# Patient Record
Sex: Female | Born: 1990
Health system: Southern US, Community
[De-identification: ages and names within clinical notes are randomized; demographics above are authoritative.]

## PROBLEM LIST (undated history)

## (undated) DIAGNOSIS — F411 Generalized anxiety disorder: Secondary | ICD-10-CM

## (undated) DIAGNOSIS — R350 Frequency of micturition: Secondary | ICD-10-CM

## (undated) DIAGNOSIS — F32A Depression, unspecified: Secondary | ICD-10-CM

## (undated) DIAGNOSIS — F329 Major depressive disorder, single episode, unspecified: Secondary | ICD-10-CM

## (undated) DIAGNOSIS — R351 Nocturia: Secondary | ICD-10-CM

## (undated) DIAGNOSIS — Z8619 Personal history of other infectious and parasitic diseases: Secondary | ICD-10-CM

## (undated) DIAGNOSIS — Z973 Presence of spectacles and contact lenses: Secondary | ICD-10-CM

## (undated) DIAGNOSIS — F419 Anxiety disorder, unspecified: Secondary | ICD-10-CM

## (undated) DIAGNOSIS — G35 Multiple sclerosis: Secondary | ICD-10-CM

## (undated) DIAGNOSIS — Z8742 Personal history of other diseases of the female genital tract: Secondary | ICD-10-CM

## (undated) DIAGNOSIS — N390 Urinary tract infection, site not specified: Secondary | ICD-10-CM

## (undated) DIAGNOSIS — D259 Leiomyoma of uterus, unspecified: Secondary | ICD-10-CM

## (undated) DIAGNOSIS — E282 Polycystic ovarian syndrome: Secondary | ICD-10-CM

## (undated) DIAGNOSIS — N941 Unspecified dyspareunia: Secondary | ICD-10-CM

## (undated) DIAGNOSIS — E559 Vitamin D deficiency, unspecified: Secondary | ICD-10-CM

## (undated) DIAGNOSIS — A749 Chlamydial infection, unspecified: Secondary | ICD-10-CM

## (undated) HISTORY — DX: Multiple sclerosis: G35

## (undated) HISTORY — DX: Major depressive disorder, single episode, unspecified: F32.9

## (undated) HISTORY — DX: Vitamin D deficiency, unspecified: E55.9

## (undated) HISTORY — DX: Anxiety disorder, unspecified: F41.9

## (undated) HISTORY — DX: Depression, unspecified: F32.A

## (undated) HISTORY — DX: Polycystic ovarian syndrome: E28.2

## (undated) HISTORY — DX: Chlamydial infection, unspecified: A74.9

## (undated) HISTORY — PX: NO PAST SURGERIES: SHX2092

## (undated) HISTORY — DX: Personal history of other diseases of the female genital tract: Z87.42

---
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
## 2014-01-23 ENCOUNTER — Ambulatory Visit: Payer: Self-pay | Admitting: Internal Medicine

## 2014-02-28 ENCOUNTER — Ambulatory Visit: Payer: Self-pay | Admitting: Emergency Medicine

## 2014-02-28 LAB — RAPID STREP-A WITH REFLX: Micro Text Report: POSITIVE

## 2015-03-04 DIAGNOSIS — G35 Multiple sclerosis: Secondary | ICD-10-CM

## 2015-03-04 HISTORY — DX: Multiple sclerosis: G35

## 2015-03-11 ENCOUNTER — Emergency Department (HOSPITAL_COMMUNITY)
Admission: EM | Admit: 2015-03-11 | Discharge: 2015-03-11 | Disposition: A | Discharge status: Home / Self Care | Payer: BLUE CROSS/BLUE SHIELD | Source: Home / Self Care | Attending: Family Medicine | Admitting: Family Medicine

## 2015-03-11 ENCOUNTER — Encounter (HOSPITAL_COMMUNITY): Payer: Self-pay | Admitting: Emergency Medicine

## 2015-03-11 DIAGNOSIS — R42 Dizziness and giddiness: Secondary | ICD-10-CM

## 2015-03-11 LAB — POCT I-STAT, CHEM 8
BUN: 7 mg/dL (ref 6–23)
CALCIUM ION: 1.23 mmol/L (ref 1.12–1.23)
CHLORIDE: 103 mmol/L (ref 96–112)
Creatinine, Ser: 0.7 mg/dL (ref 0.50–1.10)
GLUCOSE: 83 mg/dL (ref 70–99)
HEMATOCRIT: 44 % (ref 36.0–46.0)
Hemoglobin: 15 g/dL (ref 12.0–15.0)
Potassium: 3.3 mmol/L — ABNORMAL LOW (ref 3.5–5.1)
Sodium: 142 mmol/L (ref 135–145)
TCO2: 22 mmol/L (ref 0–100)

## 2015-03-11 LAB — POCT URINALYSIS DIP (DEVICE)
Bilirubin Urine: NEGATIVE
Glucose, UA: NEGATIVE mg/dL
Ketones, ur: NEGATIVE mg/dL
Leukocytes, UA: NEGATIVE
Nitrite: NEGATIVE
PROTEIN: NEGATIVE mg/dL
UROBILINOGEN UA: 0.2 mg/dL (ref 0.0–1.0)
pH: 7.5 (ref 5.0–8.0)

## 2015-03-11 LAB — POCT PREGNANCY, URINE: Preg Test, Ur: NEGATIVE

## 2015-03-11 NOTE — ED Notes (Signed)
C/o feeling light headed and dizzy onset Tuesday Sx also include HA and blurry vision [facility] her meclazine and zyrtec w/no relief Alert, no signs of acute distress.

## 2015-03-11 NOTE — Discharge Instructions (Signed)

## 2015-03-11 NOTE — ED Provider Notes (Signed)
CSN: [id number]     Arrival date & time 03/11/15  1206 History   First MD Initiated Contact with Patient 03/11/15 1258     Chief Complaint  Patient presents with  . Dizziness   (Consider location/radiation/quality/duration/timing/severity/associated sxs/prior Treatment) HPI      24 year old female presents complaining of lightheadedness and dizziness, and blurry vision with looking to the sides. This started on Tuesday, 3 days. Went to her school clinic yesterday and was prescribed meclizine and an allergy medicine, she took this last night and her symptoms were not totally gone this morning so she came in to be rechecked. She describes her symptoms as being very mild. She also occasionally gets a headache. She also admits to a significantly increased level of stress at school. No history of thyroid problems. She has a history of vertigo but that was much worse than she feels today. No SI or HI. No chest pain or shortness of breath  History reviewed. No pertinent past medical history. History reviewed. No pertinent past surgical history. No family history on file. History  Substance Use Topics  . Smoking status: Not on file  . Smokeless tobacco: Not on file  . Alcohol Use: Not on file   OB History    No data available     Review of Systems  Constitutional: Negative for fever and chills.  Eyes: Positive for visual disturbance. Negative for photophobia, pain and redness.  Respiratory: Negative for shortness of breath.   Cardiovascular: Negative for chest pain.  Gastrointestinal: Negative for nausea, vomiting and diarrhea.  Neurological: Positive for dizziness and headaches.  Psychiatric/Behavioral: The patient is nervous/anxious.   All other systems reviewed and are negative.   Allergies  Review of patient's allergies indicates no known allergies.  Home Medications   Prior to Admission medications   Medication Sig Start Date End Date Taking? Authorizing Provider  meclizine  (ANTIVERT) 12.5 MG tablet Take 12.5 mg by mouth 3 (three) times daily as needed for dizziness.   Yes Historical Provider, MD   BP 120/82 mmHg  Pulse 84  Temp(Src) 99.3 F (37.4 C) (Oral)  Resp 16  SpO2 100%  LMP 03/06/2015 Physical Exam  Constitutional: She is oriented to person, place, and time. Vital signs are normal. She appears well-developed and well-nourished. No distress.  HENT:  Head: Normocephalic and atraumatic.  Right Ear: External ear normal.  Left Ear: External ear normal.  Nose: Nose normal. Right sinus exhibits no maxillary sinus tenderness and no frontal sinus tenderness. Left sinus exhibits no maxillary sinus tenderness and no frontal sinus tenderness.  Mouth/Throat: Oropharynx is clear and moist. No oropharyngeal exudate.  Eyes: Conjunctivae are normal.  Neck: Normal range of motion. Neck supple.  Cardiovascular: Normal rate, regular rhythm and normal heart sounds.   Pulmonary/Chest: Effort normal and breath sounds normal. No respiratory distress.  Lymphadenopathy:    She has no cervical adenopathy.  Neurological: She is alert and oriented to person, place, and time. She has normal strength and normal reflexes. No cranial nerve deficit or sensory deficit. She exhibits normal muscle tone. She displays a negative Romberg sign. Coordination and gait normal.  Skin: Skin is warm and dry. No rash noted. She is not diaphoretic.  Psychiatric: She has a normal mood and affect. Judgment normal.  Nursing note and vitals reviewed.   ED Course  ED EKG  Date/Time: 03/11/2015 2:05 PM Performed by: Graylon Good Authorized by: Autumn Messing H Comparison: not compared with previous ECG  Rhythm: sinus rhythm  Rate: normal QRS axis: normal Conduction: conduction normal ST Segments: ST segments normal T Waves: T waves normal Other: no other findings Clinical impression: normal ECG   (including critical care time) Labs Review Labs Reviewed  POCT I-STAT, CHEM 8 -  Abnormal; Notable for the following:    Potassium 3.3 (*)    All other components within normal limits  POCT URINALYSIS DIP (DEVICE) - Abnormal; Notable for the following:    Hgb urine dipstick MODERATE (*)    All other components within normal limits    Imaging Review No results found.   MDM   1. Dizziness    The physical exam and workup here today is normal. I believe she should take the medication that she was prescribed yesterday. Also stress may be a concerning factor, we discussed strategies to deal with stress.    Graylon Good, PA-C 03/11/15 1405  Graylon Good, PA-C 03/11/15 [phone]

## 2015-03-12 ENCOUNTER — Encounter (HOSPITAL_COMMUNITY): Payer: Self-pay | Admitting: Emergency Medicine

## 2015-03-12 DIAGNOSIS — Z3202 Encounter for pregnancy test, result negative: Secondary | ICD-10-CM | POA: Insufficient documentation

## 2015-03-12 DIAGNOSIS — Z793 Long term (current) use of hormonal contraceptives: Secondary | ICD-10-CM | POA: Diagnosis not present

## 2015-03-12 DIAGNOSIS — H6692 Otitis media, unspecified, left ear: Secondary | ICD-10-CM | POA: Diagnosis not present

## 2015-03-12 DIAGNOSIS — R109 Unspecified abdominal pain: Secondary | ICD-10-CM | POA: Diagnosis not present

## 2015-03-12 DIAGNOSIS — Z79899 Other long term (current) drug therapy: Secondary | ICD-10-CM | POA: Insufficient documentation

## 2015-03-12 DIAGNOSIS — R42 Dizziness and giddiness: Secondary | ICD-10-CM | POA: Diagnosis present

## 2015-03-12 LAB — CBC WITH DIFFERENTIAL/PLATELET
BASOS PCT: 1 % (ref 0–1)
Basophils Absolute: 0.1 10*3/uL (ref 0.0–0.1)
EOS PCT: 3 % (ref 0–5)
Eosinophils Absolute: 0.2 10*3/uL (ref 0.0–0.7)
HEMATOCRIT: 39.2 % (ref 36.0–46.0)
Hemoglobin: 13.5 g/dL (ref 12.0–15.0)
Lymphocytes Relative: 31 % (ref 12–46)
Lymphs Abs: 3 10*3/uL (ref 0.7–4.0)
MCH: 31.3 pg (ref 26.0–34.0)
MCHC: 34.4 g/dL (ref 30.0–36.0)
MCV: 91 fL (ref 78.0–100.0)
Monocytes Absolute: 0.5 10*3/uL (ref 0.1–1.0)
Monocytes Relative: 5 % (ref 3–12)
NEUTROS ABS: 5.7 10*3/uL (ref 1.7–7.7)
Neutrophils Relative %: 60 % (ref 43–77)
PLATELETS: 270 10*3/uL (ref 150–400)
RBC: 4.31 MIL/uL (ref 3.87–5.11)
RDW: 12 % (ref 11.5–15.5)
WBC: 9.5 10*3/uL (ref 4.0–10.5)

## 2015-03-12 LAB — COMPREHENSIVE METABOLIC PANEL
ALT: 16 U/L (ref 0–35)
ANION GAP: 10 (ref 5–15)
AST: 19 U/L (ref 0–37)
Albumin: 3.8 g/dL (ref 3.5–5.2)
Alkaline Phosphatase: 39 U/L (ref 39–117)
BILIRUBIN TOTAL: 0.5 mg/dL (ref 0.3–1.2)
BUN: 8 mg/dL (ref 6–23)
CALCIUM: 9 mg/dL (ref 8.4–10.5)
CO2: 25 mmol/L (ref 19–32)
CREATININE: 0.73 mg/dL (ref 0.50–1.10)
Chloride: 104 mmol/L (ref 96–112)
GFR calc non Af Amer: >90 mL/min (ref >90)
GLUCOSE: 119 mg/dL — AB (ref 70–99)
Potassium: 3.1 mmol/L — ABNORMAL LOW (ref 3.5–5.1)
Sodium: 139 mmol/L (ref 135–145)
Total Protein: 6.4 g/dL (ref 6.0–8.3)

## 2015-03-12 LAB — LIPASE, BLOOD: LIPASE: 23 U/L (ref 11–59)

## 2015-03-12 NOTE — ED Notes (Signed)
Patient here with complaint of dizziness starting Tuesday of this week. States that she has had vertigo before and headaches. States that she went to urgent care yesterday for the dizziness, was told it is likely related to her allergies, and was discharged with meclizine. Reports that she took her doses today as prescribed with no relief. Dizziness is worsened by laying down, denies mitigating factors. Additionally reports diffuse abdominal accompanied by bright green bowel movement today.

## 2015-03-13 ENCOUNTER — Emergency Department (HOSPITAL_COMMUNITY)
Admission: EM | Admit: 2015-03-13 | Discharge: 2015-03-13 | Disposition: A | Discharge status: Home / Self Care | Payer: BLUE CROSS/BLUE SHIELD | Attending: Emergency Medicine | Admitting: Emergency Medicine

## 2015-03-13 DIAGNOSIS — H6692 Otitis media, unspecified, left ear: Secondary | ICD-10-CM

## 2015-03-13 LAB — URINALYSIS, ROUTINE W REFLEX MICROSCOPIC
Bilirubin Urine: NEGATIVE
Glucose, UA: NEGATIVE mg/dL
Hgb urine dipstick: NEGATIVE
Ketones, ur: NEGATIVE mg/dL
LEUKOCYTES UA: NEGATIVE
NITRITE: NEGATIVE
PH: 7.5 (ref 5.0–8.0)
PROTEIN: NEGATIVE mg/dL
Specific Gravity, Urine: 1.006 (ref 1.005–1.030)
Urobilinogen, UA: 0.2 mg/dL (ref 0.0–1.0)

## 2015-03-13 LAB — PREGNANCY, URINE: Preg Test, Ur: NEGATIVE

## 2015-03-13 MED ORDER — AMOXICILLIN 500 MG PO CAPS
500.0000 mg | ORAL_CAPSULE | Freq: Once | ORAL | Status: AC
  Administered 2015-03-13: 500 mg via ORAL
  Filled 2015-03-13: qty 1

## 2015-03-13 MED ORDER — AMOXICILLIN 500 MG PO CAPS: 500.0000 mg | ORAL_CAPSULE | Freq: Three times a day (TID) | ORAL | Status: DC

## 2015-03-13 MED ORDER — DIAZEPAM 5 MG PO TABS
5.0000 mg | ORAL_TABLET | Freq: Once | ORAL | Status: AC
Start: 2015-03-13 — End: 2015-03-13
  Administered 2015-03-13: 5 mg via ORAL
  Filled 2015-03-13: qty 1

## 2015-03-13 NOTE — Discharge Instructions (Signed)
Take amoxicillin as directed until gone. Refer to attached documents for more information.  °

## 2015-03-13 NOTE — ED Provider Notes (Signed)
CSN: [id number]     Arrival date & time 03/12/15  2152 History   First MD Initiated Contact with Patient 03/13/15 0032     Chief Complaint  Patient presents with  . Dizziness  . Abdominal Pain     (Consider location/radiation/quality/duration/timing/severity/associated sxs/prior Treatment) Patient is a 24 y.o. female presenting with dizziness. The history is provided by the patient. No language interpreter was used.  Dizziness Quality:  Lightheadedness and vertigo Severity:  Moderate Onset quality:  Gradual Duration:  5 days Timing:  Intermittent Progression:  Unchanged Chronicity:  New Context: bending over, ear pain and head movement   Context: not with loss of consciousness, not with physical activity, not when standing up and not when urinating   Relieved by:  Being still Worsened by:  Movement Ineffective treatments:  Medication Associated symptoms: no chest pain, no diarrhea, no nausea, no palpitations, no shortness of breath, no vomiting and no weakness   Risk factors: no anemia, no heart disease, no hx of stroke, no hx of vertigo, no Meniere's disease, no multiple medications and no new medications     History reviewed. No pertinent past medical history. History reviewed. No pertinent past surgical history. History reviewed. No pertinent family history. History  Substance Use Topics  . Smoking status: Never Smoker   . Smokeless tobacco: Not on file  . Alcohol Use: No   OB History    No data available     Review of Systems  Constitutional: Negative for fever, chills and fatigue.  HENT: Positive for ear pain. Negative for trouble swallowing.   Eyes: Negative for visual disturbance.  Respiratory: Negative for shortness of breath.   Cardiovascular: Negative for chest pain and palpitations.  Gastrointestinal: Negative for nausea, vomiting, abdominal pain and diarrhea.  Genitourinary: Negative for dysuria and difficulty urinating.  Musculoskeletal: Negative for  arthralgias and neck pain.  Skin: Negative for color change.  Neurological: Positive for dizziness and light-headedness. Negative for weakness.  Psychiatric/Behavioral: Negative for dysphoric mood.      Allergies  Review of patient's allergies indicates no known allergies.  Home Medications   Prior to Admission medications   Medication Sig Start Date End Date Taking? Authorizing Provider  drospirenone-ethinyl estradiol (YASMIN,ZARAH,SYEDA) 3-0.03 MG tablet Take 1 tablet by mouth daily.   Yes Historical Provider, MD  meclizine (ANTIVERT) 25 MG tablet Take 25 mg by mouth 3 (three) times daily as needed for dizziness.   Yes Historical Provider, MD   BP 110/76 mmHg  Pulse 93  Temp(Src) 98.3 F (36.8 C) (Oral)  Resp 27  SpO2 100%  LMP 03/06/2015 (Exact Date) Physical Exam  Constitutional: She is oriented to person, place, and time. She appears well-developed and well-nourished. No distress.  HENT:  Head: Normocephalic and atraumatic.  Right Ear: External ear normal.  Left Ear: External ear normal.  Mouth/Throat: Oropharynx is clear and moist. No oropharyngeal exudate.  Bilateral TM intact without erythema.   Eyes: Conjunctivae and EOM are normal.  Neck: Normal range of motion.  Cardiovascular: Normal rate and regular rhythm.  Exam reveals no gallop and no friction rub.   No murmur heard. Pulmonary/Chest: Effort normal and breath sounds normal. She has no wheezes. She has no rales. She exhibits no tenderness.  Abdominal: Soft. She exhibits no distension. There is no tenderness. There is no rebound.  Musculoskeletal: Normal range of motion.  Neurological: She is alert and oriented to person, place, and time. Coordination normal.  Speech is goal-oriented. Moves limbs without ataxia.  Skin: Skin is warm and dry.  Psychiatric: She has a normal mood and affect. Her behavior is normal.  Nursing note and vitals reviewed.   ED Course  Procedures (including critical care time) Labs  Review Labs Reviewed  COMPREHENSIVE METABOLIC PANEL - Abnormal; Notable for the following:    Potassium 3.1 (*)    Glucose, Bld 119 (*)    All other components within normal limits  LIPASE, BLOOD  CBC WITH DIFFERENTIAL/PLATELET  URINALYSIS, ROUTINE W REFLEX MICROSCOPIC  PREGNANCY, URINE    Imaging Review No results found.   EKG Interpretation None      MDM   Final diagnoses:  Acute left otitis media, recurrence not specified, unspecified otitis media type    3:10 AM Patient likely has otitis media on the left and will be discharged with amoxicillin. Labs and urinalysis unremarkable for acute changes. Vitals stable and patient afebrile. No abdominal pain at this time.    [address] [place], PA-C 03/13/15 [id number]  Loren Racer, MD 03/13/15 [phone]

## 2015-03-31 ENCOUNTER — Encounter (HOSPITAL_COMMUNITY): Payer: Self-pay | Admitting: Emergency Medicine

## 2015-03-31 ENCOUNTER — Telehealth (HOSPITAL_BASED_OUTPATIENT_CLINIC_OR_DEPARTMENT_OTHER): Payer: Self-pay | Admitting: Emergency Medicine

## 2015-03-31 ENCOUNTER — Emergency Department (HOSPITAL_COMMUNITY): Payer: BLUE CROSS/BLUE SHIELD

## 2015-03-31 ENCOUNTER — Emergency Department (HOSPITAL_COMMUNITY)
Admission: EM | Admit: 2015-03-31 | Discharge: 2015-03-31 | Disposition: A | Discharge status: Home / Self Care | Payer: BLUE CROSS/BLUE SHIELD | Attending: Emergency Medicine | Admitting: Emergency Medicine

## 2015-03-31 DIAGNOSIS — R51 Headache: Secondary | ICD-10-CM | POA: Insufficient documentation

## 2015-03-31 DIAGNOSIS — H9192 Unspecified hearing loss, left ear: Secondary | ICD-10-CM | POA: Insufficient documentation

## 2015-03-31 DIAGNOSIS — Z3202 Encounter for pregnancy test, result negative: Secondary | ICD-10-CM | POA: Diagnosis not present

## 2015-03-31 DIAGNOSIS — R200 Anesthesia of skin: Secondary | ICD-10-CM | POA: Diagnosis present

## 2015-03-31 DIAGNOSIS — G35 Multiple sclerosis: Secondary | ICD-10-CM | POA: Insufficient documentation

## 2015-03-31 DIAGNOSIS — Z79899 Other long term (current) drug therapy: Secondary | ICD-10-CM | POA: Insufficient documentation

## 2015-03-31 DIAGNOSIS — H938X3 Other specified disorders of ear, bilateral: Secondary | ICD-10-CM | POA: Insufficient documentation

## 2015-03-31 LAB — COMPREHENSIVE METABOLIC PANEL
ALBUMIN: 3.7 g/dL (ref 3.5–5.2)
ALK PHOS: 38 U/L — AB (ref 39–117)
ALT: 16 U/L (ref 0–35)
ANION GAP: 8 (ref 5–15)
AST: 20 U/L (ref 0–37)
CO2: 23 mmol/L (ref 19–32)
CREATININE: 0.7 mg/dL (ref 0.50–1.10)
Calcium: 9.3 mg/dL (ref 8.4–10.5)
Chloride: 109 mmol/L (ref 96–112)
Glucose, Bld: 94 mg/dL (ref 70–99)
POTASSIUM: 3.5 mmol/L (ref 3.5–5.1)
Sodium: 140 mmol/L (ref 135–145)
TOTAL PROTEIN: 6.6 g/dL (ref 6.0–8.3)
Total Bilirubin: 0.5 mg/dL (ref 0.3–1.2)

## 2015-03-31 LAB — URINALYSIS, ROUTINE W REFLEX MICROSCOPIC
Bilirubin Urine: NEGATIVE
GLUCOSE, UA: NEGATIVE mg/dL
Hgb urine dipstick: NEGATIVE
Ketones, ur: NEGATIVE mg/dL
LEUKOCYTES UA: NEGATIVE
NITRITE: NEGATIVE
Protein, ur: NEGATIVE mg/dL
SPECIFIC GRAVITY, URINE: 1.022 (ref 1.005–1.030)
Urobilinogen, UA: 0.2 mg/dL (ref 0.0–1.0)
pH: 8 (ref 5.0–8.0)

## 2015-03-31 LAB — CBC WITH DIFFERENTIAL/PLATELET
BASOS PCT: 1 % (ref 0–1)
Basophils Absolute: 0 10*3/uL (ref 0.0–0.1)
EOS PCT: 2 % (ref 0–5)
Eosinophils Absolute: 0.1 10*3/uL (ref 0.0–0.7)
HEMATOCRIT: 38.6 % (ref 36.0–46.0)
HEMOGLOBIN: 13.4 g/dL (ref 12.0–15.0)
LYMPHS ABS: 1.7 10*3/uL (ref 0.7–4.0)
Lymphocytes Relative: 28 % (ref 12–46)
MCH: 31.5 pg (ref 26.0–34.0)
MCHC: 34.7 g/dL (ref 30.0–36.0)
MCV: 90.6 fL (ref 78.0–100.0)
MONO ABS: 0.3 10*3/uL (ref 0.1–1.0)
MONOS PCT: 4 % (ref 3–12)
Neutro Abs: 4 10*3/uL (ref 1.7–7.7)
Neutrophils Relative %: 65 % (ref 43–77)
Platelets: 265 10*3/uL (ref 150–400)
RBC: 4.26 MIL/uL (ref 3.87–5.11)
RDW: 12.1 % (ref 11.5–15.5)
WBC: 6.1 10*3/uL (ref 4.0–10.5)

## 2015-03-31 LAB — TROPONIN I: Troponin I: <0.03 ng/mL (ref <0.031)

## 2015-03-31 LAB — POC URINE PREG, ED: PREG TEST UR: NEGATIVE

## 2015-03-31 LAB — LIPASE, BLOOD: Lipase: 26 U/L (ref 11–59)

## 2015-03-31 MED ORDER — GADOBENATE DIMEGLUMINE 529 MG/ML IV SOLN: 20.0000 mL | Freq: Once | INTRAVENOUS | Status: DC

## 2015-03-31 MED ORDER — SODIUM CHLORIDE 0.9 % IV BOLUS (SEPSIS)
1000.0000 mL | Freq: Once | INTRAVENOUS | Status: AC
  Administered 2015-03-31: 1000 mL via INTRAVENOUS

## 2015-03-31 NOTE — ED Notes (Signed)
Patient transported to MRI 

## 2015-03-31 NOTE — Discharge Instructions (Signed)
Please call your doctor for a followup appointment within 24-48 hours. When you talk to your doctor please let them know that you were seen in the emergency department and have them acquire all of your records so that they can discuss the findings with you and formulate a treatment plan to fully care for your new and ongoing problems. Please call and set-up an appointment with health and wellness center Please call and set-up an appointment with Neurology Please rest and stay hydrated Please continue to monitor symptoms closely and if symptoms are to worsen or change (fever greater than 101, chills, sweating, nausea, vomiting, chest pain, shortness of breathe, difficulty breathing, weakness, numbness, tingling, worsening or changes to pain pattern, swelling, fall, inability to walk, visual changes) please report back to the Emergency Department immediately.    Multiple Sclerosis Multiple sclerosis (MS) is a disease of the central nervous system. It leads to the loss of the insulating covering of the nerves (myelin sheath) of your brain. When this happens, brain signals do not get sent properly or may not get sent at all. The age of onset of MS varies.  CAUSES The cause of MS is unknown. However, it is more common in the [place] than in the [place]. RISK FACTORS There is a higher number of women with MS than men. MS is not an illness that is passed down to you from your family members (inherited). However, your risk of MS is higher if you have a relative with MS. SIGNS AND SYMPTOMS  The symptoms of MS occur in episodes or attacks. These attacks may last weeks to months. There may be Josten periods of almost no symptoms between attacks. The symptoms of MS vary. This is because of the many different ways it affects the central nervous system. The main symptoms of MS include:  Vision problems and eye pain.  Numbness.  Weakness.  Inability to move your arms, hands, feet,  or legs (paralysis).  Balance problems.  Tremors. DIAGNOSIS  Your health care provider can diagnose MS with the help of imaging exams and lab tests. These may include specialized X-ray exams and spinal fluid tests. The best imaging exam to confirm a diagnosis of MS is an MRI. TREATMENT  There is no known cure for MS, but there are medicines that can decrease the number and frequency of attacks. Steroids are often used for short-term relief. Physical and occupational therapy may also help. There are also many new alternative or complementary treatments available to help control the symptoms of MS. Ask your health care provider if any of these other options are right for you. HOME CARE INSTRUCTIONS   Take medicines as directed by your health care provider.  Exercise as directed by your health care provider. SEEK MEDICAL CARE IF: You begin to feel depressed. SEEK IMMEDIATE MEDICAL CARE IF:  You develop paralysis.  You have problems with bladder, bowel, or sexual function.  You develop mental changes, such as forgetfulness or mood swings.  You have a period of uncontrolled movements (seizure). Document Released: 11/16/2000 Document Revised: 11/24/2013 Document Reviewed: 07/27/2013 [facility] Patient Information 2015 [place], [place]. This information is not intended to replace advice given to you by your health care provider. Make sure you discuss any questions you have with your health care provider.   Emergency Department Resource Guide 1) Find a Doctor and Pay Out of Pocket Although you won't have to find out who is covered by your insurance plan, it is a  good idea to ask around and get recommendations. You will then need to call the office and see if the doctor you have chosen will accept you as a new patient and what types of options they offer for patients who are self-pay. Some doctors offer discounts or will set up payment plans for their patients who do not have insurance, but you  will need to ask so you aren't surprised when you get to your appointment.  2) Contact Your Local Health Department Not all health departments have doctors that can see patients for sick visits, but many do, so it is worth a call to see if yours does. If you don't know where your local health department is, you can check in your phone book. The CDC also has a tool to help you locate your state's health department, and many state websites also have listings of all of their local health departments.  3) Find a Walk-in Clinic If your illness is not likely to be very severe or complicated, you may want to try a walk in clinic. These are popping up all over the country in pharmacies, drugstores, and shopping centers. They're usually staffed by nurse practitioners or physician assistants that have been trained to treat common illnesses and complaints. They're usually fairly quick and inexpensive. However, if you have serious medical issues or chronic medical problems, these are probably not your best option.  No Primary Care Doctor: - Call Health Connect at  [phone] - they can help you locate a primary care doctor that  accepts your insurance, provides certain services, etc. - Physician Referral Service- [phone]  Chronic Pain Problems: Organization         Address  Phone   Notes  [facility]  [phone] Patients need to be referred by their primary care doctor.   Medication Assistance: Organization         Address  Phone   Notes  [facility] Medication [facility] [address] [place], [place] [address] [phone] --Must be a resident of [facility] -- Must have NO insurance coverage whatsoever (no Medicaid/ Medicare, etc.) -- The pt. MUST have a primary care doctor that directs their care regularly and follows them in the community   [facility]  [phone]   [employer]  [phone]    Agencies that provide inexpensive medical  care: Organization         Address  Phone   Notes  [facility]  [phone]   [facility]    [phone]   [facility] [address] [place], [place] [address] [phone]   [facility] [address] [place], [place] [phone]   Planned Parenthood    [phone]   [facility]    [phone]   [facility] and [facility]  [address], [place] Phone:  [phone], Fax:  [phone] Hours of Operation:  9 am - 6 pm, M-F.  Also accepts Medicaid/Medicare and self-pay.  [facility]  [address], [place] Phone: [phone], Fax: [phone]. Hours of Operation:  8:30 am - 5:30 pm, M-F.  Also accepts Medicaid and self-pay.  [facility] [place] [address] [place] Phone: [phone]   [facility] [address] Natasha Bence [place], [place] [phone], Ext. [phone] Mondays & Thursdays: 7-9 AM.  First  15 patients are seen on a first come, first serve basis.    Medicaid-accepting [facility] Providers:  Organization         Address  Phone   Notes  [facility] [address], [place] [phone] Also accepts self-pay patients.  [facility] [address] Laurell Josephs [place], [place]  [phone]   [facility] [address], [place] [phone]   [facility] 352 Family Medicine [address], [place] [phone]   Renaye Rakers [address], [place]   [phone] Only accepts [place] Access [place] patients after they have their name applied to their card.   Self-Pay (no insurance) in [facility]:  Organization         Address  Phone   Notes  Sickle Cell Patients, [facility] Internal Medicine [address] [place], [place] [phone]   [facility] [address] [place],  [place] [phone]   [facility] [place]  [address], [place] [phone]   [facility]/Dr. Osei-Bonsu  [address], [place] or [address], [place] [phone] Phone number for both [place] and [place] locations is the same.  [facility] and [facility] [address] [place] [phone]   [facility] [address] [place] or [address] [phone]   [facility] [address] [place] [phone], phone; [phone], fax Sees patients 1st and 3rd Saturday of every month.  Must not qualify for public or private insurance (i.e. Medicaid, Medicare, [place] Health Choice, Veterans' Benefits)  Household income should be no more than 200% of the poverty level The clinic cannot treat you if you are pregnant or think you are pregnant  Sexually transmitted diseases are not treated at the clinic.    Dental Care: Organization         Address  Phone  Notes  [facility] Department of [facility] [address] [place], [place] [phone] Accepts children up to age 63 who are enrolled in [place] or [place] Health Choice; pregnant women with a Medicaid card; and children who have applied for Medicaid or [place] Health Choice, but were declined, whose parents can pay a reduced fee at time of service.  [facility] Department of [facility]  [address], [place] [phone] Accepts children up to age 39 who are enrolled in [place] or [place] Health Choice; pregnant women with a Medicaid card; and children who have applied for Medicaid or [place] Health Choice, but were declined, whose parents can pay a reduced fee at time of service.  [facility]  [address] [place], [place] [phone] Patients are seen by appointment only. Walk-ins are not accepted. [facility] will see patients 40 years of age and older. Monday - Tuesday (8am-5pm) Most Wednesdays (8:30-5pm) $30 per visit, cash only  [facility]  [address], [facility] [phone] Patients are seen by appointment only. Walk-ins are not accepted. [facility] will see patients 58 years of age and older. One Wednesday Evening (Monthly: Volunteer Based).  $30 per visit, cash only  [employer]  [phone] for adults; Children under age 2, call Graduate Pediatric  Dentistry at [phone]. Children aged 46-14, please call [phone] to request a pediatric application.  Dental services are provided in all areas of dental care including fillings, crowns and bridges, complete and partial dentures, implants, gum treatment, root canals, and extractions. Preventive care is also provided. Treatment is provided to both adults and children. Patients are selected via a lottery and there is often a waiting list.   [facility] [address], [place]  [phone] [email or web address]   [facility] [address] [place], [place] [phone], Ext. [phone] Second and Fourth Thursday of each month, opens at 6:30 AM; Clinic ends at 9 AM.  Patients are seen on a first-come first-served basis, and a limited number are seen during each clinic.   [facility]  [address] Ether Griffins [place], [place] [phone]   Eligibility Requirements You must have lived in [place], [place], or [place] counties for at least the last three months.   You cannot be eligible for state or federal sponsored [place], including [employer], [place], or [employer].   You generally cannot be eligible for healthcare insurance through your employer.    How to apply: Eligibility screenings are held every Tuesday and Wednesday afternoon from 1:00 pm until 4:00 pm. You do not need an appointment for the interview!   [facility] [address] [place], [place] [phone]   [facility] Health Department  [phone]   [facility] Health Department  [phone]   [facility] Health Department  [phone]    Behavioral Health Resources in the Community: Intensive Outpatient Programs Organization         Address  Phone  Notes  [facility] [address] [place], [place] [phone]   [facility] Outpatient [address], [place], [place] [phone]   [facility] [address] [place], [place]  [phone]   [facility] Mental Health [address],  [place], [place] [phone] or [phone]   Substance Abuse Resources Organization         Address  Phone  Notes  [facility] Drug [facility]  [phone]   [facility]  [phone]   [facility]  [phone]   Floydene Flock  [phone]   Residential & Outpatient Substance Abuse Program  [phone]   Psychological Services Organization         Address  Phone  Notes  [facility] Behavioral Health  [phone]   [facility]  [phone]   [facility] Mental Health [address] [place] [phone] or [phone]    Mobile Crisis Teams Organization         Address  Phone  Notes  [facility], Mobile Crisis Care Unit  [phone]   [facility]  [address] [place], [place] [phone]   Doristine Locks [address] [place] [phone]    Self-Help/Support Groups Organization         Address  Phone             Notes  [facility] [place] - variety of support groups  336- I7437963 Call for more information  Narcotics Anonymous (NA), [facility] [address], [employer] [place]  2 meetings at this location   Statistician         Address  Phone  Notes  [facility] [address],  [place]  [place]  [address] [place] [id number], [place], [place]   [place], [place] [phone] Admissions: 8am-3pm M-F  Incentives Substance [place] [address]    [place], [place] [phone]   [facility] [address] [place], [place], [place]   [facility] [address],  [place], [place]   [facility] - Intensive Outpatient [address] Kristeen [place] [address], [place], [place]   [facility] [place].,  [place], [place] [phone] or [phone]   [facility] ([facility]) [address], [place], [place] Accepts Medicaid  [facility] [place] [address],  [place] [phone] Substance Abuse/Addiction Treatment   [facility] Organization         Address  Phone  Notes  [facility]  7571055668   Domenic Schwab, PhD [address] Arlis [place], [place]   [phone] or [phone]   [place] [address] [place], [place] [phone]   [facility] [address], [place], [place] [phone] Insurance/Medicaid/sponsorship through [facility] and Families [address] [place]                                    [place], [place] [phone] [place] [address], [place] [phone]    Dr. Adele Schilder  [phone]   [facility] Dept. 1) [address] [place] 2) [place] 3)  [place] [address], [place] [phone]  [phone]   [place] [phone] or [phone] (After Hours)

## 2015-03-31 NOTE — ED Provider Notes (Signed)
CSN: [id number]     Arrival date & time 03/31/15  0932 History   First MD Initiated Contact with Patient 03/31/15 [phone]     Chief Complaint  Patient presents with  . Numbness  . Ear Fullness     (Consider location/radiation/quality/duration/timing/severity/associated sxs/prior Treatment) The history is provided by the patient. No language interpreter was used.  Tracey Lindsey is a 24 y/o F with no known significant PMHx presenting to the ED with left ear fullness and numbness. Patient reported that 1 week ago she started to experience dizziness and was seen in the ED, diagnosed with otitis media where she was treated with amoxicillin. Stated that after taking the amoxicillin she started to noticed that her ear and her dizziness subsided. Patient reported that then she started to notice the dizziness throughout the past couple of days, stated that when she walks at times she does feel off balance. Reported that 6 days ago she started to notice left ear fullness and intermittent episodes of discomfort that were subtle. Reported that 3 days ago she noticed numbness and paresthesias within her left foot at approximately 8:00PM. Reported that next morning her left hand was numb and paresthesias were present. Reported that her left leg began numb with paresthesias this morning. Patient reported that she became concerned and wanted to come in and be assess. Reported that her last menstrual period was last month. Denied blurred vision, sudden loss of vision, difficulty swallowing, neck pain, neck stiffness, fever, chills, chest pain, shortness of breath, difficulty breathing, hemoptysis, cough, nasal congestion, urinary and bowel incontinence, back pain, confusion, speech issues, nausea, vomiting, diarrhea, melena, hematochezia, urinary symptoms, thumb pain, head injury, travels, leg swelling, change to medication. PCP none  History reviewed. No pertinent past medical history. History reviewed. No pertinent  past surgical history. History reviewed. No pertinent family history. History  Substance Use Topics  . Smoking status: Never Smoker   . Smokeless tobacco: Not on file  . Alcohol Use: No   OB History    No data available     Review of Systems  Constitutional: Negative for fever and chills.  HENT: Positive for hearing loss (left). Negative for sore throat and trouble swallowing.   Eyes: Negative for visual disturbance.  Respiratory: Negative for chest tightness and shortness of breath.   Cardiovascular: Negative for chest pain.  Gastrointestinal: Negative for nausea, vomiting and abdominal pain.  Musculoskeletal: Negative for back pain, neck pain and neck stiffness.  Neurological: Positive for dizziness, numbness and headaches. Negative for speech difficulty and weakness.      Allergies  Review of patient's allergies indicates no known allergies.  Home Medications   Prior to Admission medications   Medication Sig Start Date End Date Taking? Authorizing Provider  budesonide (RHINOCORT AQUA) 32 MCG/ACT nasal spray Place 2 sprays into both nostrils daily.   Yes Historical Provider, MD  drospirenone-ethinyl estradiol (YASMIN,ZARAH,SYEDA) 3-0.03 MG tablet Take 1 tablet by mouth daily.   Yes Historical Provider, MD  fluticasone (CUTIVATE) 0.05 % cream Apply 1 application topically daily as needed.   Yes Historical Provider, MD  loratadine (CLARITIN) 10 MG tablet Take 10 mg by mouth daily.   Yes Historical Provider, MD  meclizine (ANTIVERT) 25 MG tablet Take 25 mg by mouth 3 (three) times daily as needed for dizziness.    Historical Provider, MD   BP 124/75 mmHg  Pulse 73  Temp(Src) 98.4 F (36.9 C) (Oral)  Resp 17  Wt 200 lb (90.719 kg)  SpO2  100%  LMP 03/06/2015 (Exact Date) Physical Exam  Constitutional: She is oriented to person, place, and time. She appears well-developed and well-nourished. No distress.  HENT:  Head: Normocephalic and atraumatic.  Right Ear: Hearing,  tympanic membrane, external ear and ear canal normal. No mastoid tenderness.  Left Ear: Hearing, tympanic membrane, external ear and ear canal normal. No mastoid tenderness.  Mouth/Throat: Oropharynx is clear and moist. No oropharyngeal exudate.  Eyes: Conjunctivae and EOM are normal. Pupils are equal, round, and reactive to light. Right eye exhibits no discharge. Left eye exhibits no discharge.  Neck: Normal range of motion. Neck supple. No tracheal deviation present.  Cardiovascular: Normal rate, regular rhythm and normal heart sounds.  Exam reveals no friction rub.   No murmur heard. Pulmonary/Chest: Effort normal and breath sounds normal. No respiratory distress. She has no wheezes. She has no rales.  Musculoskeletal: Normal range of motion.  Full ROM to upper and lower extremities without difficulty noted, negative ataxia noted.  Lymphadenopathy:    She has no cervical adenopathy.  Neurological: She is alert and oriented to person, place, and time. No cranial nerve deficit. She exhibits normal muscle tone. Coordination normal. GCS eye subscore is 4. GCS verbal subscore is 5. GCS motor subscore is 6.  Reflex Scores:      Patellar reflexes are 2+ on the right side and 2+ on the left side. Cranial nerves III-XII grossly intact Strength 5+/5+ to upper and lower extremities bilaterally with resistance applied, equal distribution noted Equal grip strength  Negative facial droop  Negative slurred speech  Negative aphasia Patient is able to bring finger to nose bilaterally without difficulty or ataxia Negative arm drift Fine motor skills intact Patient is able to walk on toes, heel to toe Gait proper, proper balance - negative sway, negative drift, negative step-offs  Skin: Skin is warm and dry. No rash noted. She is not diaphoretic. No erythema.  Psychiatric: She has a normal mood and affect. Her behavior is normal. Thought content normal.  Vitals reviewed.   ED Course  Procedures  (including critical care time)  Results for orders placed or performed during the hospital encounter of 03/31/15  CBC with Differential/Platelet  Result Value Ref Range   WBC 6.1 4.0 - 10.5 K/uL   RBC 4.26 3.87 - 5.11 MIL/uL   Hemoglobin 13.4 12.0 - 15.0 g/dL   HCT 16.1 09.6 - 04.5 %   MCV 90.6 78.0 - 100.0 fL   MCH 31.5 26.0 - 34.0 pg   MCHC 34.7 30.0 - 36.0 g/dL   RDW 40.9 81.1 - 91.4 %   Platelets 265 150 - 400 K/uL   Neutrophils Relative % 65 43 - 77 %   Neutro Abs 4.0 1.7 - 7.7 K/uL   Lymphocytes Relative 28 12 - 46 %   Lymphs Abs 1.7 0.7 - 4.0 K/uL   Monocytes Relative 4 3 - 12 %   Monocytes Absolute 0.3 0.1 - 1.0 K/uL   Eosinophils Relative 2 0 - 5 %   Eosinophils Absolute 0.1 0.0 - 0.7 K/uL   Basophils Relative 1 0 - 1 %   Basophils Absolute 0.0 0.0 - 0.1 K/uL  Comprehensive metabolic panel  Result Value Ref Range   Sodium 140 135 - 145 mmol/L   Potassium 3.5 3.5 - 5.1 mmol/L   Chloride 109 96 - 112 mmol/L   CO2 23 19 - 32 mmol/L   Glucose, Bld 94 70 - 99 mg/dL   BUN <5 (L) 6 -  23 mg/dL   Creatinine, Ser 3.16 0.50 - 1.10 mg/dL   Calcium 9.3 8.4 - 74.2 mg/dL   Total Protein 6.6 6.0 - 8.3 g/dL   Albumin 3.7 3.5 - 5.2 g/dL   AST 20 0 - 37 U/L   ALT 16 0 - 35 U/L   Alkaline Phosphatase 38 (L) 39 - 117 U/L   Total Bilirubin 0.5 0.3 - 1.2 mg/dL   GFR calc non Af Amer >90 >90 mL/min   GFR calc Af Amer >90 >90 mL/min   Anion gap 8 5 - 15  Lipase, blood  Result Value Ref Range   Lipase 26 11 - 59 U/L  Urinalysis, Routine w reflex microscopic  Result Value Ref Range   Color, Urine YELLOW YELLOW   APPearance CLEAR CLEAR   Specific Gravity, Urine 1.022 1.005 - 1.030   pH 8.0 5.0 - 8.0   Glucose, UA NEGATIVE NEGATIVE mg/dL   Hgb urine dipstick NEGATIVE NEGATIVE   Bilirubin Urine NEGATIVE NEGATIVE   Ketones, ur NEGATIVE NEGATIVE mg/dL   Protein, ur NEGATIVE NEGATIVE mg/dL   Urobilinogen, UA 0.2 0.0 - 1.0 mg/dL   Nitrite NEGATIVE NEGATIVE   Leukocytes, UA NEGATIVE  NEGATIVE  Troponin I  Result Value Ref Range   Troponin I <0.03 <0.031 ng/mL  POC urine preg, ED (not at [facility])  Result Value Ref Range   Preg Test, Ur NEGATIVE NEGATIVE    Labs Review Labs Reviewed  COMPREHENSIVE METABOLIC PANEL - Abnormal; Notable for the following:    BUN <5 (*)    Alkaline Phosphatase 38 (*)    All other components within normal limits  CBC WITH DIFFERENTIAL/PLATELET  LIPASE, BLOOD  URINALYSIS, ROUTINE W REFLEX MICROSCOPIC  TROPONIN I  POC URINE PREG, ED    Imaging Review Mr Laqueta Jean Wo Contrast  03/31/2015   CLINICAL DATA:  Numbness, ear fullness, lightheadedness, dizziness, and blurred vision. Symptoms present for 3 days. Mild headache.  EXAM: MRI HEAD WITHOUT AND WITH CONTRAST  TECHNIQUE: Multiplanar, multiecho pulse sequences of the brain and surrounding structures were obtained without and with intravenous contrast.  CONTRAST:  20 mL MultiHance  COMPARISON:  None.  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 There is no evidence of acute infarct, intracranial hemorrhage midline shift, or extra-axial fluid collection. Ventricles and sulci are normal.  There are multiple small foci of T2 hyperintensity in the cerebral white matter bilaterally, greatest in the periventricular white matter and with involvement of the posterior body/ splenium of the corpus callosum on the left. Multiple small T2 hyperintense lesions are also present in the brainstem, predominantly in the dorsal pons, with involvement of the middle cerebellar peduncles as well. There are 2 small, ring-enhancing lesions in the pons, and there is a 3 mm solidly enhancing lesion in the right middle cerebellar peduncle. There is also minimal enhancement of a left corpus callosum/ periventricular lesion (series 11, image 31).  Orbits are unremarkable. Mastoid air  cells are clear. There is minimal bilateral ethmoid air cell mucosal thickening. Major intracranial vascular flow voids are preserved.  IMPRESSION: Multiple supratentorial and infratentorial white matter lesions, concerning for demyelinating disease (multiple sclerosis). Several lesions demonstrate enhancement, suggestive of active demyelination.   Electronically Signed   By: Sebastian Ache   On: 03/31/2015 13:14     EKG Interpretation   Date/Time:  Thursday March 31 2015  10:31:34 EDT Ventricular Rate:  86 PR Interval:  142 QRS Duration: 70 QT Interval:  364 QTC Calculation: 435 R Axis:   51 Text Interpretation:  Sinus rhythm Confirmed by OTTER  MD, OLGA ([id number]) on  03/31/2015 1:49:14 PM       Orthostatic VS for the past 24 hrs:  BP- Lying Pulse- Lying BP- Sitting Pulse- Sitting BP- Standing at 0 minutes Pulse- Standing at 0 minutes  03/31/15 1036 115/77 mmHg 88 126/79 mmHg 87 128/85 mmHg 95    1:32 PM This provider spoke with Dr. Roseanne Reno, Neurology. Discussed case, labs, imaging, ED course, physical exam in great detail. As per physician, reported that patient does not need prednisone at this time, recommended that patient can be discharged home with Neurology follow up.   2:03 PM This provider had a [place] discussion with patient and family at bedside - patient reported that it is okay for parents to know labs and results. Answered questions for patient. Discussed plan for discharge and for patient to follow-up with neurology. Reported that patient does not need to be admitted for this at this time.   MDM   Final diagnoses:  Multiple sclerosis    Medications  gadobenate dimeglumine (MULTIHANCE) injection 20 mL (not administered)  sodium chloride 0.9 % bolus 1,000 mL (1,000 mLs Intravenous New Bag/Given 03/31/15 1040)    Filed Vitals:   03/31/15 1045 03/31/15 1100 03/31/15 1122 03/31/15 1317  BP: 116/76 121/71 123/68 124/75  Pulse: 82 82 93 73  Temp:      TempSrc:      Resp: Weight:      SpO2: 100% 100% 100% 100%   This provider viewed the patient's chart. Patient was seen at urgent care Center on 03/11/2015 regarding vertigo-was recommended continue with meclizine that school gave patient. Patient presenting to the ED on 03/13/2015 regarding left ear fullness, pain and dizziness-patient was found to have a left otitis media was started on amoxicillin. EKG noted normal sinus rhythm with a heart rate of 86 bpm. Troponin negative elevation. CBC negative elevated leukocytosis. Hemoglobin 13.4, hematocrit 30.6. CMP unremarkable. Lipase negative elevation. Urine pregnancy negative. Urinalysis negative hemoglobin, nitrites, leukocytes-negative findings of infection. MRI of brain identified multiple supratentorial and infratentorial white matter lesions concerning for MS, suggestive of active demyelination.  Orthostatics unremarkable. Patient given IV fluids in ED setting. MRI of brain identified MS. Neurology, Dr. Roseanne Reno, consulted-discussed case in great detail-reported that patient can follow-up as an outpatient did not recommend prednisone at this time. Discussed imaging and results with patient and family at bedside in great detail. Patient stable, afebrile. Patient not septic appearing. Negative signs of respiratory distress. Discharged patient. [facility] Discussed with patient to rest and stay hydrated. Discussed with patient to closely monitor symptoms and if symptoms are to worsen or change to report back to the ED - strict return instructions given.  Patient agreed to plan of care, understood, all questions answered.   Raymon Mutton, PA-C 03/31/15 1410  Marisa Severin, MD 03/31/15 [phone]

## 2015-03-31 NOTE — ED Notes (Signed)
PT was recently tx for inner ear infection; still feels muffled and full. Also concerned for foot, hand, leg numbness. Started in her left foot and now progressing to leg and hand. All on left side. Denies slurred speech or other deficits. Foot numbness started 2 days ago at 6pm; hand started at 0800 yesterday. Leg numbness this morning. No neurological hx.

## 2015-04-01 ENCOUNTER — Telehealth: Payer: Self-pay | Admitting: Neurology

## 2015-04-01 NOTE — Telephone Encounter (Signed)
Dr. Sherie Don called back, the patient does have some balance issues, she has a positive Romberg, we will go ahead and start prednisone, 10 mg 12 day pack.

## 2015-04-01 NOTE — Telephone Encounter (Signed)
I got a call from Dr. Sherie Don. The patient has had a several day history of primarily left-sided sensory symptoms involving the leg, arm, and left face and ear. The patient had transient problems with some visual blurring when looking to the left. This visual issue has improved. MRI the brain was done yesterday showing evidence of what appears to be multiple sclerosis. The patient will be seeing Dr. Epimenio Foot in the next 2 days. I would not recommend starting steroids for a pure sensory event, but if the symptoms worsen, the patient is to contact our office, we may get steroids started. There is no weakness, gait disturbance, or visual loss.

## 2015-04-04 ENCOUNTER — Ambulatory Visit (INDEPENDENT_AMBULATORY_CARE_PROVIDER_SITE_OTHER): Payer: BLUE CROSS/BLUE SHIELD | Admitting: Neurology

## 2015-04-04 ENCOUNTER — Encounter: Payer: Self-pay | Admitting: Neurology

## 2015-04-04 ENCOUNTER — Telehealth: Payer: Self-pay | Admitting: Neurology

## 2015-04-04 VITALS — BP 138/86 | HR 76 | Resp 14 | Ht 62.0 in | Wt 194.2 lb

## 2015-04-04 DIAGNOSIS — R260 Ataxic gait: Secondary | ICD-10-CM | POA: Diagnosis not present

## 2015-04-04 DIAGNOSIS — E559 Vitamin D deficiency, unspecified: Secondary | ICD-10-CM

## 2015-04-04 DIAGNOSIS — G35 Multiple sclerosis: Secondary | ICD-10-CM | POA: Diagnosis not present

## 2015-04-04 DIAGNOSIS — R42 Dizziness and giddiness: Secondary | ICD-10-CM

## 2015-04-04 DIAGNOSIS — R5383 Other fatigue: Secondary | ICD-10-CM | POA: Diagnosis not present

## 2015-04-04 DIAGNOSIS — R200 Anesthesia of skin: Secondary | ICD-10-CM | POA: Diagnosis not present

## 2015-04-04 DIAGNOSIS — G4700 Insomnia, unspecified: Secondary | ICD-10-CM | POA: Diagnosis not present

## 2015-04-04 DIAGNOSIS — F411 Generalized anxiety disorder: Secondary | ICD-10-CM | POA: Diagnosis not present

## 2015-04-04 MED ORDER — LORAZEPAM 0.5 MG PO TABS: 0.5000 mg | ORAL_TABLET | Freq: Two times a day (BID) | ORAL | Status: DC | PRN

## 2015-04-04 NOTE — Telephone Encounter (Signed)
Closed in error. Please see below.  

## 2015-04-04 NOTE — Progress Notes (Signed)
[facility]  PATIENT: Tracey Lindsey DOB: 21-Mar-1991  REFERRING DOCTOR OR PCP:  Luan Pulling Corbin Ade) SOURCE: Patient and ER records and PACS images  _________________________________   HISTORICAL  CHIEF COMPLAINT:  Chief Complaint  Patient presents with  . Numbness    Sts. onset 6 weeks ago of dizziness, fatigue, then onset 1 week ago of numbness that started in left foot and progressed upward--now entire left side is numb.  Left ear feels "clogged."  She was seen at [facility] ED and after an mri brain, sts. was told she has MS and was told to f/u with neuro.  She is currently taking a Prednisone  taper pk.Chucky May  . Fatigue    HISTORY OF PRESENT ILLNESS:  I had the pleasure of seeing your patient, Tracey Lindsey, at [facility] for neurologic consultation regarding her vertigo, dizziness and numbness. About 6 weeks ago, she had the onset of vertigo. Initially that was felt to possibly be due to her allergies.  6 days ago, in the evening, she began to note some numbness in the left foot. When she woke up the next morning, she noted that the numbness was higher up the left arm and the next day it also involved the left hand and she went to the emergency room. In the emergency room, she had an MRI of the brain that showed white matter foci were some for multiple sclerosis. She was referred to our office for further evaluation and treatment.  Since Thursday, she has noted a little bit more numbness and has a small amount of numbness in the left face. Her gait is slightly off balance. She has not fallen. She was given a 12 day prednisone pack starting Friday. Compared to Thursday and Friday, she thinks there has been a little bit of progression but not a lot. While lying down, over the weekend she had just a little bit of diplopia. She is been no definite progression over the past 24 hours. I personally reviewed the MRI of the brain with and  without contrast. The MRI shows several posterior fossa lesions with one in the right middle cerebellar peduncle and several in the pons.   The middle cerebellar peduncle focus and 2 of the pontine foci enhanced. There are also several periventricular and deep white matter foci in the cerebral hemispheres.  About 2 years ago, she had an episode of vertigo and she felt her vision was off. She got appear her glasses changed at that time. After week, all the symptoms were resolved.  Gait/strength/sensation:   She notes that her gait is off balance. If she squats she feels more off balance. She has not fallen. She does not note any actual weakness in the legs. She continues to have some numbness with tingling on the left side of her body.  Bladder/bowel:  She denies any difficulties with her bladder. She has noted some constipation since starting prednisone.  Vision/ears:  Over the weekend, while laying down, she noted some double vision while watching TV. She does not note any diplopia now.   Her left ear feels clogged and sound is dulled on that side.     Fatigue/sleep:   The last 6 weeks, while at school, she felt drained every day. When she got home she just wanted to rest. This was unusual to her and did not occur earlier in the school year. She has not noted any definite association with heat. She has noted more difficulty  falling asleep. She is staying asleep better, but once she wakes up anytime after 3:30 or 4 she will continue to stay awake. Therefore, she has not slept as many hours as usual.  Mood/Cognition:   She notes some anxiety since her symptoms started a few weeks ago. He has been limited more anxiety the past week. However, there is no depression. She denies any significant cognitive change.  REVIEW OF SYSTEMS: Constitutional: No fevers, chills, sweats, or change in appetite Eyes: No visual changes, double vision, eye pain Ear, nose and throat: No hearing loss, ear pain, nasal  congestion, sore throat Cardiovascular: No chest pain, palpitations Respiratory: No shortness of breath at rest or with exertion.   No wheezes GastrointestinaI: No nausea, vomiting, diarrhea, abdominal pain, fecal incontinence Genitourinary: No dysuria, urinary retention or frequency.  No nocturia. Musculoskeletal: No neck pain, back pain Integumentary: No rash, pruritus, skin lesions Neurological: as above Psychiatric: No depression at this time.  No anxiety Endocrine: No palpitations, diaphoresis, change in appetite, change in weigh or increased thirst Hematologic/Lymphatic: No anemia, purpura, petechiae. Allergic/Immunologic: No itchy/runny eyes, nasal congestion, recent allergic reactions, rashes  ALLERGIES: No Known Allergies  HOME MEDICATIONS:  Current outpatient prescriptions:  .  budesonide (RHINOCORT AQUA) 32 MCG/ACT nasal spray, Place 2 sprays into both nostrils daily., Disp: , Rfl:  .  drospirenone-ethinyl estradiol (YASMIN,ZARAH,SYEDA) 3-0.03 MG tablet, Take 1 tablet by mouth daily., Disp: , Rfl:  .  fluticasone (CUTIVATE) 0.05 % cream, Apply 1 application topically daily as needed., Disp: , Rfl:  .  loratadine (CLARITIN) 10 MG tablet, Take 10 mg by mouth daily., Disp: , Rfl:  .  meclizine (ANTIVERT) 25 MG tablet, Take 25 mg by mouth 3 (three) times daily as needed for dizziness., Disp: , Rfl:  .  predniSONE (STERAPRED UNI-PAK 21 TAB) 5 MG (21) TBPK tablet, Take 5 mg by mouth daily., Disp: , Rfl:   PAST MEDICAL HISTORY: History reviewed. No pertinent past medical history.  PAST SURGICAL HISTORY: History reviewed. No pertinent past surgical history.  FAMILY HISTORY: History reviewed. No pertinent family history.  SOCIAL HISTORY:  History   Social History  . Marital Status: Single    Spouse Name: N/A  . Number of Children: N/A  . Years of Education: N/A   Occupational History  . Not on file.   Social History Main Topics  . Smoking status: Never Smoker     . Smokeless tobacco: Not on file  . Alcohol Use: No  . Drug Use: No  . Sexual Activity: Yes    Birth Control/ Protection: Pill   Other Topics Concern  . Not on file   Social History Narrative     PHYSICAL EXAM  Filed Vitals:   04/04/15 0842  BP: 138/86  Pulse: 76  Resp: 14  Height: 5\' 2"  (1.575 m)  Weight: 194 lb 3.2 oz (88.089 kg)    Body mass index is 35.51 kg/(m^2).   General: The patient is well-developed and well-nourished and in no acute distress  Eyes:  Funduscopic exam shows normal optic discs and retinal vessels.  Ears:  Tympanic membranes were intact.  Neck: The neck is supple, no carotid bruits are noted.  The neck is nontender.  Cardiovascular: The heart has a regular rate and rhythm with a normal S1 and S2. There were no murmurs, gallops or rubs. Lungs are clear to auscultation.  Skin: Extremities are without significant edema.  Musculoskeletal:  Back is nontender  Neurologic Exam  Mental status: The patient  is alert and oriented x 3 at the time of the examination. The patient has apparent normal recent and remote memory, with an apparently normal attention span and concentration ability.   Speech is normal.  Cranial nerves: Extraocular movements are full. Pupils are equal, round, and reactive to light and accomodation.  Visual fields are full.  Facial symmetry is present. There is good facial sensation to soft touch bilaterally.Facial strength is normal.  Trapezius and sternocleidomastoid strength is normal. No dysarthria is noted.  The tongue is midline, and the patient has symmetric elevation of the soft palate. No obvious hearing deficits are noted.  Motor:  Muscle bulk is normal.   Tone is normal. Strength is  5 / 5 in all 4 extremities.   Sensory: Sensory testing shows mildly reduced vibratory sensation in the left arm and leg. Temperature and touch sensation were felt bilaterally but there was mild altered sensation on the left..  Coordination:  Cerebellar testing reveals good finger-nose-finger bilaterally and mildly reduced heel-to-shin on the left.  Gait and station: Station is normal.   Gait is normal. Tandem gait is wide. Romberg is positive.   Reflexes: Deep tendon reflexes are symmetric and increased bilaterally with spread at the knees and 2 beats non-sustained clonus at ankles.   Plantar responses are flexor.    DIAGNOSTIC DATA (LABS, IMAGING, TESTING) - I reviewed patient records, labs, notes, testing and imaging myself where available.  Lab Results  Component Value Date   WBC 6.1 03/31/2015   HGB 13.4 03/31/2015   HCT 38.6 03/31/2015   MCV 90.6 03/31/2015   PLT 265 03/31/2015      Component Value Date/Time   NA 140 03/31/2015 1015   K 3.5 03/31/2015 1015   CL 109 03/31/2015 1015   CO2 23 03/31/2015 1015   GLUCOSE 94 03/31/2015 1015   BUN <5* 03/31/2015 1015   CREATININE 0.70 03/31/2015 1015   CALCIUM 9.3 03/31/2015 1015   PROT 6.6 03/31/2015 1015   ALBUMIN 3.7 03/31/2015 1015   AST 20 03/31/2015 1015   ALT 16 03/31/2015 1015   ALKPHOS 38* 03/31/2015 1015   BILITOT 0.5 03/31/2015 1015   GFRNONAA >90 03/31/2015 1015   GFRAA >90 03/31/2015 1015      ASSESSMENT AND PLAN  Multiple sclerosis - Plan: Vit D  25 hydroxy (rtn osteoporosis monitoring), Stratify JCV Antibody Test ([facility]), Varicella Zoster Antibody, IgG, MR Cervical Spine Wo Contrast, MR Cervical Spine W Wo Contrast  Vertigo  Numbness - Plan: MR Cervical Spine Wo Contrast, MR Cervical Spine W Wo Contrast  Vitamin D deficiency - Plan: Vit D  25 hydroxy (rtn osteoporosis monitoring)  Ataxic gait - Plan: MR Cervical Spine Wo Contrast, MR Cervical Spine W Wo Contrast  Anxiety state  Insomnia  Other fatigue   In summary, Tracey Lindsey is a 24 year old woman with episodes of vertigo and off-balance gait and numbness. Her MRI is very consistent with multiple sclerosis. I discussed with her that either the combination of her symptoms and an  MRI showing a mixture of new and old foci with some in the brainstem and others in the periventricular white matter make MS by far the most likely diagnosis.   We spent a while discussing her therapeutic options. I am concerned that her activations have involved her brainstem which implies a higher aggressiveness of her MS. Therefore, my recommendation would be that she begin on either Tysabri she is JCV negative and Gilenya if she is JCV positive. The JCV antibody  test can be used to help stratify risk of PML while on Tysabri. She is wanting to make sure s   he goes on something with good efficacy is also concerned about safety. She is agreeable to this plan and would want to start Tysabri if she is JCV negative.    To help stage her MS and because of her numbness and gait issues I would like to check an MRI of the cervical spine as well. We will also check blood work for varicella-zoster antibody as I would want to immunize her if she is negative for this and she chooses Gilenya will also check for her vitamin D level as low vitamin D is associated with worse alcohol, and MS.  I will have her take at least 3 days of IV Solu-Medrol at 1 g a day with the first dose today. If there is no response by the third day, I would add 2 additional days.   She has had some insomnia and anxiety I am concerned that the high dose steroid will make this worse this week and I wrote her for an additional 15 tablets of Ativan to take as needed.  She will return for her next IV and is to call us if she has any new or worsening neurologic symptoms.  Tracey Lindsey A. Epimenio Foot, MD, PhD 04/04/2015, 8:50 AM Certified in Neurology, Clinical Neurophysiology, Sleep Medicine, Pain Medicine and Neuroimaging  [facility] [address] [place], [place] [address] [phone]

## 2015-04-04 NOTE — Telephone Encounter (Signed)
LMTC./fim 

## 2015-04-04 NOTE — Telephone Encounter (Signed)
I spoke with Kim--I have Gilenya and Tysabri booklets for her and will give them to her tomorrow when she comes in for SoluMedrol day 2 infusioin/fim

## 2015-04-04 NOTE — Telephone Encounter (Signed)
I have spoken with Tracey Lindsey.  I  have information on Gilenya and Tysabri for her--will give it to her at her appt. tomorrow for day 2 SoluMedrol/fim

## 2015-04-04 NOTE — Telephone Encounter (Signed)
Patient called stating that she was returning Faith's call from today 04/04/15. Patient can be reached @ [phone]

## 2015-04-04 NOTE — Telephone Encounter (Signed)
Pt wanted to receive information on medications that Dr. Epimenio Foot mentioned her possibly needing in the future.

## 2015-04-05 ENCOUNTER — Telehealth: Payer: Self-pay | Admitting: *Deleted

## 2015-04-05 LAB — VARICELLA ZOSTER ANTIBODY, IGG: Varicella zoster IgG: 1954 index (ref >165)

## 2015-04-05 LAB — VITAMIN D 25 HYDROXY (VIT D DEFICIENCY, FRACTURES): Vit D, 25-Hydroxy: 12.1 ng/mL — ABNORMAL LOW (ref 30.0–100.0)

## 2015-04-05 MED ORDER — VITAMIN D (ERGOCALCIFEROL) 1.25 MG (50000 UNIT) PO CAPS: ORAL_CAPSULE | ORAL | Status: DC

## 2015-04-05 NOTE — Telephone Encounter (Signed)
-----   Message from Asa Lente, MD sent at 04/05/2015 12:32 PM EDT ----- Vit D is very low    50000 U weekly x 12w then 4000-5000 U daily otc.   (can tell her when she gets IV Solumedrol tomorrow)

## 2015-04-05 NOTE — Telephone Encounter (Signed)
I have spoken with Tracey Lindsey and per RAS, advised that vit. d level was low, advised of the need for rx. vit. d 50,000iu weekly for 12 weeks, then otc vit. d 4-5,000iu daily.  She verbalized understanding of same, and rx. escribed to [facility] [address] per her request/fim

## 2015-04-07 ENCOUNTER — Telehealth: Payer: Self-pay | Admitting: Neurology

## 2015-04-07 DIAGNOSIS — R350 Frequency of micturition: Secondary | ICD-10-CM

## 2015-04-07 DIAGNOSIS — Z79899 Other long term (current) drug therapy: Secondary | ICD-10-CM

## 2015-04-07 DIAGNOSIS — G35 Multiple sclerosis: Secondary | ICD-10-CM

## 2015-04-07 NOTE — Telephone Encounter (Signed)
Patient called and stated that she has been experiencing some new symptoms such as, frequent urination and burning in throat. Would like to speak with someone regarding these issues. Please call and advise.

## 2015-04-07 NOTE — Telephone Encounter (Signed)
I have spoken with  Tracey Lindsey.  She had some urinary frequency last night--but sts. it really doesn't feel like she's starting with a uti.  No dysuria and urine is clear.  No vag. d/c.  No fever or other signs of infection.  She will monitor sx. closely and call me with an update tomorrow.  If she feels sx. are worsening, she will come in for u/a.  She is aware our office closes at noon on Friday.  Per RAS, can offer Bactrim prn./fim

## 2015-04-08 ENCOUNTER — Other Ambulatory Visit (INDEPENDENT_AMBULATORY_CARE_PROVIDER_SITE_OTHER): Payer: Self-pay

## 2015-04-08 DIAGNOSIS — G35 Multiple sclerosis: Secondary | ICD-10-CM

## 2015-04-08 DIAGNOSIS — R350 Frequency of micturition: Secondary | ICD-10-CM

## 2015-04-08 DIAGNOSIS — Z79899 Other long term (current) drug therapy: Secondary | ICD-10-CM

## 2015-04-08 DIAGNOSIS — Z0289 Encounter for other administrative examinations: Secondary | ICD-10-CM

## 2015-04-08 MED ORDER — SULFAMETHOXAZOLE-TRIMETHOPRIM 800-160 MG PO TABS: 1.0000 | ORAL_TABLET | Freq: Two times a day (BID) | ORAL | Status: DC

## 2015-04-08 NOTE — Telephone Encounter (Signed)
Patient called back and stated that she would like to come in for a u/a. Please call and advise.

## 2015-04-08 NOTE — Telephone Encounter (Signed)
I have spoken with Selena Batten this am.  She continues to have urinary frequency.  She will come in this morning for u/a, urine c&s.  Given that it is Friday, will go ahead and send [facility] [place], per RAS ok/fim

## 2015-04-09 LAB — URINE CULTURE

## 2015-04-09 LAB — URINALYSIS, ROUTINE W REFLEX MICROSCOPIC
Bilirubin, UA: NEGATIVE
GLUCOSE, UA: NEGATIVE
KETONES UA: NEGATIVE
LEUKOCYTES UA: NEGATIVE
Nitrite, UA: NEGATIVE
Protein, UA: NEGATIVE
RBC, UA: NEGATIVE
Urobilinogen, Ur: 0.2 mg/dL (ref 0.2–1.0)
pH, UA: 6.5 (ref 5.0–7.5)

## 2015-04-11 ENCOUNTER — Telehealth: Payer: Self-pay | Admitting: *Deleted

## 2015-04-11 NOTE — Telephone Encounter (Signed)
-----   Message from Asa Lente, MD sent at 04/11/2015  2:07 PM EDT ----- U/A iand culture are normal

## 2015-04-11 NOTE — Telephone Encounter (Signed)
I have spoken with Tracey Lindsey today and per RAS, advised that u/a, c&s are normal, and that if she started Septra, she may stop.  She verbalized understanding of same/fim

## 2015-04-13 ENCOUNTER — Ambulatory Visit (INDEPENDENT_AMBULATORY_CARE_PROVIDER_SITE_OTHER): Payer: BLUE CROSS/BLUE SHIELD

## 2015-04-13 DIAGNOSIS — R200 Anesthesia of skin: Secondary | ICD-10-CM | POA: Diagnosis not present

## 2015-04-13 DIAGNOSIS — R260 Ataxic gait: Secondary | ICD-10-CM | POA: Diagnosis not present

## 2015-04-13 DIAGNOSIS — G35 Multiple sclerosis: Secondary | ICD-10-CM | POA: Diagnosis not present

## 2015-04-13 MED ORDER — GADOPENTETATE DIMEGLUMINE 469.01 MG/ML IV SOLN: 19.0000 mL | Freq: Once | INTRAVENOUS | Status: AC | PRN

## 2015-04-25 ENCOUNTER — Telehealth: Payer: Self-pay | Admitting: Neurology

## 2015-04-25 NOTE — Telephone Encounter (Signed)
-----   Message from Richard A Sater, MD sent at 04/25/2015  2:44 PM EDT ----- Please let her know she did have one spot on the cervical spine MRI..   Since JCV negative, lets do Tysabri 

## 2015-04-25 NOTE — Telephone Encounter (Signed)
LMTC./fim 

## 2015-04-25 NOTE — Telephone Encounter (Signed)
I have spoken with Tracey Lindsey, and per RAS, advised that mri c-spine showed one ms lesion.  I also advsied that jcv ab is negative.  She would like to proceed with Tysabri, but has not signed a start form . She  would like to discuss more with RAS prior to signing start form.  Appt. given 04-27-15/fim

## 2015-04-25 NOTE — Telephone Encounter (Signed)
Returning your call and would like you to call her back.

## 2015-04-25 NOTE — Telephone Encounter (Signed)
Pt called requesting results from MRI and labwork.  Please call and advise. Pt can be reached @ [phone].

## 2015-04-25 NOTE — Telephone Encounter (Signed)
-----   Message from Asa Lente, MD sent at 04/25/2015  2:44 PM EDT ----- Please let her know she did have one spot on the cervical spine MRI.Marland Kitchen   Since JCV negative, lets do Tysabri

## 2015-04-27 ENCOUNTER — Encounter: Payer: Self-pay | Admitting: Neurology

## 2015-04-27 ENCOUNTER — Ambulatory Visit (INDEPENDENT_AMBULATORY_CARE_PROVIDER_SITE_OTHER): Payer: BLUE CROSS/BLUE SHIELD | Admitting: Neurology

## 2015-04-27 VITALS — BP 115/84 | HR 101 | Ht 62.0 in | Wt 195.0 lb

## 2015-04-27 DIAGNOSIS — G35 Multiple sclerosis: Secondary | ICD-10-CM | POA: Diagnosis not present

## 2015-04-27 DIAGNOSIS — R200 Anesthesia of skin: Secondary | ICD-10-CM

## 2015-04-27 DIAGNOSIS — E559 Vitamin D deficiency, unspecified: Secondary | ICD-10-CM | POA: Diagnosis not present

## 2015-04-27 DIAGNOSIS — R5383 Other fatigue: Secondary | ICD-10-CM

## 2015-04-27 NOTE — Progress Notes (Signed)
[facility]  PATIENT: Tracey Lindsey DOB: 1991-06-24  REFERRING DOCTOR OR PCP:  Luan Pulling Corbin Ade) SOURCE: Patient and ER records and PACS images  _________________________________   HISTORICAL  CHIEF COMPLAINT:  Chief Complaint  Patient presents with  . Follow-up    medication selection, 6-7 watery stools episodes since last night no p,ain associated with. muscle aches     HISTORY OF PRESENT ILLNESS:  Tracey Lindsey is a 24 year old woman recently diagnosed with MS.   Since getting 3 days of IV steroid, her numbness, bladder and diplopia has also improved.     Gait/strength/sensation:   Gait is better now than 2 weeks ago.   She has not fallen. She denies actual weakness in the legs. She notes almost complete resolution of the numbnesson the left side of her body.  Bladder/bowel:  She denies any difficulties with her bladder.   Vision/ears:  Diplopia has resolved.       Fatigue/sleep:   She continues to note fatigue daily, though is a little better than 2 months ago.    She is sleeping well.  Mood/Cognition:   She notes some anxiety but no depression since her symptoms started a few weeks ago. He has been limited more anxiety the past week.  She denies any significant cognitive change.  MS History:   In retrospect, she had an episode of vertigo in 2014 with mild vision changes that improved.    In March 2016, she had vertigo that improved after a couple weeks.  In late April, she had  numbness in the left foot. When she woke up the next morning, she noted that the numbness was higher up the left arm and the next day it also involved the left hand and she went to the emergency room. In the emergency room, she had an MRI of the brain that showed white matter foci were some for multiple sclerosis.   MRI of the brain with and without contrast 03/29/15 showed several posterior fossa lesions with one in the right middle cerebellar peduncle and several in  the pons.   The middle cerebellar peduncle focus and 2 of the pontine foci enhanced. There are also several periventricular and deep white matter foci in the cerebral hemispheres.   MRI cervical spine shows one enhancing focus.       REVIEW OF SYSTEMS: Constitutional: No fevers, chills, sweats, or change in appetite Eyes: No visual changes, double vision, eye pain Ear, nose and throat: No hearing loss, ear pain, nasal congestion, sore throat Cardiovascular: No chest pain, palpitations Respiratory: No shortness of breath at rest or with exertion.   No wheezes GastrointestinaI: No nausea, vomiting, diarrhea, abdominal pain, fecal incontinence Genitourinary: No dysuria, urinary retention or frequency.  No nocturia. Musculoskeletal: No neck pain, back pain Integumentary: No rash, pruritus, skin lesions Neurological: as above Psychiatric: No depression at this time.  No anxiety Endocrine: No palpitations, diaphoresis, change in appetite, change in weigh or increased thirst Hematologic/Lymphatic: No anemia, purpura, petechiae. Allergic/Immunologic: No itchy/runny eyes, nasal congestion, recent allergic reactions, rashes  ALLERGIES: No Known Allergies  HOME MEDICATIONS:  Current outpatient prescriptions:  .  budesonide (RHINOCORT AQUA) 32 MCG/ACT nasal spray, Place 2 sprays into both nostrils as needed. , Disp: , Rfl:  .  drospirenone-ethinyl estradiol (YASMIN,ZARAH,SYEDA) 3-0.03 MG tablet, Take 1 tablet by mouth daily., Disp: , Rfl:  .  loratadine (CLARITIN) 10 MG tablet, Take 10 mg by mouth daily as needed. , Disp: , Rfl:  .  LORazepam (ATIVAN) 0.5 MG tablet, Take 1 tablet (0.5 mg total) by mouth 2 (two) times daily as needed for anxiety., Disp: 15 tablet, Rfl: 0 .  meclizine (ANTIVERT) 25 MG tablet, Take 25 mg by mouth 3 (three) times daily as needed for dizziness., Disp: , Rfl:  .  Vitamin D, Ergocalciferol, (DRISDOL) 50000 UNITS CAPS capsule, Take 1 capsule weekly until gone., Disp:  12 capsule, Rfl: 0 .  fluticasone (CUTIVATE) 0.05 % cream, Apply 1 application topically daily as needed., Disp: , Rfl:  .  predniSONE (STERAPRED UNI-PAK 21 TAB) 5 MG (21) TBPK tablet, Take 5 mg by mouth daily., Disp: , Rfl:  .  sulfamethoxazole-trimethoprim (BACTRIM DS,SEPTRA DS) 800-160 MG per tablet, Take 1 tablet by mouth 2 (two) times daily. (Patient not taking: Reported on 04/27/2015), Disp: 14 tablet, Rfl: 0  PAST MEDICAL HISTORY: History reviewed. No pertinent past medical history.  PAST SURGICAL HISTORY: History reviewed. No pertinent past surgical history.  FAMILY HISTORY: History reviewed. No pertinent family history.  SOCIAL HISTORY:  History   Social History  . Marital Status: Single    Spouse Name: N/A  . Number of Children: N/A  . Years of Education: N/A   Occupational History  . unemployeed     Social History Main Topics  . Smoking status: Never Smoker   . Smokeless tobacco: Never Used  . Alcohol Use: No  . Drug Use: No  . Sexual Activity: Yes    Birth Control/ Protection: Pill   Other Topics Concern  . Not on file   Social History Narrative   Lives with mother    Drinks soda daily (8-10 ounces)     PHYSICAL EXAM  Filed Vitals:   04/27/15 1055  BP: 115/84  Pulse: 101  Height:  (1.575 m)  Weight: 195 lb (88.451 kg)    Body mass index is 35.66 kg/(m^2).   General: The patient is well-developed and well-nourished and in no acute distress  Eyes:  Funduscopic exam shows normal optic discs and retinal vessels.  Ears:  Tympanic membranes were intact.  Neck: The neck is supple, no carotid bruits are noted.  The neck is nontender.  Cardiovascular: The heart has a regular rate and rhythm with a normal S1 and S2. There were no murmurs, gallops or rubs. Lungs are clear to auscultation.  Skin: Extremities are without significant edema.  Musculoskeletal:  Back is nontender  Neurologic Exam  Mental status: The patient is alert and oriented  x 3 at the time of the examination. The patient has apparent normal recent and remote memory, with an apparently normal attention span and concentration ability.   Speech is normal.  Cranial nerves: Extraocular movements are full. Pupils are equal, round, and reactive to light and accomodation.  Visual fields are full.  Facial symmetry is present. There is good facial sensation to soft touch bilaterally.Facial strength is normal.  Trapezius and sternocleidomastoid strength is normal. No dysarthria is noted.  The tongue is midline, and the patient has symmetric elevation of the soft palate. No obvious hearing deficits are noted.  Motor:  Muscle bulk is normal.   Tone is normal. Strength is  5 / 5 in all 4 extremities.   Sensory: Sensory testing shows mildly reduced vibratory sensation in the left arm and leg. Temperature and touch sensation were felt bilaterally but there was mild altered sensation on the left..  Coordination: Cerebellar testing reveals good finger-nose-finger bilaterally and mildly reduced heel-to-shin on the left.  Gait and  station: Station is normal.   Gait is normal. Tandem gait is wide. Romberg is positive.   Reflexes: Deep tendon reflexes are symmetric and increased bilaterally with spread at the knees and 2 beats non-sustained clonus at ankles.   Plantar responses are flexor.    DIAGNOSTIC DATA (LABS, IMAGING, TESTING) - I reviewed patient records, labs, notes, testing and imaging myself where available.  Lab Results  Component Value Date   WBC 6.1 03/31/2015   HGB 13.4 03/31/2015   HCT 38.6 03/31/2015   MCV 90.6 03/31/2015   PLT 265 03/31/2015      Component Value Date/Time   NA 140 03/31/2015 1015   K 3.5 03/31/2015 1015   CL 109 03/31/2015 1015   CO2 23 03/31/2015 1015   GLUCOSE 94 03/31/2015 1015   BUN <5* 03/31/2015 1015   CREATININE 0.70 03/31/2015 1015   CALCIUM 9.3 03/31/2015 1015   PROT 6.6 03/31/2015 1015   ALBUMIN 3.7 03/31/2015 1015   AST 20  03/31/2015 1015   ALT 16 03/31/2015 1015   ALKPHOS 38* 03/31/2015 1015   BILITOT 0.5 03/31/2015 1015   GFRNONAA >90 03/31/2015 1015   GFRAA >90 03/31/2015 1015   Mr Brain W Wo Contrast  03/31/2015   CLINICAL DATA:  Numbness, ear fullness, lightheadedness, dizziness, and blurred vision. Symptoms present for 3 days. Mild headache.  EXAM: MRI HEAD WITHOUT AND WITH CONTRAST  TECHNIQUE: Multiplanar, multiecho pulse sequences of the brain and surrounding structures were obtained without and with intravenous contrast.  CONTRAST:  20 mL MultiHance  COMPARISON:  None.  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 There is no evidence of acute infarct, intracranial hemorrhage midline shift, or extra-axial fluid collection. Ventricles and sulci are normal.  There are multiple small foci of T2 hyperintensity in the cerebral white matter bilaterally, greatest in the periventricular white matter and with involvement of the posterior body/ splenium of the corpus callosum on the left. Multiple small T2 hyperintense lesions are also present in the brainstem, predominantly in the dorsal pons, with involvement of the middle cerebellar peduncles as well. There are 2 small, ring-enhancing lesions in the pons, and there is a 3 mm solidly enhancing lesion in the right middle cerebellar peduncle. There is also minimal enhancement of a left corpus callosum/ periventricular lesion (series 11, image 31).  Orbits are unremarkable. Mastoid air cells are clear. There is minimal bilateral ethmoid air cell mucosal thickening. Major intracranial vascular flow voids are preserved.  IMPRESSION: Multiple supratentorial and infratentorial white matter lesions, concerning for demyelinating disease (multiple sclerosis). Several lesions demonstrate enhancement, suggestive of active demyelination.    Electronically Signed   By: Sebastian Ache   On: 03/31/2015 13:14   Mr Cervical Spine W Wo Contrast  04/14/2015   [facility]  NEUROIMAGING REPORT   STUDY DATE: 04/13/15 PATIENT NAME: ALBIRTA HEMMINGSEN DOB: [birth year] MRN: [id number]  ORDERING CLINICIAN: Despina Arias, MD PhD  CLINICAL HISTORY: 24 year old female with multiple sclerosis.  EXAM: MRI cervical spine (with and without)  TECHNIQUE: MRI of the cervical spine was obtained utilizing 3 mm sagittal  slices from the posterior fossa down to the T3-4 level with T1, T2 and  inversion recovery views. In addition 4 mm axial slices from C2-3 down to  T1-2 level were included with T2 and gradient echo views. CONTRAST: 23ml magnevist IMAGING SITE: [facility] (1.5 Tesla MRI)    FINDINGS:  On sagittal views the vertebral bodies have normal height and alignment.   The spinal cord is notable for 1 subtle STIR hyperintense lesion (2mm) on  sagittal views (series 5 image 8) which has subtle enhancement (series 10  image 9; series 9 image 8). The posterior fossa, pituitary gland and  paraspinal soft tissues are notable for T2 hyperintense lesions within the  pons and middle cerebellar peduncles.   On axial views there is no spinal stenosis or foraminal narrowing. Limited  views of the soft tissues of the head and neck are unremarkable.    04/14/2015   Abnormal MRI cervical spine (with and without) demonstrating: 1. Spinal cord is notable for 1 subtle enhancing, STIR hyperintense lesion  (2mm) at C4 level on the left side. May represent an acute demyelinating  plaque. 2. Chronic demyelinating plaques noted within the pons and middle  cerebellar peduncles. These are better visualized on MRI brain from  03/31/15.  3. Remainder of spinal cord is unremarkable.     INTERPRETING PHYSICIAN:  Suanne Marker, MD Certified in Neurology, Neurophysiology and Neuroimaging  [facility] [address] [place], [place] [address] [phone]     ASSESSMENT AND PLAN  Multiple sclerosis  Other fatigue  Numbness  Vitamin D deficiency   1.  We had a Vidana discussion about options to treat her MS. I am very concerned that she presented with several lesions in the brainstem/cerebellar peduncles and 1 in the spinal cord and at least a couple of them were enhancing. This is worrisome that she might have a more aggressive course of MS. Therefore, I think we need to consider a more efficacious medication. At our last visit, I discussed Gilenya and Tysabri in more detail. We went over the benefits and risks of both including the risk of symptomatic bradycardia and infection with Gilenya and the risk of PML with Tysabri. Her JCV antibody test is negative and therefore I think that Tysabri will offer her the best balance of efficacy, safety and tolerability. In the [facility] and we will turn this in this week. 2.   She will continue the vitamin D for her deficiency. 3.   If fatigue did not improve when her MS is treated with Tysabri, I would consider adding Provigil/Nuvigil or other agents.  She will return for her first infusion and is to call us if she has any new or worsening neurologic symptoms.  Antavious Spanos A. Epimenio Foot, MD, PhD 04/27/2015, 10:57 AM Certified in Neurology, Clinical Neurophysiology, Sleep Medicine, Pain Medicine and Neuroimaging  [facility] [address] [place], [place] [address] [phone]

## 2015-05-10 ENCOUNTER — Telehealth: Payer: Self-pay | Admitting: Neurology

## 2015-05-10 MED ORDER — GABAPENTIN 300 MG PO CAPS: ORAL_CAPSULE | ORAL | Status: DC

## 2015-05-10 NOTE — Telephone Encounter (Signed)
Patient called regarding new symptoms she having and is inquiring if symptoms could be part of MS. She started a new job yesterday but had trouble sleeping last night, when she would dose off heart seemed to increase and muscles are aching today. Please call and advise. Patient can be reached at [phone].

## 2015-05-10 NOTE — Telephone Encounter (Signed)
Patient called again requesting to speak with Faith RN. Please call and advise.

## 2015-05-10 NOTE — Telephone Encounter (Signed)
I have spoken with Tracey Lindsey this afternoon.  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  It is not clear if sx. are related to new job, active job, or ms related.  Per RAS, ok for Gabapentin 600mg  po qhs prn sleep. and Tracey Lindsey is to call if sx. change or worsen.  Tracey Lindsey is agreeable with this plan.  I have escribed [facility] to [facility] in [place] per her request.  She has an upcoming appt. for Tysabri Infusion--I will check on her then if I have not heard from her before then/fim

## 2015-05-12 NOTE — Telephone Encounter (Signed)
I have spoken with Tracey Lindsey this afternoon--she sts. most sx. are improved with Gabapentin, but she is still having numbness right side, from upper chest all the way down right leg.  She can't tell difference in water temp on right side.  Per RAS, I have advised this may represent an exacerbation, and have offered 2 days of IV Solumedrol.  She is agreeable, but sts. can't come in today--she will come in tomorrow am at 0800 for day 1, will try to repeat on Monday 6-13 if job allows and sx. are still present.  She is sched. for Tysabri #1 on 05-18-15, here at [facility]./fim

## 2015-05-12 NOTE — Telephone Encounter (Signed)
Patient is calling to give an update on her condition. Patient is sleeping better but numbness has increased on patient's right side. Will the Tysabri infusion which she will soon be starting help with these symptoms? Please call and advise.

## 2015-05-13 ENCOUNTER — Encounter: Payer: Self-pay | Admitting: Neurology

## 2015-05-13 ENCOUNTER — Encounter: Payer: Self-pay | Admitting: *Deleted

## 2015-05-18 ENCOUNTER — Encounter: Payer: Self-pay | Admitting: *Deleted

## 2015-05-19 ENCOUNTER — Encounter: Payer: Self-pay | Admitting: *Deleted

## 2015-05-23 ENCOUNTER — Telehealth: Payer: Self-pay | Admitting: Neurology

## 2015-05-23 NOTE — Telephone Encounter (Signed)
I have spoken with Tracey Lindsey this morning. She reports continued c/o numbness/tingling right arm, sts. she also feels left arm is some weaker than right arm, and also still c/o burning sensation right legs.  Given mult.  complaints, I have given appt. with RAS on Thursday--05-26-15 at 0840.  I have spoken with RAS and he is agreeable with this/fim

## 2015-05-23 NOTE — Telephone Encounter (Signed)
Patient called stating she was having left arm weakness which is worse than the rt arm which started yesterday(6/19) and is still experiencing weakness in left arm. Please call and advise. Patient can be reached at [phone].

## 2015-05-26 ENCOUNTER — Ambulatory Visit (INDEPENDENT_AMBULATORY_CARE_PROVIDER_SITE_OTHER): Payer: BLUE CROSS/BLUE SHIELD | Admitting: Neurology

## 2015-05-26 ENCOUNTER — Encounter: Payer: Self-pay | Admitting: Neurology

## 2015-05-26 VITALS — BP 112/82 | HR 72 | Resp 16 | Ht 62.0 in | Wt 193.4 lb

## 2015-05-26 DIAGNOSIS — R200 Anesthesia of skin: Secondary | ICD-10-CM | POA: Diagnosis not present

## 2015-05-26 DIAGNOSIS — F418 Other specified anxiety disorders: Secondary | ICD-10-CM

## 2015-05-26 DIAGNOSIS — R260 Ataxic gait: Secondary | ICD-10-CM | POA: Diagnosis not present

## 2015-05-26 DIAGNOSIS — R5383 Other fatigue: Secondary | ICD-10-CM | POA: Diagnosis not present

## 2015-05-26 DIAGNOSIS — G35 Multiple sclerosis: Secondary | ICD-10-CM

## 2015-05-26 DIAGNOSIS — R29898 Other symptoms and signs involving the musculoskeletal system: Secondary | ICD-10-CM

## 2015-05-26 NOTE — Progress Notes (Signed)
[facility]  PATIENT: Tracey Lindsey DOB: 09/13/1991  REFERRING DOCTOR OR PCP:  Luan Pulling Corbin Ade) SOURCE: Patient and ER records and PACS images  _________________________________   HISTORICAL  CHIEF COMPLAINT:  Chief Complaint  Patient presents with  . Multiple Sclerosis    Tracey Lindsey had her first Tysabri infusion on 05-18-15 and tolerated it well.  JCV ab last checked on 04-04-15 and was negative at 0.14.  She has continued c/o burning sensation right leg, urinary frequency (no incontinence), and now has new c/o left arm feeling heavier than the right--sts. it is more difficult to put her hair in a ponytail now.  U/A and C&S were just checked in May and were negative for infection./fim    HISTORY OF PRESENT ILLNESS:  Tracey Lindsey is a 24 year old woman recently diagnosed with MS.    Gait/strength/sensation:   Her left arm stated to feel weaker on Sunday.   Raising her arm over her head is harder and the hand feels stiffer.     She feels the left leg is stiffer.    Gait is slower -- harder to keep up with others.    She has not fallen. She denies much weakness in the legs. She notes almost complete resolution of the numbnesson the left side of her body --- only in the left hand  Bladder/bowel:  She denies any difficulties with her bladder.   She is going more frequently  Vision/ears:  Diplopia has resolved.       Fatigue/sleep:   She feels tired but is able to work 40 hours and is doing better than a couple months ago.   She feels like she wants to go to bed as soon as she gets home from work.   She is sleeping well.  Mood/Cognition:   She notes some anxiety and moodiness and she feels a little more sad.   She has some anxiety, not any better than earlier  She denies any significant cognitive change.  MS History:   In retrospect, she had an episode of vertigo in 2014 with mild vision changes that improved.    In March 2016, she had vertigo that improved  after a couple weeks.  In late April, she had  numbness in the left foot. When she woke up the next morning, she noted that the numbness was higher up the left arm and the next day it also involved the left hand and she went to the emergency room. In the emergency room, she had an MRI of the brain that showed white matter foci were some for multiple sclerosis.   MRI of the brain with and without contrast 03/29/15 showed several posterior fossa lesions with one in the right middle cerebellar peduncle and several in the pons.   The middle cerebellar peduncle focus and 2 of the pontine foci enhanced. There are also several periventricular and deep white matter foci in the cerebral hemispheres.   MRI cervical spine shows one enhancing focus.       REVIEW OF SYSTEMS: Constitutional: No fevers, chills, sweats, or change in appetite Eyes: No visual changes, double vision, eye pain Ear, nose and throat: No hearing loss, ear pain, nasal congestion, sore throat Cardiovascular: No chest pain, palpitations Respiratory: No shortness of breath at rest or with exertion.   No wheezes GastrointestinaI: No nausea, vomiting, diarrhea, abdominal pain, fecal incontinence Genitourinary: Notes frequency, nocturia (x2) Musculoskeletal: No neck pain, back pain Integumentary: No rash, pruritus, skin lesions  Neurological: as above Psychiatric: as above  Endocrine: No palpitations, diaphoresis, change in appetite, change in weigh or increased thirst Hematologic/Lymphatic: No anemia, purpura, petechiae. Allergic/Immunologic: No itchy/runny eyes, nasal congestion, recent allergic reactions, rashes  ALLERGIES: No Known Allergies  HOME MEDICATIONS:  Current outpatient prescriptions:  .  drospirenone-ethinyl estradiol (YASMIN,ZARAH,SYEDA) 3-0.03 MG tablet, Take 1 tablet by mouth daily., Disp: , Rfl:  .  gabapentin (NEURONTIN) 300 MG capsule, Take 1-2 capsules at bedtime as needed., Disp: 60 capsule, Rfl: 3 .   loratadine (CLARITIN) 10 MG tablet, Take 10 mg by mouth daily as needed. , Disp: , Rfl:  .  LORazepam (ATIVAN) 0.5 MG tablet, Take 1 tablet (0.5 mg total) by mouth 2 (two) times daily as needed for anxiety., Disp: 15 tablet, Rfl: 0 .  natalizumab (TYSABRI) 300 MG/15ML injection, Inject into the vein., Disp: , Rfl:  .  Vitamin D, Ergocalciferol, (DRISDOL) 50000 UNITS CAPS capsule, Take 1 capsule weekly until gone., Disp: 12 capsule, Rfl: 0 .  budesonide (RHINOCORT AQUA) 32 MCG/ACT nasal spray, Place 2 sprays into both nostrils as needed. , Disp: , Rfl:  .  fluticasone (CUTIVATE) 0.05 % cream, Apply 1 application topically daily as needed., Disp: , Rfl:  .  meclizine (ANTIVERT) 25 MG tablet, Take 25 mg by mouth 3 (three) times daily as needed for dizziness., Disp: , Rfl:  .  methylPREDNISolone sodium succinate (SOLU-MEDROL) 1000 MG injection, Inject 1,000 mg into the vein once., Disp: , Rfl:   PAST MEDICAL HISTORY: No past medical history on file.  PAST SURGICAL HISTORY: No past surgical history on file.  FAMILY HISTORY: Family History  Problem Relation Age of Onset  . Diabetes Father   . Hypertension Father     SOCIAL HISTORY:  History   Social History  . Marital Status: Single    Spouse Name: N/A  . Number of Children: N/A  . Years of Education: N/A   Occupational History  . unemployeed     Social History Main Topics  . Smoking status: Never Smoker   . Smokeless tobacco: Never Used  . Alcohol Use: No  . Drug Use: No  . Sexual Activity: Yes    Birth Control/ Protection: Pill   Other Topics Concern  . Not on file   Social History Narrative   Lives with mother    Drinks soda daily (8-10 ounces)     PHYSICAL EXAM  Filed Vitals:   05/26/15 0841  BP: 112/82  Pulse: 72  Resp: 16  Height: 5\' 2"  (1.575 m)  Weight: 193 lb 6.4 oz (87.726 kg)    Body mass index is 35.36 kg/(m^2).   General: The patient is well-developed and well-nourished and in no acute  distress  Skin: Extremities are without significant edema.  Musculoskeletal:  Back is nontender  Neurologic Exam  Mental status: The patient is alert and oriented x 3 at the time of the examination. The patient has apparent normal recent and remote memory, with an apparently normal attention span and concentration ability.   Speech is normal.  Cranial nerves: Extraocular movements are full.    Facial symmetry is present. There is good facial sensation to soft touch bilaterally.Facial strength is normal.  Trapezius and sternocleidomastoid strength is normal. No dysarthria is noted.  The tongue is midline, and the patient has symmetric elevation of the soft palate. No obvious hearing deficits are noted.  Motor:  Muscle bulk is normal.   Tone is slightly increased on left arm. Strength is  5 / 5 in right arm and legs but 4+/5 in left arm with decreased arm rolling and drift also observed.   .   Sensory: Sensory testing shows reduced vibratory sensation in the left arm and leg. Temperature and touch sensation were symmetric today..  Coordination: Cerebellar testing reveals good finger-nose-finger on the right but reduced on left.     Gait and station: Station is normal.   Gait is normal. Tandem gait is mildly wide. Romberg is positive.   Reflexes: Deep tendon reflexes are symmetric and increased bilaterally with spread at the knees and 2 beats non-sustained clonus at ankles.    Marland Kitchen    DIAGNOSTIC DATA (LABS, IMAGING, TESTING) - I reviewed patient records, labs, notes, testing and imaging myself where available.  Lab Results  Component Value Date   WBC 6.1 03/31/2015   HGB 13.4 03/31/2015   HCT 38.6 03/31/2015   MCV 90.6 03/31/2015   PLT 265 03/31/2015      Component Value Date/Time   NA 140 03/31/2015 1015   K 3.5 03/31/2015 1015   CL 109 03/31/2015 1015   CO2 23 03/31/2015 1015   GLUCOSE 94 03/31/2015 1015   BUN <5* 03/31/2015 1015   CREATININE 0.70 03/31/2015 1015   CALCIUM  9.3 03/31/2015 1015   PROT 6.6 03/31/2015 1015   ALBUMIN 3.7 03/31/2015 1015   AST 20 03/31/2015 1015   ALT 16 03/31/2015 1015   ALKPHOS 38* 03/31/2015 1015   BILITOT 0.5 03/31/2015 1015   GFRNONAA >90 03/31/2015 1015   GFRAA >90 03/31/2015 1015   No results found.    ASSESSMENT AND PLAN  Multiple sclerosis  Numbness  Ataxic gait  Other fatigue  Depression with anxiety  Weakness of left arm   1.   Continue Tysabri every 4 weeks. Her next infusion is 06/19/2015. 2.   I will have her get 3 days of IV Solu-Medrol because of the aspiration that she is experiencing causing more weakness in the left arm. 3.   She will be written out of work for the rest of the week. 4.   We discussed her mood issues. An antidepressant was offered but she would prefer to wait.   I asked her to please let me know if it was for mood continues to worsen 5.  She would also prefer to wait for a bladder medication. If she changes her mind, she will let us know.  She will return to see me in 3 months for regular visits and call sooner if she has new or worsening symptoms. She will continue to come in monthly for the IV infusions.  Richard A. Epimenio Foot, MD, PhD 05/26/2015, 8:51 AM Certified in Neurology, Clinical Neurophysiology, Sleep Medicine, Pain Medicine and Neuroimaging  [facility] [address] [place], [place] [address] [phone]

## 2015-05-31 ENCOUNTER — Telehealth: Payer: Self-pay | Admitting: Neurology

## 2015-05-31 NOTE — Telephone Encounter (Signed)
I have spoken with Selena Batten and advised that [facility] will precert Tysabri infusions.  I have printed task off and given it to [place] in the infusion suite.  Kim verbalized understanding of same/fim

## 2015-05-31 NOTE — Telephone Encounter (Signed)
Patient called stating she received a letter from [facility]stating the natalizumab (TYSABRI) 300 MG/15ML injection needs  pre-certification. Please call and advise. Patient can be reached at [phone].

## 2015-06-08 ENCOUNTER — Ambulatory Visit (INDEPENDENT_AMBULATORY_CARE_PROVIDER_SITE_OTHER): Payer: BLUE CROSS/BLUE SHIELD | Admitting: Family Medicine

## 2015-06-08 ENCOUNTER — Encounter: Payer: Self-pay | Admitting: Family Medicine

## 2015-06-08 VITALS — BP 114/78 | HR 101 | Temp 99.4°F | Ht 63.0 in | Wt 197.0 lb

## 2015-06-08 DIAGNOSIS — Z30011 Encounter for initial prescription of contraceptive pills: Secondary | ICD-10-CM | POA: Diagnosis not present

## 2015-06-08 DIAGNOSIS — B079 Viral wart, unspecified: Secondary | ICD-10-CM | POA: Insufficient documentation

## 2015-06-08 DIAGNOSIS — G35 Multiple sclerosis: Secondary | ICD-10-CM | POA: Diagnosis not present

## 2015-06-08 DIAGNOSIS — B078 Other viral warts: Secondary | ICD-10-CM

## 2015-06-08 MED ORDER — DROSPIRENONE-ETHINYL ESTRADIOL 3-0.03 MG PO TABS: 1.0000 | ORAL_TABLET | Freq: Every day | ORAL | Status: DC

## 2015-06-08 NOTE — Patient Instructions (Addendum)
Try Duofilm (over-the-counter) and paint on to the site every day, cover with bandaid or scotch tape and it should dissolve gradually over one month Talk with your neurologist about the birth control pills Return when needed, I am here when needed

## 2015-06-08 NOTE — Assessment & Plan Note (Signed)
She will check with neurologist to see if any reasons she should not take this; we reviewed the neurologist's note and the OCP was the first med in the list of meds there, so he is aware she takes it; discussed risk of stroke, heart attack, blood clot; if not sexually active, consider if risk of taking is worth the benefit; she does have some benefit in terms of cramping; rx written

## 2015-06-08 NOTE — Progress Notes (Signed)
BP 114/78 mmHg  Pulse 101  Temp(Src) 99.4 F (37.4 C)  Ht 5\' 3"  (1.6 m)  Wt 197 lb (89.359 kg)  BMI 34.91 kg/m2  SpO2 99%  LMP 05/28/2015 (Exact Date)   Subjective:    Patient ID: Tracey Lindsey, female    DOB: 06-27-1991, 24 y.o.   MRN: [id number]  HPI: Tracey Lindsey is a 24 y.o. female  Chief Complaint  Patient presents with  . Contraception    needs refill on BCP  . Knot on Thumb   She was noticing more weakness on the left side; had a relapse of the MS; improving now after treatments Spasticity improving after treatments; numbness has stuck around; right-handed, but left hand involved; no vision problems  Taking time off, had gone from part time to full time and now not working  She needs a refill of birth control, Ocella 3 mg/0.03 mg; she does have periods when due; she asked her neurologist about birth control and MS; she is going to ask if any problems when she gets her treatment; no hx of blood clots; she does not have migraine headaces; no smoking; last pap smear was November 2015, normal; next Nov 2018  We reviewed her neurologist note; the OCP was first on his list of medicines and no concerns voiced on the note; she will ask next week in addition  Periods are tolerable; on the OCPs, the cramps are better; she has taken a break off OCPs but cramps got worse  Knot on the left thumb, 3 weeks duration; doesn't itch; sore to the touch, has not really tried to drain anything; no red streaks; gets occasional dishidrotic eczema  Relevant past medical, surgical, family and social history reviewed and updated as indicated. Interim medical history since our last visit reviewed. Allergies and medications reviewed and updated.  Review of Systems  Per HPI unless specifically indicated above     Objective:    BP 114/78 mmHg  Pulse 101  Temp(Src) 99.4 F (37.4 C)  Ht 5\' 3"  (1.6 m)  Wt 197 lb (89.359 kg)  BMI 34.91 kg/m2  SpO2 99%  LMP 05/28/2015 (Exact Date)   Wt Readings from Last 3 Encounters:  06/08/15 197 lb (89.359 kg)  04/29/15 193 lb (87.544 kg)  05/26/15 193 lb 6.4 oz (87.726 kg)    Physical Exam  Constitutional: She appears well-developed and well-nourished. No distress.  Eyes: EOM are normal. No scleral icterus.  Neck: No thyromegaly present.  Cardiovascular: Normal rate.   Pulmonary/Chest: Effort normal.  Abdominal: She exhibits no distension.  Skin: Lesion (papular lesions with verrucous surface right radial aspect thumb) noted. No pallor.  Psychiatric: She has a normal mood and affect. Her behavior is normal. Judgment and thought content normal.      Assessment & Plan:   Problem List Items Addressed This Visit      Nervous and Auditory   MS (multiple sclerosis)    Seeing Dr. Epimenio Foot neurologist; she will check with him next week at treatment about any other info related to OCPs        Musculoskeletal and Integument   Verruca vulgaris    duofilm application daily for one month should take care of this, written down in AVS        Other   Oral contraceptive prescribed - Primary    She will check with neurologist to see if any reasons she should not take this; we reviewed the neurologist's note and the OCP was  the first med in the list of meds there, so he is aware she takes it; discussed risk of stroke, heart attack, blood clot; if not sexually active, consider if risk of taking is worth the benefit; she does have some benefit in terms of cramping; rx written         Follow up plan: No Follow-up on file.  Prn, she thinks she will try to return over Christmas break

## 2015-06-08 NOTE — Assessment & Plan Note (Signed)
Seeing Dr. Epimenio Foot neurologist; she will check with him next week at treatment about any other info related to [facility]

## 2015-06-08 NOTE — Assessment & Plan Note (Signed)
duofilm application daily for one month should take care of this, written down in AVS

## 2015-06-22 ENCOUNTER — Other Ambulatory Visit: Payer: Self-pay | Admitting: Neurology

## 2015-08-02 ENCOUNTER — Telehealth: Payer: Self-pay | Admitting: Neurology

## 2015-08-02 NOTE — Telephone Encounter (Signed)
Pt called and stated that she is going to need a report to register for disability support services stating her dx, how [place] she has had MS and just information to help cover her.  She wants to make sure in the event she has a relapse they are aware of her condition where they won't hold it against her for attendance purposes.  Fax : [phone]   If she needs sign any release form, please let her know.  She can be reached at [phone].

## 2015-08-02 NOTE — Telephone Encounter (Signed)
Spoke to [place] - she will need a letter stating she has MS along with the date she was diagnosed.  She will also need it to state that in the event of a relapse that she will need to be accommodated by [employer] student services.  She is aware that Dr. Epimenio Foot will not be back in the office until 08/09/15.

## 2015-08-03 ENCOUNTER — Encounter: Payer: Self-pay | Admitting: *Deleted

## 2015-08-03 NOTE — Telephone Encounter (Signed)
I have spoken with Tracey Lindsey and offered to writte a letter stating she is under the care of RAS for MS, and fax that letter today, or wait until RAS is back in the office and have him write the letter.  She sts. she would like me to go ahead with the letter today and fax it in.  I have done this/fim

## 2015-08-30 ENCOUNTER — Ambulatory Visit: Payer: BLUE CROSS/BLUE SHIELD | Admitting: Neurology

## 2015-08-31 ENCOUNTER — Telehealth: Payer: Self-pay | Admitting: Neurology

## 2015-08-31 MED ORDER — METHYLPREDNISOLONE 4 MG PO TBPK
ORAL_TABLET | ORAL | Status: DC
Start: 2015-08-31 — End: 2015-11-14

## 2015-08-31 NOTE — Telephone Encounter (Signed)
Patient is calling because for the past 18 hours she has had a really bad headache which has disturbed her sleep and is very painful. Please call and discuss. Thank you.

## 2015-08-31 NOTE — Telephone Encounter (Signed)
I have spoken with Tracey Lindsey this afternoon.  She sts. she had onset of throbbing h/a, frontal region, yesterday afternoon.  Sts. h/a interfered with sleep last night, and is about the same today as it was yesterday.  No better, no worse.  No relief with otc meds.  Denies nasal congestion. Will check with RAS and call her back/fim

## 2015-08-31 NOTE — Telephone Encounter (Signed)
I have spoken with Selena Batten again this afternoon, and per RAS, have offered 2 tx. options for h/a: 1--Toradol 60mg  IM here at the office today, or 2--Medrol dose pk.  She is uncertain which would be best to try.  I have provided reassurance that either are appropriate choices, and it just depends on which would be the most convenient for her.  Rx. escribed to [facility] in [place] per her request/fim

## 2015-09-08 ENCOUNTER — Telehealth: Payer: Self-pay | Admitting: Family Medicine

## 2015-09-08 DIAGNOSIS — B079 Viral wart, unspecified: Secondary | ICD-10-CM

## 2015-09-08 DIAGNOSIS — B078 Other viral warts: Secondary | ICD-10-CM

## 2015-09-08 NOTE — Telephone Encounter (Signed)
I entered the referral to dermatologist

## 2015-09-08 NOTE — Telephone Encounter (Signed)
Patient notified. She currently goes to [facility].

## 2015-09-08 NOTE — Telephone Encounter (Signed)
Routing to provider  

## 2015-09-08 NOTE — Assessment & Plan Note (Signed)
Failed OTC meds; refer to derm at patient's request

## 2015-09-08 NOTE — Telephone Encounter (Signed)
Pt called stated she had seen Dr. Sherie Don this summer for a wart, she recommended OTC meds, pt has since been advised to see a dermatologist. Please call pt and advise what she needs to do next. Please call pt @ [phone]. Thanks.

## 2015-09-26 ENCOUNTER — Telehealth: Payer: Self-pay | Admitting: Family Medicine

## 2015-09-26 MED ORDER — FLUCONAZOLE 150 MG PO TABS: 150.0000 mg | ORAL_TABLET | Freq: Once | ORAL | Status: DC

## 2015-09-26 NOTE — Telephone Encounter (Signed)
patient notified

## 2015-09-26 NOTE — Telephone Encounter (Signed)
Yes, that's fine; I sent the Rx

## 2015-09-26 NOTE — Telephone Encounter (Signed)
Routing to provider  

## 2015-09-26 NOTE — Telephone Encounter (Signed)
Pt went to dentist and was given an antibiotic(amoxicillan) and now has a yeast infection and she would like to know if she can have diflucan called in to [facility] on [address]

## 2015-10-12 ENCOUNTER — Other Ambulatory Visit: Payer: Self-pay | Admitting: Family Medicine

## 2015-10-12 ENCOUNTER — Telehealth: Payer: Self-pay | Admitting: Neurology

## 2015-10-12 NOTE — Telephone Encounter (Signed)
Patient is calling to see if she should get a flu shot. Please call and advise. Thank you.

## 2015-10-12 NOTE — Telephone Encounter (Signed)
I have spoken with Tracey Lindsey and advised ok to received flu vaccine as [place] as it is a dead vaccine, which the inj. usually is.  Advised her to always confirm that she is getting a dead vaccine, and to time vaccine so that it falls in the middle of Ty infusions, so if she has a fever/mild illness from vaccine it will not interfere with Tysabri.   She verbalized understanding of same.Marland KitchenChucky May

## 2015-10-24 ENCOUNTER — Encounter: Payer: Self-pay | Admitting: *Deleted

## 2015-11-01 ENCOUNTER — Other Ambulatory Visit: Payer: Self-pay | Admitting: *Deleted

## 2015-11-01 MED ORDER — LORAZEPAM 0.5 MG PO TABS: 0.5000 mg | ORAL_TABLET | Freq: Two times a day (BID) | ORAL | Status: DC | PRN

## 2015-11-01 NOTE — Telephone Encounter (Signed)
Pt. in for Tysabri infusion, requested Ativan rx. faxed to [facility] in [place].  Rx. printed, on RAS ledge for sig, then will fax/fim

## 2015-11-14 ENCOUNTER — Encounter: Payer: Self-pay | Admitting: Family Medicine

## 2015-11-14 ENCOUNTER — Emergency Department
Admission: EM | Admit: 2015-11-14 | Discharge: 2015-11-14 | Disposition: A | Discharge status: Home / Self Care | Payer: BLUE CROSS/BLUE SHIELD | Attending: Emergency Medicine | Admitting: Emergency Medicine

## 2015-11-14 ENCOUNTER — Ambulatory Visit (INDEPENDENT_AMBULATORY_CARE_PROVIDER_SITE_OTHER): Payer: BLUE CROSS/BLUE SHIELD | Admitting: Family Medicine

## 2015-11-14 ENCOUNTER — Encounter: Payer: Self-pay | Admitting: *Deleted

## 2015-11-14 VITALS — BP 116/79 | HR 92 | Temp 98.1°F | Wt 202.0 lb

## 2015-11-14 DIAGNOSIS — Y998 Other external cause status: Secondary | ICD-10-CM | POA: Insufficient documentation

## 2015-11-14 DIAGNOSIS — S299XXA Unspecified injury of thorax, initial encounter: Secondary | ICD-10-CM | POA: Diagnosis present

## 2015-11-14 DIAGNOSIS — A499 Bacterial infection, unspecified: Secondary | ICD-10-CM

## 2015-11-14 DIAGNOSIS — E669 Obesity, unspecified: Secondary | ICD-10-CM | POA: Diagnosis not present

## 2015-11-14 DIAGNOSIS — G35 Multiple sclerosis: Secondary | ICD-10-CM

## 2015-11-14 DIAGNOSIS — Y92410 Unspecified street and highway as the place of occurrence of the external cause: Secondary | ICD-10-CM | POA: Diagnosis not present

## 2015-11-14 DIAGNOSIS — Y9389 Activity, other specified: Secondary | ICD-10-CM | POA: Insufficient documentation

## 2015-11-14 DIAGNOSIS — Z113 Encounter for screening for infections with a predominantly sexual mode of transmission: Secondary | ICD-10-CM | POA: Diagnosis not present

## 2015-11-14 DIAGNOSIS — E559 Vitamin D deficiency, unspecified: Secondary | ICD-10-CM

## 2015-11-14 DIAGNOSIS — Z0000 Encounter for general adult medical examination without abnormal findings: Secondary | ICD-10-CM | POA: Insufficient documentation

## 2015-11-14 DIAGNOSIS — R350 Frequency of micturition: Secondary | ICD-10-CM | POA: Insufficient documentation

## 2015-11-14 DIAGNOSIS — Z79899 Other long term (current) drug therapy: Secondary | ICD-10-CM | POA: Insufficient documentation

## 2015-11-14 DIAGNOSIS — S239XXA Sprain of unspecified parts of thorax, initial encounter: Secondary | ICD-10-CM | POA: Diagnosis not present

## 2015-11-14 DIAGNOSIS — N760 Acute vaginitis: Secondary | ICD-10-CM

## 2015-11-14 DIAGNOSIS — N898 Other specified noninflammatory disorders of vagina: Secondary | ICD-10-CM

## 2015-11-14 DIAGNOSIS — B9689 Other specified bacterial agents as the cause of diseases classified elsewhere: Secondary | ICD-10-CM

## 2015-11-14 LAB — UA/M W/RFLX CULTURE, ROUTINE
BILIRUBIN UA: NEGATIVE
Glucose, UA: NEGATIVE
KETONES UA: NEGATIVE
LEUKOCYTES UA: NEGATIVE
NITRITE UA: NEGATIVE
Protein, UA: NEGATIVE
RBC UA: NEGATIVE
SPEC GRAV UA: 1.02 (ref 1.005–1.030)
UUROB: 0.2 mg/dL (ref 0.2–1.0)
pH, UA: 8.5 — ABNORMAL HIGH (ref 5.0–7.5)

## 2015-11-14 LAB — WET PREP FOR TRICH, YEAST, CLUE
CLUE CELL EXAM: POSITIVE — AB
TRICHOMONAS EXAM: NEGATIVE
Yeast Exam: NEGATIVE

## 2015-11-14 MED ORDER — METRONIDAZOLE 500 MG PO TABS: 500.0000 mg | ORAL_TABLET | Freq: Two times a day (BID) | ORAL | Status: DC

## 2015-11-14 NOTE — Assessment & Plan Note (Addendum)
Check urine today, along with glucose (r/o diabetes)
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
## 2015-11-14 NOTE — Patient Instructions (Signed)
Try to use very gentle laundry detergent without any fragrances or perfumes for underclothes; also, no dryer sheets Rinse thoroughly during showers Pat dry in sensitive areas instead of rubbing with towels We'll let you know the lab results (should be back in a few days) I will recommend weight loss to help with any rubbing or chafing that may be causing part of the problem; that is easier said than done, I know Check out the information at familydoctor.org entitled "What It Takes to Lose Weight" Try to lose between 1-2 pounds per week by taking in fewer calories and burning off more calories You can succeed by limiting portions, limiting foods dense in calories and fat, becoming more active, and drinking 8 glasses of water a day (64 ounces) Don't skip meals, especially breakfast, as skipping meals may alter your metabolism Do not use over-the-counter weight loss pills or gimmicks that claim rapid weight loss A healthy BMI (or body mass index) is between 18.5 and 24.9 You can calculate your ideal BMI at the NIH website [employer] Your blood pressure today was excellent

## 2015-11-14 NOTE — Assessment & Plan Note (Signed)
Check level today; last level was 12.1 in May, 2016
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
## 2015-11-14 NOTE — Progress Notes (Signed)
BP 116/79 mmHg  Pulse 92  Temp(Src) 98.1 F (36.7 C)  Wt 202 lb (91.627 kg)  SpO2 100%  LMP 10/05/2015 (Approximate)   Subjective:    Patient ID: Tracey Lindsey, female    DOB: 21-Nov-1991, 24 y.o.   MRN: [id number]  HPI: Tracey Lindsey is a 24 y.o. female  Chief Complaint  Patient presents with  . Exposure to STD    she had unprotected sex, would like to be tested  . Vaginal Itching    She is having some external itching and chaffing.  . Immunizations    She is thinking about the flu shots   She has not had any sex for 5 months, but she just wants to get checked She had a yeast infection after antibiotics Not fishy odor, no itching; using Dove; no douching; little irritation externally; tried Desitin, relieved it at first; tried monistat chafing gel; she has not shaved in a while; she trims with electric, not wet She has not had any cracking, not much dysuria, may burn on the outside, not the urethral Has been tired She has been urination a lot; no strong odor, no blood in the urine; no low back pain  History of very low vitamin D, treated by Dr. Epimenio Foot in May with 50k iu vit D weekly for 12 weeks, then 4000 to 5000 iu daily Lab from Apr 04, 2015:  Ref Range 65mo ago    Vit D, 25-Hydroxy 30.0 - 100.0 ng/mL 12.1 (L)         Relevant past medical, surgical, family and social history reviewed and updated as indicated Diabetes runs on paternal side Interim medical history since our last visit reviewed. Allergies and medications reviewed and updated.  Review of Systems Per HPI unless specifically indicated above     Objective:    BP 116/79 mmHg  Pulse 92  Temp(Src) 98.1 F (36.7 C)  Wt 202 lb (91.627 kg)  SpO2 100%  LMP 10/05/2015 (Approximate)  Wt Readings from Last 3 Encounters:  11/14/15 202 lb (91.627 kg)  11/14/15 202 lb (91.627 kg)  06/08/15 197 lb (89.359 kg)   body mass index is 35.79 kg/(m^2).  Physical Exam  Constitutional: She appears  well-developed and well-nourished.  Weight up five pounds over last five months  Eyes: No scleral icterus.  Cardiovascular: Normal rate and regular rhythm.   Pulmonary/Chest: Effort normal and breath sounds normal.  Abdominal: Soft. She exhibits no distension. There is no tenderness.  Genitourinary: There is rash (very mild external erythema) on the right labia. There is no lesion on the right labia. There is rash (very mild external erythema) on the left labia. There is no lesion on the left labia. No bleeding in the vagina. Vaginal discharge (thin whitish, no odor) found.  Neurological:  Slight ptosis of left eye at times, delayed blinking  Skin: No pallor.  Psychiatric: She has a normal mood and affect.      Assessment & Plan:   Problem List Items Addressed This Visit      Nervous and Auditory   MS (multiple sclerosis) (HCC) (Chronic)    With slightly noticeable ptosis; seeing neurologist        Other   Vitamin D deficiency    Check level today; last level was 12.1 in May, 2016      Relevant Orders   VITAMIN D 25 Hydroxy (Vit-D Deficiency, Fractures) (Completed)   Urinary frequency - Primary    Check urine today, along with  glucose (r/o diabetes)      Relevant Orders   UA/M w/rflx Culture, Routine (Completed)   Preventative health care    Using code for yearly labs to be drawn today (lipids, glucose, etc.)      Relevant Orders   Lipid Panel w/o Chol/HDL Ratio (Completed)   Comprehensive metabolic panel (Completed)   Obesity    Encouraged weight loss; see AVS for recommendations given       Other Visit Diagnoses    Screen for sexually transmitted diseases        so glad patient wants to be smart and safe; check labs    Relevant Orders    HIV antibody (Completed)    Hepatitis panel, acute (Completed)    RPR (Completed)    GC/Chlamydia Probe Amp (Completed)    WET PREP FOR TRICH, YEAST, CLUE (Completed)    Bacterial vaginosis        explained diagnosis; NOT  sexually transmitted; treat with metronidazole    Relevant Medications    metroNIDAZOLE (FLAGYL) 500 MG tablet    Vaginal discharge        wet mount collected today; (+) clue cells, treat for BV       Follow up plan: No Follow-up on file.   Orders Placed This Encounter  Procedures  . GC/Chlamydia Probe Amp  . WET PREP FOR TRICH, YEAST, CLUE  . HIV antibody  . Hepatitis panel, acute  . RPR  . UA/M w/rflx Culture, Routine  . Lipid Panel w/o Chol/HDL Ratio  . Comprehensive metabolic panel  . VITAMIN D 25 Hydroxy (Vit-D Deficiency, Fractures)   Meds ordered this encounter  Medications  . metroNIDAZOLE (FLAGYL) 500 MG tablet    Sig: Take 1 tablet (500 mg total) by mouth 2 (two) times daily. No alcohol or cold medicines that contain alcohol while on this med    Dispense:  14 tablet    Refill:  0   An after-visit summary was printed and given to the patient at check-out.  Please see the patient instructions which may contain other information and recommendations beyond what is mentioned above in the assessment and plan.

## 2015-11-15 LAB — HEPATITIS PANEL, ACUTE
HEP A IGM: NEGATIVE
HEP B S AG: NEGATIVE
Hep B C IgM: NEGATIVE
Hep C Virus Ab: 0.1 s/co ratio (ref 0.0–0.9)

## 2015-11-15 LAB — COMPREHENSIVE METABOLIC PANEL
A/G RATIO: 2.2 (ref 1.1–2.5)
ALT: 13 IU/L (ref 0–32)
AST: 17 IU/L (ref 0–40)
Albumin: 4.6 g/dL (ref 3.5–5.5)
Alkaline Phosphatase: 48 IU/L (ref 39–117)
BILIRUBIN TOTAL: 0.5 mg/dL (ref 0.0–1.2)
BUN/Creatinine Ratio: 14 (ref 8–20)
BUN: 9 mg/dL (ref 6–20)
CALCIUM: 9.3 mg/dL (ref 8.7–10.2)
CHLORIDE: 101 mmol/L (ref 96–106)
CO2: 24 mmol/L (ref 18–29)
Creatinine, Ser: 0.63 mg/dL (ref 0.57–1.00)
GFR calc Af Amer: 145 mL/min/{1.73_m2} (ref >59)
GFR calc non Af Amer: 126 mL/min/{1.73_m2} (ref >59)
GLUCOSE: 73 mg/dL (ref 65–99)
Globulin, Total: 2.1 g/dL (ref 1.5–4.5)
POTASSIUM: 3.8 mmol/L (ref 3.5–5.2)
Sodium: 138 mmol/L (ref 134–144)
TOTAL PROTEIN: 6.7 g/dL (ref 6.0–8.5)

## 2015-11-15 LAB — LIPID PANEL W/O CHOL/HDL RATIO
CHOLESTEROL TOTAL: 175 mg/dL (ref 100–199)
HDL: 52 mg/dL (ref >39)
LDL CALC: 111 mg/dL — AB (ref 0–99)
TRIGLYCERIDES: 61 mg/dL (ref 0–149)
VLDL CHOLESTEROL CAL: 12 mg/dL (ref 5–40)

## 2015-11-15 LAB — RPR: RPR Ser Ql: NONREACTIVE

## 2015-11-15 LAB — VITAMIN D 25 HYDROXY (VIT D DEFICIENCY, FRACTURES): Vit D, 25-Hydroxy: 19.9 ng/mL — ABNORMAL LOW (ref 30.0–100.0)

## 2015-11-15 LAB — HIV ANTIBODY (ROUTINE TESTING W REFLEX): HIV Screen 4th Generation wRfx: NONREACTIVE

## 2015-11-16 LAB — GC/CHLAMYDIA PROBE AMP
Chlamydia trachomatis, NAA: NEGATIVE
Neisseria gonorrhoeae by PCR: NEGATIVE

## 2015-11-19 DIAGNOSIS — E669 Obesity, unspecified: Secondary | ICD-10-CM | POA: Insufficient documentation

## 2015-11-19 NOTE — Assessment & Plan Note (Signed)
Using code for yearly labs to be drawn today (lipids, glucose, etc.)

## 2015-11-19 NOTE — Assessment & Plan Note (Signed)
Encouraged weight loss; see AVS for recommendations given

## 2015-11-19 NOTE — Assessment & Plan Note (Signed)
With slightly noticeable ptosis; seeing neurologist

## 2015-11-20 ENCOUNTER — Encounter: Payer: Self-pay | Admitting: Family Medicine

## 2015-12-04 HISTORY — PX: WISDOM TOOTH EXTRACTION: SHX21

## 2016-01-24 ENCOUNTER — Other Ambulatory Visit: Payer: Self-pay | Admitting: *Deleted

## 2016-01-24 ENCOUNTER — Other Ambulatory Visit (INDEPENDENT_AMBULATORY_CARE_PROVIDER_SITE_OTHER): Payer: Self-pay

## 2016-01-24 DIAGNOSIS — Z79899 Other long term (current) drug therapy: Secondary | ICD-10-CM

## 2016-01-24 DIAGNOSIS — G35 Multiple sclerosis: Secondary | ICD-10-CM

## 2016-01-24 DIAGNOSIS — Z0289 Encounter for other administrative examinations: Secondary | ICD-10-CM

## 2016-01-25 LAB — CBC WITH DIFFERENTIAL/PLATELET
BASOS ABS: 0 10*3/uL (ref 0.0–0.2)
BASOS: 0 %
EOS (ABSOLUTE): 0.2 10*3/uL (ref 0.0–0.4)
Eos: 2 %
HEMOGLOBIN: 13.4 g/dL (ref 11.1–15.9)
Hematocrit: 39.2 % (ref 34.0–46.6)
IMMATURE GRANS (ABS): 0 10*3/uL (ref 0.0–0.1)
Immature Granulocytes: 0 %
LYMPHS ABS: 3.6 10*3/uL — AB (ref 0.7–3.1)
LYMPHS: 33 %
MCH: 31.4 pg (ref 26.6–33.0)
MCHC: 34.2 g/dL (ref 31.5–35.7)
MCV: 92 fL (ref 79–97)
Monocytes Absolute: 0.5 10*3/uL (ref 0.1–0.9)
Monocytes: 4 %
NEUTROS ABS: 6.5 10*3/uL (ref 1.4–7.0)
Neutrophils: 61 %
PLATELETS: 254 10*3/uL (ref 150–379)
RBC: 4.27 x10E6/uL (ref 3.77–5.28)
RDW: 13.5 % (ref 12.3–15.4)
WBC: 10.8 10*3/uL (ref 3.4–10.8)

## 2016-01-31 ENCOUNTER — Encounter: Payer: Self-pay | Admitting: *Deleted

## 2016-02-07 ENCOUNTER — Encounter: Payer: Self-pay | Admitting: Neurology

## 2016-02-07 ENCOUNTER — Ambulatory Visit (INDEPENDENT_AMBULATORY_CARE_PROVIDER_SITE_OTHER): Payer: BLUE CROSS/BLUE SHIELD | Admitting: Neurology

## 2016-02-07 VITALS — BP 108/60 | HR 66 | Resp 16 | Ht 62.0 in | Wt 195.4 lb

## 2016-02-07 DIAGNOSIS — G4700 Insomnia, unspecified: Secondary | ICD-10-CM

## 2016-02-07 DIAGNOSIS — R350 Frequency of micturition: Secondary | ICD-10-CM | POA: Diagnosis not present

## 2016-02-07 DIAGNOSIS — F418 Other specified anxiety disorders: Secondary | ICD-10-CM | POA: Diagnosis not present

## 2016-02-07 DIAGNOSIS — R200 Anesthesia of skin: Secondary | ICD-10-CM | POA: Diagnosis not present

## 2016-02-07 DIAGNOSIS — R260 Ataxic gait: Secondary | ICD-10-CM | POA: Diagnosis not present

## 2016-02-07 DIAGNOSIS — G35 Multiple sclerosis: Secondary | ICD-10-CM

## 2016-02-07 MED ORDER — SCOPOLAMINE 1 MG/3DAYS TD PT72: 1.0000 | MEDICATED_PATCH | TRANSDERMAL | Status: DC

## 2016-02-07 NOTE — Progress Notes (Signed)
[facility]  PATIENT: Tracey Lindsey DOB: 31-Jul-1991  REFERRING DOCTOR OR PCP:  Tracey Pulling Corbin Ade) SOURCE: Patient and ER records and PACS images  _________________________________   HISTORICAL  CHIEF COMPLAINT:  Chief Complaint  Patient presents with  . Multiple Sclerosis    Sts. she continues to tolerate Tysabri well.  JCV ab last checked 01-24-16 and was negative at 0.09.  Sts. occasionally when waking she feels like she has whole body tremors, which resolve once she is up and going.  Sts. temors are not visible./fim    HISTORY OF PRESENT ILLNESS:  Tracey Lindsey is a 25 year old woman recently diagnosed with MS.     She is on Tysabri and tolerates it well.  She is JCV Ab negative (0.09)   She denies any new exacerbation  Gait/strength/sensation:   She feels gait has actually improved over the last year.    Her mild left arm weakness is better.   The left leg stiffness has resolved.  Legs are strong.    She has not fallen.   She denies any numbness.     Bladder/bowel:  She has some urinary frequently.    1 -2 times nocturia.   No incontinence.  No recent UTI's.   No hesitancy.   Bowel is fine  Vision/ears:  Diplopia has resolved.     She has good VA and no difficulty with colors.  Fatigue/sleep:   She has mild but is able to work 40 hours and go to school.    She is better than last year.    and is doing better than a couple months ago.      She is sleeping well.  Mood/Cognition:   She notes some anxiety and moodiness and she feels a little more sad.   She has some anxiety, not any better than earlier  She denies any significant cognitive change.  Nausea:   She gets nausea if other people are driving her around (not when she drives).   She is concerned about seasickness on a planned cruise  MS History:   In retrospect, she had an episode of vertigo in 2014 with mild vision changes that improved.    In March 2016, she had vertigo that improved  after a couple weeks.  In late April, she had  numbness in the left foot. When she woke up the next morning, she noted that the numbness was higher up the left arm and the next day it also involved the left hand and she went to the emergency room. In the emergency room, she had an MRI of the brain that showed white matter foci were some for multiple sclerosis.   MRI of the brain with and without contrast 03/29/15 showed several posterior fossa lesions with one in the right middle cerebellar peduncle and several in the pons.   The middle cerebellar peduncle focus and 2 of the pontine foci enhanced. There are also several periventricular and deep white matter foci in the cerebral hemispheres.   MRI cervical spine shows one enhancing focus.       REVIEW OF SYSTEMS: Constitutional: No fevers, chills, sweats, or change in appetite Eyes: No visual changes, double vision, eye pain Ear, nose and throat: No hearing loss, ear pain, nasal congestion, sore throat Cardiovascular: No chest pain, palpitations Respiratory: No shortness of breath at rest or with exertion.   No wheezes GastrointestinaI: No nausea, vomiting, diarrhea, abdominal pain, fecal incontinence Genitourinary: Notes frequency, nocturia (x2)  Musculoskeletal: No neck pain, back pain Integumentary: No rash, pruritus, skin lesions Neurological: as above Psychiatric: as above  Endocrine: No palpitations, diaphoresis, change in appetite, change in weigh or increased thirst Hematologic/Lymphatic: No anemia, purpura, petechiae. Allergic/Immunologic: No itchy/runny eyes, nasal congestion, recent allergic reactions, rashes  ALLERGIES: No Known Allergies  HOME MEDICATIONS:  Current outpatient prescriptions:  .  budesonide (RHINOCORT AQUA) 32 MCG/ACT nasal spray, Place 2 sprays into both nostrils as needed. , Disp: , Rfl:  .  cholecalciferol (VITAMIN D) 1000 units tablet, Take 5,000 Units by mouth daily., Disp: , Rfl:  .  fluticasone  (CUTIVATE) 0.05 % cream, Apply 1 application topically daily as needed., Disp: , Rfl:  .  gabapentin (NEURONTIN) 300 MG capsule, Take 1-2 capsules at bedtime as needed., Disp: 60 capsule, Rfl: 3 .  loratadine (CLARITIN) 10 MG tablet, Take 10 mg by mouth daily as needed. , Disp: , Rfl:  .  LORazepam (ATIVAN) 0.5 MG tablet, Take 1 tablet (0.5 mg total) by mouth 2 (two) times daily as needed for anxiety., Disp: 15 tablet, Rfl: 0 .  meclizine (ANTIVERT) 25 MG tablet, Take 25 mg by mouth 3 (three) times daily as needed for dizziness., Disp: , Rfl:  .  MEGARED OMEGA-3 KRILL OIL PO, Take 900 mg by mouth., Disp: , Rfl:  .  metroNIDAZOLE (FLAGYL) 500 MG tablet, Take 1 tablet (500 mg total) by mouth 2 (two) times daily. No alcohol or cold medicines that contain alcohol while on this med, Disp: 14 tablet, Rfl: 0 .  natalizumab (TYSABRI) 300 MG/15ML injection, Inject into the vein., Disp: , Rfl:  .  OVER THE COUNTER MEDICATION, , Disp: , Rfl:  .  TURMERIC PO, Take by mouth., Disp: , Rfl:   PAST MEDICAL HISTORY: Past Medical History  Diagnosis Date  . MS (multiple sclerosis) [facility]) April 2016    PAST SURGICAL HISTORY: History reviewed. No pertinent past surgical history.  FAMILY HISTORY: Family History  Problem Relation Age of Onset  . Diabetes Father   . Hypertension Father   . Stroke Father   . Cancer Mother     lung  . Hyperlipidemia Mother   . Lung disease Mother     SOCIAL HISTORY:  Social History   Social History  . Marital Status: Single    Spouse Name: N/A  . Number of Children: N/A  . Years of Education: N/A   Occupational History  . unemployeed     Social History Main Topics  . Smoking status: Never Smoker   . Smokeless tobacco: Never Used  . Alcohol Use: No  . Drug Use: No  . Sexual Activity: Yes    Birth Control/ Protection: Pill   Other Topics Concern  . Not on file   Social History Narrative   Lives with mother    Drinks soda daily (8-10 ounces)      PHYSICAL EXAM  Filed Vitals:   02/07/16 1103  BP: 108/60  Pulse: 66  Resp: 16  Height: 5\' 2"  (1.575 m)  Weight: 195 lb 6.4 oz (88.633 kg)    Body mass index is 35.73 kg/(m^2).   General: The patient is well-developed and well-nourished and in no acute distress  Skin: Extremities are without significant edema.  Musculoskeletal:  Back is nontender  Neurologic Exam  Mental status: The patient is alert and oriented x 3 at the time of the examination. The patient has apparent normal recent and remote memory, with an apparently normal attention span and concentration ability.  Speech is normal.  Cranial nerves: Extraocular movements are full.    Facial symmetry is present. There is good facial sensation to soft touch bilaterally.Facial strength is normal.  Trapezius and sternocleidomastoid strength is normal. No dysarthria is noted.  The tongue is midline, and the patient has symmetric elevation of the soft palate. No obvious hearing deficits are noted.  Motor:  Muscle bulk is normal.   Tone is slightly increased on left arm. Strength is  5 / 5 in right arm and legs but 4+/5 in left arm with decreased arm rolling and drift also observed.   .   Sensory: Sensory testing shows reduced vibratory sensation in the left arm and leg. Temperature and touch sensation were symmetric today..  Coordination: Cerebellar testing reveals good finger-nose-finger on the right but reduced on left.     Gait and station: Station is normal.   Gait is normal. Tandem gait is mildly wide. Romberg is positive.   Reflexes: Deep tendon reflexes are symmetric and increased bilaterally with spread at the knees and 2 beats non-sustained clonus at ankles.    Marland Kitchen    DIAGNOSTIC DATA (LABS, IMAGING, TESTING) - I reviewed patient records, labs, notes, testing and imaging myself where available.  Lab Results  Component Value Date   WBC 10.8 01/24/2016   HGB 13.4 03/31/2015   HCT 39.2 01/24/2016   MCV 92  01/24/2016   PLT 254 01/24/2016      Component Value Date/Time   NA 138 11/14/2015 1417   NA 140 03/31/2015 1015   K 3.8 11/14/2015 1417   CL 101 11/14/2015 1417   CO2 24 11/14/2015 1417   GLUCOSE 73 11/14/2015 1417   GLUCOSE 94 03/31/2015 1015   BUN 9 11/14/2015 1417   BUN <5* 03/31/2015 1015   CREATININE 0.63 11/14/2015 1417   CALCIUM 9.3 11/14/2015 1417   PROT 6.7 11/14/2015 1417   PROT 6.6 03/31/2015 1015   ALBUMIN 4.6 11/14/2015 1417   ALBUMIN 3.7 03/31/2015 1015   AST 17 11/14/2015 1417   ALT 13 11/14/2015 1417   ALKPHOS 48 11/14/2015 1417   BILITOT 0.5 11/14/2015 1417   BILITOT 0.5 03/31/2015 1015   GFRNONAA 126 11/14/2015 1417   GFRAA 145 11/14/2015 1417   No results found.    ASSESSMENT AND PLAN  MS (multiple sclerosis) (HCC)  Ataxic gait  Numbness  Urinary frequency  Insomnia  Depression with anxiety   1.   Continue Tysabri every 4 weeks. She is JCV Ab negative. 2.   scopolamine patch for cruise. 3.   Mood and insomnia re much better. 4.   Stay active and exercise as tolerated.   She will return to see me in 4 months for regular visits and call sooner if she has new or worsening symptoms. She will continue to come in monthly for the IV infusions.  Richard A. Epimenio Foot, MD, PhD 02/07/2016, 11:23 AM Certified in Neurology, Clinical Neurophysiology, Sleep Medicine, Pain Medicine and Neuroimaging  [facility] [address], [address] [place], [place] [address] [phone]

## 2016-02-21 ENCOUNTER — Encounter: Payer: Self-pay | Admitting: Neurology

## 2016-04-17 ENCOUNTER — Ambulatory Visit: Payer: BLUE CROSS/BLUE SHIELD | Admitting: Family Medicine

## 2016-04-19 ENCOUNTER — Telehealth: Payer: Self-pay | Admitting: Neurology

## 2016-04-19 NOTE — Telephone Encounter (Signed)
Jenny/[employer] [phone] opt 2 called said natalizumab (TYSABRI) 300 MG/15ML injection will need PA, it expires on 05/14/16. Case# [id number] said she can help with PA.

## 2016-04-19 NOTE — Telephone Encounter (Signed)
Message printed and given to Tina in the infusion suite/fim 

## 2016-05-18 ENCOUNTER — Ambulatory Visit (INDEPENDENT_AMBULATORY_CARE_PROVIDER_SITE_OTHER): Payer: BLUE CROSS/BLUE SHIELD | Admitting: Family Medicine

## 2016-05-18 ENCOUNTER — Encounter: Payer: Self-pay | Admitting: Family Medicine

## 2016-05-18 VITALS — BP 122/72 | HR 98 | Temp 98.8°F | Resp 16 | Wt 196.0 lb

## 2016-05-18 DIAGNOSIS — Z114 Encounter for screening for human immunodeficiency virus [HIV]: Secondary | ICD-10-CM | POA: Diagnosis not present

## 2016-05-18 DIAGNOSIS — B373 Candidiasis of vulva and vagina: Secondary | ICD-10-CM | POA: Diagnosis not present

## 2016-05-18 DIAGNOSIS — N926 Irregular menstruation, unspecified: Secondary | ICD-10-CM | POA: Diagnosis not present

## 2016-05-18 DIAGNOSIS — B3731 Acute candidiasis of vulva and vagina: Secondary | ICD-10-CM

## 2016-05-18 DIAGNOSIS — Z3201 Encounter for pregnancy test, result positive: Secondary | ICD-10-CM

## 2016-05-18 DIAGNOSIS — Z113 Encounter for screening for infections with a predominantly sexual mode of transmission: Secondary | ICD-10-CM | POA: Diagnosis not present

## 2016-05-18 LAB — POCT URINE PREGNANCY: PREG TEST UR: POSITIVE — AB

## 2016-05-18 NOTE — Patient Instructions (Addendum)
We'll contact you about your lab results Contraception Choices Contraception (birth control) is the use of any methods or devices to prevent pregnancy. Below are some methods to help avoid pregnancy. HORMONAL METHODS   Contraceptive implant. This is a thin, plastic tube containing progesterone hormone. It does not contain estrogen hormone. Your health care provider inserts the tube in the inner part of the upper arm. The tube can remain in place for up to 3 years. After 3 years, the implant must be removed. The implant prevents the ovaries from releasing an egg (ovulation), thickens the cervical mucus to prevent sperm from entering the uterus, and thins the lining of the inside of the uterus.  Progesterone-only injections. These injections are given every 3 months by your health care provider to prevent pregnancy. This synthetic progesterone hormone stops the ovaries from releasing eggs. It also thickens cervical mucus and changes the uterine lining. This makes it harder for sperm to survive in the uterus.  Birth control pills. These pills contain estrogen and progesterone hormone. They work by preventing the ovaries from releasing eggs (ovulation). They also cause the cervical mucus to thicken, preventing the sperm from entering the uterus. Birth control pills are prescribed by a health care provider.Birth control pills can also be used to treat heavy periods.  Minipill. This type of birth control pill contains only the progesterone hormone. They are taken every day of each month and must be prescribed by your health care provider.  Birth control patch. The patch contains hormones similar to those in birth control pills. It must be changed once a week and is prescribed by a health care provider.  Vaginal ring. The ring contains hormones similar to those in birth control pills. It is left in the vagina for 3 weeks, removed for 1 week, and then a new one is put back in place. The patient must be  comfortable inserting and removing the ring from the vagina.A health care provider's prescription is necessary.  Emergency contraception. Emergency contraceptives prevent pregnancy after unprotected sexual intercourse. This pill can be taken right after sex or up to 5 days after unprotected sex. It is most effective the sooner you take the pills after having sexual intercourse. Most emergency contraceptive pills are available without a prescription. Check with your pharmacist. Do not use emergency contraception as your only form of birth control. BARRIER METHODS   Female condom. This is a thin sheath (latex or rubber) that is worn over the penis during sexual intercourse. It can be used with spermicide to increase effectiveness.  Female condom. This is a soft, loose-fitting sheath that is put into the vagina before sexual intercourse.  Diaphragm. This is a soft, latex, dome-shaped barrier that must be fitted by a health care provider. It is inserted into the vagina, along with a spermicidal jelly. It is inserted before intercourse. The diaphragm should be left in the vagina for 6 to 8 hours after intercourse.  Cervical cap. This is a round, soft, latex or plastic cup that fits over the cervix and must be fitted by a health care provider. The cap can be left in place for up to 48 hours after intercourse.  Sponge. This is a soft, circular piece of polyurethane foam. The sponge has spermicide in it. It is inserted into the vagina after wetting it and before sexual intercourse.  Spermicides. These are chemicals that kill or block sperm from entering the cervix and uterus. They come in the form of creams, jellies, suppositories, foam,  or tablets. They do not require a prescription. They are inserted into the vagina with an applicator before having sexual intercourse. The process must be repeated every time you have sexual intercourse. INTRAUTERINE CONTRACEPTION  Intrauterine device (IUD). This is a  T-shaped device that is put in a woman's uterus during a menstrual period to prevent pregnancy. There are 2 types:  Copper IUD. This type of IUD is wrapped in copper wire and is placed inside the uterus. Copper makes the uterus and fallopian tubes produce a fluid that kills sperm. It can stay in place for 10 years.  Hormone IUD. This type of IUD contains the hormone progestin (synthetic progesterone). The hormone thickens the cervical mucus and prevents sperm from entering the uterus, and it also thins the uterine lining to prevent implantation of a fertilized egg. The hormone can weaken or kill the sperm that get into the uterus. It can stay in place for 3-5 years, depending on which type of IUD is used. PERMANENT METHODS OF CONTRACEPTION  Female tubal ligation. This is when the woman's fallopian tubes are surgically sealed, tied, or blocked to prevent the egg from traveling to the uterus.  Hysteroscopic sterilization. This involves placing a small coil or insert into each fallopian tube. Your doctor uses a technique called hysteroscopy to do the procedure. The device causes scar tissue to form. This results in permanent blockage of the fallopian tubes, so the sperm cannot fertilize the egg. It takes about 3 months after the procedure for the tubes to become blocked. You must use another form of birth control for these 3 months.  Female sterilization. This is when the female has the tubes that carry sperm tied off (vasectomy).This blocks sperm from entering the vagina during sexual intercourse. After the procedure, the man can still ejaculate fluid (semen). NATURAL PLANNING METHODS  Natural family planning. This is not having sexual intercourse or using a barrier method (condom, diaphragm, cervical cap) on days the woman could become pregnant.  Calendar method. This is keeping track of the length of each menstrual cycle and identifying when you are fertile.  Ovulation method. This is avoiding sexual  intercourse during ovulation.  Symptothermal method. This is avoiding sexual intercourse during ovulation, using a thermometer and ovulation symptoms.  Post-ovulation method. This is timing sexual intercourse after you have ovulated. Regardless of which type or method of contraception you choose, it is important that you use condoms to protect against the transmission of sexually transmitted infections (STIs). Talk with your health care provider about which form of contraception is most appropriate for you.   This information is not intended to replace advice given to you by your health care provider. Make sure you discuss any questions you have with your health care provider.   Document Released: 11/19/2005 Document Revised: 11/24/2013 Document Reviewed: 05/14/2013 Elsevier Interactive Patient Education [employer]

## 2016-05-18 NOTE — Progress Notes (Signed)
BP 122/72 mmHg  Pulse 98  Temp(Src) 98.8 F (37.1 C) (Oral)  Resp 16  Wt 196 lb (88.905 kg)  SpO2 97%  LMP 01/29/2016   MD note: LMP was actually 04/09/2016   Subjective:    Patient ID: Tracey Lindsey, female    DOB: March 14, 1991, 25 y.o.   MRN: [id number]  HPI: Tracey Lindsey is a 25 y.o. female  Chief Complaint  Patient presents with  . Exposure to STD    wants to be tested  . positive pregnancy    LMP 04/09/16   Positive pregnancy test at home; some nausea, lots of fatigue; some cramping; nothing outrageous On Tysabri, which can cause birth defects, so she already has decided that if she is pregnant, she is going to have an abortion; she has chosen a pregnancy center in [place] where she plans to go; afterwards, she plans to restart the pill MS doing well; Dr. Leilani Merl is neurologist No abuse No sx of STDS, just wants testing Hx of chlamydia at age 20 She would like to get back on the pill; generic [place]a  Depression screen [facility] 2/9 05/18/2016  Decreased Interest 0  Down, Depressed, Hopeless 0  PHQ - 2 Score 0   Relevant past medical, surgical, family and social history reviewed Past Medical History  Diagnosis Date  . MS (multiple sclerosis) [facility]) April 2016   History reviewed. No pertinent past surgical history. Family History  Problem Relation Age of Onset  . Diabetes Father   . Hypertension Father   . Stroke Father   . Cancer Mother     lung  . Hyperlipidemia Mother   . Lung disease Mother    Social History  Substance Use Topics  . Smoking status: Never Smoker   . Smokeless tobacco: Never Used  . Alcohol Use: No   Interim medical history since last visit reviewed. Allergies and medications reviewed  Review of Systems Per HPI unless specifically indicated above     Objective:    BP 122/72 mmHg  Pulse 98  Temp(Src) 98.8 F (37.1 C) (Oral)  Resp 16  Wt 196 lb (88.905 kg)  SpO2 97%  LMP 01/29/2016  Wt Readings from Last 3 Encounters:  05/18/16  196 lb (88.905 kg)  02/07/16 195 lb 6.4 oz (88.633 kg)  11/14/15 202 lb (91.627 kg)    Physical Exam  Constitutional: She appears well-developed and well-nourished. No distress.  Cardiovascular: Normal rate and regular rhythm.   Pulmonary/Chest: Effort normal and breath sounds normal.  Genitourinary: There is no rash on the right labia. There is no rash on the left labia. Uterus is not enlarged and not tender. Cervix exhibits no motion tenderness, no discharge and no friability. No erythema or bleeding in the vagina. Vaginal discharge found.  Musculoskeletal: She exhibits no edema.  Psychiatric: She has a normal mood and affect.   Results for orders placed or performed in visit on 05/18/16  GC/Chlamydia Probe Amp  Result Value Ref Range   CT Probe RNA NOT DETECTED    GC Probe RNA NOT DETECTED   WET PREP BY MOLECULAR PROBE  Result Value Ref Range   Candida species POS (A) Negative   Trichomonas vaginosis NEG Negative   Gardnerella vaginalis NEG Negative  Acute Hep Panel & Hep B Surface Ab  Result Value Ref Range   Hepatitis B Surface Ag NEGATIVE NEGATIVE   Hep B C IgM NON REACTIVE NON REACTIVE   Hep B S Ab INDETER (A) NEGATIVE  Hep A IgM NON REACTIVE NON REACTIVE   HCV Ab NEGATIVE NEGATIVE  Hepatitis panel, acute  Result Value Ref Range   Hepatitis B Surface Ag NEGATIVE NEGATIVE   HCV Ab NEGATIVE NEGATIVE   Hep B C IgM NON REACTIVE NON REACTIVE   Hep A IgM NON REACTIVE NON REACTIVE  HIV antibody  Result Value Ref Range   HIV 1&2 Ab, 4th Generation  NONREACTIVE  Hepatitis B surface antibody  Result Value Ref Range   Hepatitis B-Post 8.7 mIU/mL  RPR  Result Value Ref Range   RPR Ser Ql NON REAC NON REAC  POCT urine pregnancy  Result Value Ref Range   Preg Test, Ur Positive (A) Negative      Assessment & Plan:   Problem List Items Addressed This Visit      Other   Missed period - Primary    Urine hCG done today; positive; she plans to have an abortion       Relevant Orders   POCT urine pregnancy (Completed)    Other Visit Diagnoses    Screen for sexually transmitted diseases        testing done today at her request    Relevant Orders    Wet prep, genital    RPR    GC/chlamydia probe amp, urine    Acute Hep Panel & Hep B Surface Ab (Completed)    Screening for HIV (human immunodeficiency virus)        testing today today at her request    Relevant Orders    HIV antibody    Positive urine pregnancy test        patient plans to have an abortion; she has already chosen the clinic    Yeast infection of the vagina        treat with diflucan    Relevant Medications    fluconazole (DIFLUCAN) 150 MG tablet       Follow up plan: No Follow-up on file.  An after-visit summary was printed and given to the patient at check-out.  Please see the patient instructions which may contain other information and recommendations beyond what is mentioned above in the assessment and plan.  Meds ordered this encounter  Medications  . fluconazole (DIFLUCAN) 150 MG tablet    Sig: Take 1 tablet (150 mg total) by mouth once.    Dispense:  1 tablet    Refill:  0    Orders Placed This Encounter  Procedures  . Wet prep, genital  . GC/Chlamydia Probe Amp  . WET PREP BY MOLECULAR PROBE  . HIV antibody  . RPR  . GC/chlamydia probe amp, urine  . Acute Hep Panel & Hep B Surface Ab  . Hepatitis panel, acute  . HIV antibody  . Hepatitis B surface antibody  . RPR  . POCT urine pregnancy

## 2016-05-19 LAB — HEPATITIS PANEL, ACUTE
HCV Ab: NEGATIVE
Hep A IgM: NONREACTIVE
Hep B C IgM: NONREACTIVE
Hepatitis B Surface Ag: NEGATIVE

## 2016-05-19 LAB — GC/CHLAMYDIA PROBE AMP
CT PROBE, AMP APTIMA: NOT DETECTED
GC PROBE AMP APTIMA: NOT DETECTED

## 2016-05-19 LAB — ACUTE HEP PANEL AND HEP B SURFACE AB
HCV AB: NEGATIVE
HEP A IGM: NONREACTIVE
HEP B C IGM: NONREACTIVE
HEP B S AG: NEGATIVE

## 2016-05-19 LAB — HEPATITIS B SURFACE ANTIBODY, QUANTITATIVE: HEPATITIS B-POST: 8.7 m[IU]/mL

## 2016-05-19 LAB — WET PREP BY MOLECULAR PROBE
CANDIDA SPECIES: POSITIVE — AB
Gardnerella vaginalis: NEGATIVE
TRICHOMONAS VAG: NEGATIVE

## 2016-05-19 LAB — RPR

## 2016-05-19 MED ORDER — FLUCONAZOLE 150 MG PO TABS: 150.0000 mg | ORAL_TABLET | Freq: Once | ORAL | Status: DC

## 2016-05-19 NOTE — Assessment & Plan Note (Signed)
Urine hCG done today; positive; she plans to have an abortion

## 2016-05-21 LAB — HIV ANTIBODY (ROUTINE TESTING W REFLEX): HIV 1&2 Ab, 4th Generation: NONREACTIVE

## 2016-06-08 ENCOUNTER — Ambulatory Visit: Payer: BLUE CROSS/BLUE SHIELD | Admitting: Neurology

## 2016-06-13 ENCOUNTER — Telehealth: Payer: Self-pay | Admitting: Family Medicine

## 2016-06-13 MED ORDER — DROSPIRENONE-ETHINYL ESTRADIOL 3-0.03 MG PO TABS: 1.0000 | ORAL_TABLET | Freq: Every day | ORAL | Status: DC

## 2016-06-13 NOTE — Telephone Encounter (Signed)
Pt called states she had abortion and went back today for confirmation.  She would like to get back on birth control preferably something she has took in past?

## 2016-06-13 NOTE — Telephone Encounter (Signed)
Rx sent 

## 2016-06-13 NOTE — Telephone Encounter (Signed)
Patient would ask Dr. Sherie Don a question regarding birth control.

## 2016-07-09 ENCOUNTER — Other Ambulatory Visit (INDEPENDENT_AMBULATORY_CARE_PROVIDER_SITE_OTHER): Payer: Self-pay

## 2016-07-09 ENCOUNTER — Other Ambulatory Visit: Payer: Self-pay | Admitting: *Deleted

## 2016-07-09 DIAGNOSIS — Z79899 Other long term (current) drug therapy: Secondary | ICD-10-CM

## 2016-07-09 DIAGNOSIS — Z0289 Encounter for other administrative examinations: Secondary | ICD-10-CM

## 2016-07-09 DIAGNOSIS — G35 Multiple sclerosis: Secondary | ICD-10-CM

## 2016-07-10 ENCOUNTER — Encounter: Payer: Self-pay | Admitting: Neurology

## 2016-07-10 LAB — CBC WITH DIFFERENTIAL/PLATELET
BASOS ABS: 0.1 10*3/uL (ref 0.0–0.2)
BASOS: 1 %
EOS (ABSOLUTE): 0.3 10*3/uL (ref 0.0–0.4)
Eos: 3 %
HEMOGLOBIN: 12.9 g/dL (ref 11.1–15.9)
Hematocrit: 38.6 % (ref 34.0–46.6)
IMMATURE GRANS (ABS): 0 10*3/uL (ref 0.0–0.1)
Immature Granulocytes: 0 %
LYMPHS: 41 %
Lymphocytes Absolute: 3.2 10*3/uL — ABNORMAL HIGH (ref 0.7–3.1)
MCH: 30.6 pg (ref 26.6–33.0)
MCHC: 33.4 g/dL (ref 31.5–35.7)
MCV: 92 fL (ref 79–97)
MONOCYTES: 6 %
Monocytes Absolute: 0.5 10*3/uL (ref 0.1–0.9)
NEUTROS ABS: 3.8 10*3/uL (ref 1.4–7.0)
Neutrophils: 49 %
PLATELETS: 254 10*3/uL (ref 150–379)
RBC: 4.22 x10E6/uL (ref 3.77–5.28)
RDW: 13.3 % (ref 12.3–15.4)
WBC: 7.7 10*3/uL (ref 3.4–10.8)

## 2016-07-16 ENCOUNTER — Encounter: Payer: Self-pay | Admitting: *Deleted

## 2016-07-24 ENCOUNTER — Encounter: Payer: Self-pay | Admitting: Neurology

## 2016-07-24 ENCOUNTER — Ambulatory Visit
Admission: EM | Admit: 2016-07-24 | Discharge: 2016-07-24 | Disposition: A | Discharge status: Home / Self Care | Payer: BLUE CROSS/BLUE SHIELD | Attending: Family Medicine | Admitting: Family Medicine

## 2016-07-24 ENCOUNTER — Ambulatory Visit (INDEPENDENT_AMBULATORY_CARE_PROVIDER_SITE_OTHER): Payer: BLUE CROSS/BLUE SHIELD | Admitting: Neurology

## 2016-07-24 VITALS — BP 130/68 | HR 72 | Resp 16 | Ht 62.0 in | Wt 194.0 lb

## 2016-07-24 DIAGNOSIS — G35 Multiple sclerosis: Secondary | ICD-10-CM | POA: Diagnosis not present

## 2016-07-24 DIAGNOSIS — G4489 Other headache syndrome: Secondary | ICD-10-CM | POA: Insufficient documentation

## 2016-07-24 DIAGNOSIS — R350 Frequency of micturition: Secondary | ICD-10-CM | POA: Diagnosis not present

## 2016-07-24 DIAGNOSIS — R260 Ataxic gait: Secondary | ICD-10-CM

## 2016-07-24 DIAGNOSIS — R5383 Other fatigue: Secondary | ICD-10-CM

## 2016-07-24 DIAGNOSIS — H109 Unspecified conjunctivitis: Secondary | ICD-10-CM

## 2016-07-24 DIAGNOSIS — F418 Other specified anxiety disorders: Secondary | ICD-10-CM | POA: Diagnosis not present

## 2016-07-24 DIAGNOSIS — J029 Acute pharyngitis, unspecified: Secondary | ICD-10-CM

## 2016-07-24 LAB — RAPID STREP SCREEN (MED CTR MEBANE ONLY): STREPTOCOCCUS, GROUP A SCREEN (DIRECT): NEGATIVE

## 2016-07-24 MED ORDER — IMIPRAMINE HCL 25 MG PO TABS: ORAL_TABLET | ORAL | 11 refills | Status: DC

## 2016-07-24 MED ORDER — GENTAMICIN SULFATE 0.3 % OP SOLN: 2.0000 [drp] | Freq: Three times a day (TID) | OPHTHALMIC | 0 refills | Status: DC

## 2016-07-24 NOTE — ED Triage Notes (Signed)
Patient complains of eye drainage in the left eye that started yesterday. She has had a cough, sneezing and sore throat for about a week. Patient tired some Sudafed, Cold and Flu therapy OTC with no relief.

## 2016-07-24 NOTE — ED Provider Notes (Signed)
[facility]    CSN: [id number] Arrival date & time: 07/24/16  1257  First Provider Contact:  First MD Initiated Contact with Patient 07/24/16 1336        History   Chief Complaint Chief Complaint  Patient presents with  . Eye Drainage    HPI Tracey Lindsey is a [age] y.o. female.   HPI  Past Medical History:  Diagnosis Date  . MS (multiple sclerosis) [facility](H[facility]) April 2016    Patient Active Problem List   Diagnosis Date Noted  . Other headache syndrome 07/24/2016  . Missed period 05/18/2016  . Obesity 11/19/2015  . Urinary frequency 11/14/2015  . Preventative health care 11/14/2015  . Oral contraceptive prescribed 06/08/2015  . Verruca vulgaris 06/08/2015  . Depression with anxiety 05/26/2015  . Weakness of left arm 05/26/2015  . Vertigo 04/04/2015  . Numbness 04/04/2015  . Vitamin D deficiency 04/04/2015  . 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 Anxiety state 04/04/2015  . Insomnia 04/04/2015  . Other fatigue 04/04/2015  . MS (multiple sclerosis) (HCC) 03/04/2015    History reviewed. No pertinent surgical history.  OB History    Gravida Para Term Preterm AB Living   1             SAB TAB Ectopic Multiple Live Births                   Home Medications    Prior to Admission medications   Medication Sig Start Date End Date Taking? Authorizing Provider  cholecalciferol (VITAMIN D) 1000 units tablet Take 1,000 Units by mouth daily.   Yes Historical Provider, MD  drospirenone-ethinyl estradiol (YASMIN 28) 3-0.03 MG tablet Take 1 tablet by mouth daily. 06/13/16  Yes Kerman PasseyMelinda P Lada, MD  natalizumab (TYSABRI) 300 MG/15ML injection Inject into the vein.   Yes Historical Provider, MD  budesonide (RHINOCORT AQUA) 32 MCG/ACT nasal spray Place 2 sprays into both nostrils as needed.     Historical Provider, MD  fluconazole (DIFLUCAN) 150 MG tablet Take 1 tablet (150 mg total) by mouth once. 05/19/16   Kerman PasseyMelinda P Lada, MD  fluticasone (CUTIVATE) 0.05 % cream Apply 1  application topically daily as needed.    Historical Provider, MD  gentamicin (GARAMYCIN) 0.3 % ophthalmic solution Place 2 drops into the left eye 3 (three) times daily. 07/24/16   Hassan RowanEugene Kaycee Mcgaugh, MD  imipramine (TOFRANIL) 25 MG tablet Take one or two at bedtime 07/24/16   Asa Lenteichard A Sater, MD  loratadine (CLARITIN) 10 MG tablet Take 10 mg by mouth daily as needed.     Historical Provider, MD  LORazepam (ATIVAN) 0.5 MG tablet Take 1 tablet (0.5 mg total) by mouth 2 (two) times daily as needed for anxiety. 11/01/15   Asa Lenteichard A Sater, MD  MEGARED OMEGA-3 KRILL OIL PO Take 900 mg by mouth.    Historical Provider, MD  OVER THE COUNTER MEDICATION     Historical Provider, MD  TURMERIC PO Take by mouth.    Historical Provider, MD    Family History Family History  Problem Relation Age of Onset  . Diabetes Father   . Hypertension Father   . Stroke Father   . Cancer Mother     lung  . Hyperlipidemia Mother   . Lung disease Mother     Social History Social History  Substance Use Topics  . Smoking status: Never Smoker  . Smokeless tobacco: Never Used  . Alcohol use No     Allergies  Review of patient's allergies indicates no known allergies.   Review of Systems Review of Systems   Physical Exam Triage Vital Signs ED Triage Vitals  Enc Vitals Group     BP 07/24/16 1317 111/72     Pulse Rate 07/24/16 1317 72     Resp 07/24/16 1317 18     Temp 07/24/16 1317 98.7 F (37.1 C)     Temp Source 07/24/16 1317 Tympanic     SpO2 07/24/16 1317 99 %     Weight 07/24/16 1313 194 lb (88 kg)     Height 07/24/16 1313 5\' 2"  (1.575 m)     Head Circumference --      Peak Flow --      Pain Score 07/24/16 1315 5     Pain Loc --      Pain Edu? --      Excl. in GC? --    No data found.   Updated Vital Signs BP 111/72 (BP Location: Left Arm)   Pulse 72   Temp 98.7 F (37.1 C) (Tympanic)   Resp 18   Ht 5\' 2"  (1.575 m)   Wt 194 lb (88 kg)   LMP 07/03/2016   SpO2 99%   BMI 35.48 kg/m    Visual Acuity Right Eye Distance:   Left Eye Distance:   Bilateral Distance:    Right Eye Near:   Left Eye Near:    Bilateral Near:     Physical Exam   UC Treatments / Results  Labs (all labs ordered are listed, but only abnormal results are displayed) Labs Reviewed  RAPID STREP SCREEN (NOT AT [facility])  CULTURE, GROUP A STREP [facility])    EKG  EKG Interpretation None       Radiology No results found.  Procedures Procedures (including critical care time)  Medications Ordered in UC Medications - No data to display   Initial Impression / Assessment and Plan / UC Course  I have reviewed the triage vital signs and the nursing notes.  Pertinent labs & imaging results that were available during my care of the patient were reviewed by me and considered in my medical decision making (see chart for details).   Results for orders placed or performed during the hospital encounter of 07/24/16  Rapid strep screen  Result Value Ref Range   Streptococcus, Group A Screen (Direct) NEGATIVE NEGATIVE   Clinical Course    With the strep test being negative we'll just treat her for a left conjunctivitis by putting on gentamicin eyedrops 2 drops in the left eye 3 times a day. Work note for today and tomorrow given as well. Final Clinical Impressions(s) / UC Diagnoses   Final diagnoses:  Conjunctivitis of left eye  Pharyngitis    New Prescriptions Discharge Medication List as of 07/24/2016  2:16 PM    START taking these medications   Details  gentamicin (GARAMYCIN) 0.3 % ophthalmic solution Place 2 drops into the left eye 3 (three) times daily., Starting Tue 07/24/2016, Normal         [name], MD 07/24/16 [phone]

## 2016-07-24 NOTE — Progress Notes (Signed)
[facility]  PATIENT: Tracey Lindsey DOB: [birth year]  REFERRING DOCTOR OR PCP:  Luan PullingMelinda Peterson Corbin AdeLeda ([place]) SOURCE: Patient and ER records and PACS images  _________________________________   HISTORICAL  CHIEF COMPLAINT:  Chief Complaint  Patient presents with  . Multiple Sclerosis    Sts. she continues to tolerate Tysabri well.  JCV ab last checked 07-10-16 and was indeterminate at 0.20.  Sts. fatigue has been worse this summer.  She also c/o dull, sporadic h /a's 2-3 times per week. She has not been able to identify a trigger for h/a's/fim    HISTORY OF PRESENT ILLNESS:  Tracey Lindsey is a 25 year old woman recently diagnosed with MS.     She is on Tysabri and tolerates it well.  She is JCV Ab negative (0.20  on 07/10/16)   She denies any new exacerbation.  She is having more headaches.  Headaches:    She is getting frequent headaches (2-3 days a week) that can be on either side of her head.   She sometimes has nausea.   No photo or phonophobia.   Moving head slightly increases the pain.   She notes pain in the back of the head/upper neck as well.   She does not take anything for the headahce  Gait/strength/sensation:   She feels gait is back to normal now.   She no longer has weakness and legft leg no longer stiff or weak.        She denies any numbness.     Bladder/bowel:  She has some urinary frequency.   She has 1 -2 times nocturia.   No incontinence.  No recent UTI's.   No hesitancy.   Bowel is fine  Vision/ears:  Diplopia has resolved and she has none, even when tired.     She has good VA and no difficulty with colors.  Fatigue/sleep:   She has mild fatigue (worse with heat) but is able to work 40 hours and go to school.    She is better than last year.    and is doing better than a couple months ago.      She is sleeping well.  Mood/Cognition:   She notes some anxiety and feels mood is doing very well.   She denies any significant cognitive  change.   MS History:   In retrospect, she had an episode of vertigo in 2014 with mild vision changes that improved.    In March 2016, she had vertigo that improved after a couple weeks.  In late April, she had  numbness in the left foot. When she woke up the next morning, she noted that the numbness was higher up the left arm and the next day it also involved the left hand and she went to the emergency room. In the emergency room, she had an MRI of the brain that showed white matter foci were some for multiple sclerosis.   MRI of the brain with and without contrast 03/29/15 showed several posterior fossa lesions with one in the right middle cerebellar peduncle and several in the pons.   The middle cerebellar peduncle focus and 2 of the pontine foci enhanced. There are also several periventricular and deep white matter foci in the cerebral hemispheres.   MRI cervical spine shows one enhancing focus.       REVIEW OF SYSTEMS: Constitutional: No fevers, chills, sweats, or change in appetite Eyes: No visual changes, double vision, eye pain Ear, nose and throat: No  hearing loss, ear pain, nasal congestion, sore throat Cardiovascular: No chest pain, palpitations Respiratory: No shortness of breath at rest or with exertion.   No wheezes GastrointestinaI: No nausea, vomiting, diarrhea, abdominal pain, fecal incontinence Genitourinary: Notes frequency, nocturia (x2) Musculoskeletal: No neck pain, back pain Integumentary: No rash, pruritus, skin lesions Neurological: as above Psychiatric: as above  Endocrine: No palpitations, diaphoresis, change in appetite, change in weigh or increased thirst Hematologic/Lymphatic: No anemia, purpura, petechiae. Allergic/Immunologic: No itchy/runny eyes, nasal congestion, recent allergic reactions, rashes  ALLERGIES: No Known Allergies  HOME MEDICATIONS:  Current Outpatient Prescriptions:  .  budesonide (RHINOCORT AQUA) 32 MCG/ACT nasal spray, Place 2 sprays  into both nostrils as needed. , Disp: , Rfl:  .  cholecalciferol (VITAMIN D) 1000 units tablet, Take 1,000 Units by mouth daily., Disp: , Rfl:  .  drospirenone-ethinyl estradiol (YASMIN 28) 3-0.03 MG tablet, Take 1 tablet by mouth daily., Disp: 1 Package, Rfl: 11 .  fluconazole (DIFLUCAN) 150 MG tablet, Take 1 tablet (150 mg total) by mouth once., Disp: 1 tablet, Rfl: 0 .  fluticasone (CUTIVATE) 0.05 % cream, Apply 1 application topically daily as needed., Disp: , Rfl:  .  loratadine (CLARITIN) 10 MG tablet, Take 10 mg by mouth daily as needed. , Disp: , Rfl:  .  LORazepam (ATIVAN) 0.5 MG tablet, Take 1 tablet (0.5 mg total) by mouth 2 (two) times daily as needed for anxiety., Disp: 15 tablet, Rfl: 0 .  MEGARED OMEGA-3 KRILL OIL PO, Take 900 mg by mouth., Disp: , Rfl:  .  natalizumab (TYSABRI) 300 MG/15ML injection, Inject into the vein., Disp: , Rfl:  .  OVER THE COUNTER MEDICATION, , Disp: , Rfl:  .  TURMERIC PO, Take by mouth., Disp: , Rfl:   PAST MEDICAL HISTORY: Past Medical History:  Diagnosis Date  . MS (multiple sclerosis) (HCC) April 2016    PAST SURGICAL HISTORY: No past surgical history on file.  FAMILY HISTORY: Family History  Problem Relation Age of Onset  . Diabetes Father   . Hypertension Father   . Stroke Father   . Cancer Mother     lung  . Hyperlipidemia Mother   . Lung disease Mother     SOCIAL HISTORY:  Social History   Social History  . Marital status: Single    Spouse name: N/A  . Number of children: N/A  . Years of education: N/A   Occupational History  . unemployeed     Social History Main Topics  . Smoking status: Never Smoker  . Smokeless tobacco: Never Used  . Alcohol use No  . Drug use: No  . Sexual activity: Yes    Birth control/ protection: Pill   Other Topics Concern  . Not on file   Social History Narrative   Lives with mother    Drinks soda daily (8-10 ounces)     PHYSICAL EXAM  Vitals:   07/24/16 1105  BP: 130/68   Pulse: 72  Resp: 16  Weight: 194 lb (88 kg)  Height: 5\' 2"  (1.575 m)    Body mass index is 35.48 kg/m.   General: The patient is well-developed and well-nourished and in no acute distress  Skin: Extremities are without significant edema.  Musculoskeletal:  Back is nontender  Neurologic Exam  Mental status: The patient is alert and oriented x 3 at the time of the examination. The patient has apparent normal recent and remote memory, with an apparently normal attention span and concentration ability.  Speech is normal.  Cranial nerves: Extraocular movements are full.    Facial symmetry is present. There is good facial sensation to soft touch bilaterally.Facial strength is normal.  Trapezius and sternocleidomastoid strength is normal. No dysarthria is noted.  The tongue is midline, and the patient has symmetric elevation of the soft palate. No obvious hearing deficits are noted.  Motor:  Muscle bulk is normal.   Tone is slightly increased on left arm. Strength is  5 / 5 in right arm and legs but 4+/5 in left arm with decreased arm rolling and drift also observed.   .   Sensory: Sensory testing shows reduced vibratory sensation in the left arm and leg. Temperature and touch sensation were symmetric today..  Coordination: Cerebellar testing reveals good finger-nose-finger on the right but reduced on left.     Gait and station: Station is normal.   Gait is normal. Tandem gait is mildly wide. Romberg is positive.   Reflexes: Deep tendon reflexes are symmetric and increased bilaterally with spread at the knees and 2 beats non-sustained clonus at ankles.    Marland Kitchen    DIAGNOSTIC DATA (LABS, IMAGING, TESTING) - I reviewed patient records, labs, notes, testing and imaging myself where available.  Lab Results  Component Value Date   WBC 7.7 07/09/2016   HGB 13.4 03/31/2015   HCT 38.6 07/09/2016   MCV 92 07/09/2016   PLT 254 07/09/2016      Component Value Date/Time   NA 138  11/14/2015 1417   K 3.8 11/14/2015 1417   CL 101 11/14/2015 1417   CO2 24 11/14/2015 1417   GLUCOSE 73 11/14/2015 1417   GLUCOSE 94 03/31/2015 1015   BUN 9 11/14/2015 1417   CREATININE 0.63 11/14/2015 1417   CALCIUM 9.3 11/14/2015 1417   PROT 6.7 11/14/2015 1417   ALBUMIN 4.6 11/14/2015 1417   AST 17 11/14/2015 1417   ALT 13 11/14/2015 1417   ALKPHOS 48 11/14/2015 1417   BILITOT 0.5 11/14/2015 1417   GFRNONAA 126 11/14/2015 1417   GFRAA 145 11/14/2015 1417   No results found.    ASSESSMENT AND PLAN  MS (multiple sclerosis) (HCC) - Plan: MR Brain W Wo Contrast, MR Cervical Spine W Wo Contrast, Urinalysis, Routine w reflex microscopic (not at [facility])  Ataxic gait  Other fatigue  Depression with anxiety  Urinary frequency - Plan: MR Cervical Spine W Wo Contrast, Urinalysis, Routine w reflex microscopic (not at [facility])  Other headache syndrome - Plan: MR Brain W Wo Contrast    1.   Continue Tysabri every 4 weeks. She is JCV Ab negative.  I will check an MRI of the brain and cervical spine to assess for the possibility of subclinical progression. If present, we will need to consider a different disease modifying therapy for her aggressive multiple sclerosis. 2.   Imipramine for headache nightly.   This may also help her insomnia. 3.  Stay active and exercise as tolerated.   She will return to see me in 4 months for regular visits and call sooner if she has new or worsening symptoms. She will continue to come in monthly for the IV infusions.  Richard A. Epimenio Foot, MD, PhD 07/24/2016, 11:34 AM Certified in Neurology, Clinical Neurophysiology, Sleep Medicine, Pain Medicine and Neuroimaging  [facility] [address], [address] [place], [place] [address] [phone]

## 2016-07-25 ENCOUNTER — Telehealth: Payer: Self-pay | Admitting: *Deleted

## 2016-07-25 LAB — URINALYSIS, ROUTINE W REFLEX MICROSCOPIC
Bilirubin, UA: NEGATIVE
Glucose, UA: NEGATIVE
KETONES UA: NEGATIVE
NITRITE UA: NEGATIVE
Protein, UA: NEGATIVE
RBC, UA: NEGATIVE
Specific Gravity, UA: 1.012 (ref 1.005–1.030)
Urobilinogen, Ur: 0.2 mg/dL (ref 0.2–1.0)
pH, UA: 7 (ref 5.0–7.5)

## 2016-07-25 LAB — MICROSCOPIC EXAMINATION: Casts: NONE SEEN /lpf

## 2016-07-25 MED ORDER — SULFAMETHOXAZOLE-TRIMETHOPRIM 800-160 MG PO TABS: 1.0000 | ORAL_TABLET | Freq: Two times a day (BID) | ORAL | 0 refills | Status: DC

## 2016-07-25 NOTE — Telephone Encounter (Signed)
I have spoken with Tracey Lindsey this morning and per RAS, advised that u/a showed bacteria, wbc's, consistent with uti; he would like for her to take Bactrim DS, one po bid for 7 days.  She is agreeable.  Rx. escribed to [facility] in [place] per her request/fim

## 2016-07-25 NOTE — Telephone Encounter (Signed)
-----   Message from Asa Lenteichard A Sater, MD sent at 07/25/2016  9:04 AM EDT ----- Urine has bacteria and WBC --- please let her know and call in Bactrim DS  #14  Bid x 7days

## 2016-07-25 NOTE — Telephone Encounter (Signed)
-----   Message from Richard A Sater, MD sent at 07/25/2016  9:04 AM EDT ----- Urine has bacteria and WBC --- please let her know and call in Bactrim DS  #14  Bid x 7days 

## 2016-07-26 LAB — CULTURE, GROUP A STREP (THRC)

## 2016-07-31 ENCOUNTER — Telehealth: Payer: Self-pay | Admitting: Neurology

## 2016-07-31 ENCOUNTER — Telehealth: Payer: Self-pay | Admitting: Family Medicine

## 2016-07-31 MED ORDER — FLUCONAZOLE 150 MG PO TABS: 150.0000 mg | ORAL_TABLET | Freq: Once | ORAL | 0 refills | Status: AC

## 2016-07-31 NOTE — Telephone Encounter (Signed)
LMTC.  She saw RAS  on 07-24-16./fim

## 2016-07-31 NOTE — Telephone Encounter (Signed)
Patient called, states she feels extremely fatigued this week, asks if there is anything she can do for this? Or is there anything she should avoid. Please call to advise.

## 2016-07-31 NOTE — Telephone Encounter (Signed)
Rx sent 

## 2016-08-01 ENCOUNTER — Other Ambulatory Visit: Payer: Self-pay | Admitting: *Deleted

## 2016-08-01 DIAGNOSIS — N390 Urinary tract infection, site not specified: Secondary | ICD-10-CM

## 2016-08-01 DIAGNOSIS — R350 Frequency of micturition: Secondary | ICD-10-CM

## 2016-08-01 NOTE — Telephone Encounter (Signed)
Patient is returning your call. She can be reached today before 9:30am and after 3:30pm @ [phone].

## 2016-08-01 NOTE — Telephone Encounter (Signed)
I have spoken with Selena Batten today.  She c/o increased fatigue this week, worse than normal.  Sts. it is fatigue similar to fatigue associated with the flu.  Hx. of recent uti.  Sts. she still has some urinary frequency.  Just finished Septra yesterday.  Denies all other sx., including fever.  Per RAS, ok to repeat u/a, with c&s this time, to r/o continued uti.  Also ok for Amantadine 100mg  po bid for fatigue.  Kim verbalized understanding of same and will come in today for u/a.  She declined Amantadine at this time; will let me know if she changes her mind/fim

## 2016-10-10 ENCOUNTER — Ambulatory Visit (INDEPENDENT_AMBULATORY_CARE_PROVIDER_SITE_OTHER): Payer: BLUE CROSS/BLUE SHIELD

## 2016-10-10 DIAGNOSIS — G35 Multiple sclerosis: Secondary | ICD-10-CM | POA: Diagnosis not present

## 2016-10-10 DIAGNOSIS — R350 Frequency of micturition: Secondary | ICD-10-CM | POA: Diagnosis not present

## 2016-10-10 DIAGNOSIS — G4489 Other headache syndrome: Secondary | ICD-10-CM

## 2016-10-11 MED ORDER — GADOPENTETATE DIMEGLUMINE 469.01 MG/ML IV SOLN: 18.0000 mL | Freq: Once | INTRAVENOUS | Status: DC | PRN

## 2016-10-15 ENCOUNTER — Telehealth: Payer: Self-pay | Admitting: *Deleted

## 2016-10-15 NOTE — Telephone Encounter (Signed)
I have spoken with Tracey Lindsey this morning and per RAS, advised that MRI's of the brain, c-spine showed no new lesions, and a couple of the lesions in her brain are actually smaller than they were on her previous MRI.  She verbalized understanding of same/fim

## 2016-10-15 NOTE — Telephone Encounter (Signed)
-----   Message from Asa Lente, MD sent at 10/12/2016 12:54 PM EST ----- Please let her know that the MRI of the brain and cervical spine did not show any new lesions. The MRI of the brain actually looks better with a couple of the lesions being smaller

## 2016-10-22 ENCOUNTER — Telehealth: Payer: Self-pay | Admitting: Neurology

## 2016-10-22 MED ORDER — SULFAMETHOXAZOLE-TRIMETHOPRIM 800-160 MG PO TABS: 1.0000 | ORAL_TABLET | Freq: Two times a day (BID) | ORAL | 0 refills | Status: DC

## 2016-10-22 NOTE — Telephone Encounter (Signed)
I have spoken with Tracey BattenKim this morning.  She c/o onset yesterday of urinary frequency, dysuria.  Sts. she is not able to come in for u/a today.  Last time she was treated for a uti was in August 2017.  [facility]  If sx. persist or worsen she will call back/fim

## 2016-10-22 NOTE — Telephone Encounter (Signed)
Pt called, thinks she has UTI. Please call

## 2016-10-24 ENCOUNTER — Telehealth: Payer: Self-pay | Admitting: Family Medicine

## 2016-10-24 MED ORDER — FLUCONAZOLE 150 MG PO TABS: 150.0000 mg | ORAL_TABLET | Freq: Once | ORAL | 0 refills | Status: AC

## 2016-10-24 NOTE — Telephone Encounter (Signed)
Pt states she is taking a antibiotic and she now has a yeast infection and is asking for [facility] to [facility] [address]

## 2016-10-24 NOTE — Telephone Encounter (Signed)
Please verify when her LMP was and update I don't think the pregnancy status bar is correct If she's not pregnant, then she can pick up the Rx at [facility] If she IS pregnant, then back to me please Happy Thanksgiving

## 2016-10-29 NOTE — Telephone Encounter (Signed)
Looks like Charity fundraiser from [employer] sent rx for diflucan already

## 2016-10-30 ENCOUNTER — Encounter (INDEPENDENT_AMBULATORY_CARE_PROVIDER_SITE_OTHER): Payer: Self-pay

## 2016-10-30 ENCOUNTER — Ambulatory Visit: Payer: BLUE CROSS/BLUE SHIELD | Admitting: Family Medicine

## 2016-11-05 ENCOUNTER — Other Ambulatory Visit: Payer: Self-pay | Admitting: Family Medicine

## 2016-11-05 ENCOUNTER — Ambulatory Visit (INDEPENDENT_AMBULATORY_CARE_PROVIDER_SITE_OTHER): Payer: BLUE CROSS/BLUE SHIELD | Admitting: Family Medicine

## 2016-11-05 ENCOUNTER — Encounter: Payer: Self-pay | Admitting: Family Medicine

## 2016-11-05 VITALS — BP 110/64 | HR 85 | Temp 99.4°F | Resp 16 | Ht 62.0 in | Wt 195.3 lb

## 2016-11-05 DIAGNOSIS — N3000 Acute cystitis without hematuria: Secondary | ICD-10-CM

## 2016-11-05 DIAGNOSIS — N898 Other specified noninflammatory disorders of vagina: Secondary | ICD-10-CM

## 2016-11-05 DIAGNOSIS — Z124 Encounter for screening for malignant neoplasm of cervix: Secondary | ICD-10-CM

## 2016-11-05 NOTE — Patient Instructions (Signed)
We'll contact you about the labs

## 2016-11-05 NOTE — Assessment & Plan Note (Signed)
Wet mount

## 2016-11-05 NOTE — Progress Notes (Signed)
BP 110/64 (BP Location: Left Arm, Patient Position: Sitting, Cuff Size: Normal)   Pulse 85   Temp 99.4 F (37.4 C) (Oral)   Resp 16   Ht 5\' 2"  (1.575 m)   Wt 195 lb 5 oz (88.6 kg)   LMP 10/29/2016   SpO2 92%   Breastfeeding? Unknown   BMI 35.72 kg/m    Subjective:    Patient ID: Tracey Lindsey, female    DOB: 02-02-1991, 25 y.o.   MRN: [id number]  HPI: Tracey Lindsey is a 25 y.o. female  Chief Complaint  Patient presents with  . Follow-up    UTI F/U and Yeast infection F/U- no discharge    Had a urinary tract infection; treated by neurologist; no burning with urination; no fever; no odor to the urine No urine collected, just reated based on symptoms She was treated with Bactrim, then got a yeast infection Urine specimen collected Her last period was normal Left side (labia) is a little swollen; no vaginal discharge; no blisters She is not having any urinary tract symptoms but would like her urine checked today Her last pap smear was Nov 2014; she had an abnormal pap smear remotely, next recheck was normal; thinks she has had two normals; no treatment  Depression screen [facility] 2/9 11/05/2016 05/18/2016  Decreased Interest 0 0  Down, Depressed, Hopeless 0 0  PHQ - 2 Score 0 0   Relevant past medical, surgical, family and social history reviewed Past Medical History:  Diagnosis Date  . MS (multiple sclerosis) [facility]) April 2016   History reviewed. No pertinent surgical history. Family History  Problem Relation Age of Onset  . Diabetes Father   . Hypertension Father   . Stroke Father   . Cancer Mother     lung  . Hyperlipidemia Mother   . Lung disease Mother    Social History  Substance Use Topics  . Smoking status: Never Smoker  . Smokeless tobacco: Never Used  . Alcohol use No   Interim medical history since last visit reviewed. Allergies and medications reviewed  Review of Systems Per HPI unless specifically indicated above     Objective:    BP 110/64  (BP Location: Left Arm, Patient Position: Sitting, Cuff Size: Normal)   Pulse 85   Temp 99.4 F (37.4 C) (Oral)   Resp 16   Ht 5\' 2"  (1.575 m)   Wt 195 lb 5 oz (88.6 kg)   LMP 10/29/2016   SpO2 92%   Breastfeeding? Unknown   BMI 35.72 kg/m   Wt Readings from Last 3 Encounters:  11/05/16 195 lb 5 oz (88.6 kg)  07/24/16 194 lb (88 kg)  07/24/16 194 lb (88 kg)    Physical Exam  Constitutional: She appears well-developed and well-nourished. No distress.  Cardiovascular: Normal rate.   Pulmonary/Chest: Effort normal.  Abdominal: Soft. She exhibits no distension. There is no tenderness.  Genitourinary: There is no rash or lesion on the right labia. There is no rash or lesion on the left labia. Uterus is not deviated, not enlarged, not fixed and not tender. Cervix exhibits no motion tenderness, no discharge and no friability. Right adnexum displays no mass, no tenderness and no fullness. Left adnexum displays no mass, no tenderness and no fullness. No erythema, tenderness or bleeding in the vagina. No signs of injury around the vagina. Vaginal discharge (scant whitish, no clumps; no fishy odor) found.  Psychiatric: Her mood appears not anxious. She does not exhibit a depressed  mood.   Results for orders placed or performed during the hospital encounter of 07/24/16  Rapid strep screen  Result Value Ref Range   Streptococcus, Group A Screen (Direct) NEGATIVE NEGATIVE  Culture, group A strep  Result Value Ref Range   Specimen Description THROAT    Special Requests NONE Reflexed from [id number] [id number]    Culture      NO GROUP A STREP (S.PYOGENES) ISOLATED Performed at [facility]    Report Status 07/26/2016 FINAL       Assessment & Plan:   Problem List Items Addressed This Visit      Other   Vaginal discharge - Primary    Wet mount      Relevant Orders   WET PREP BY MOLECULAR PROBE    Other Visit Diagnoses    Cervical cancer screening       thin prep collected   Relevant  Orders   Pap IG and HPV (high risk) DNA detection   Acute cystitis without hematuria       recent bladder infection; treated with Bactrim; patient wishes urine to be checked   Relevant Orders   Urinalysis w microscopic + reflex cultur      Follow up plan: No Follow-up on file.  An after-visit summary was printed and given to the patient at check-out.  Please see the patient instructions which may contain other information and recommendations beyond what is mentioned above in the assessment and plan.  No orders of the defined types were placed in this encounter.   Orders Placed This Encounter  Procedures  . WET PREP BY MOLECULAR PROBE  . Urinalysis w microscopic + reflex cultur

## 2016-11-06 LAB — URINALYSIS W MICROSCOPIC + REFLEX CULTURE
BILIRUBIN URINE: NEGATIVE
CASTS: NONE SEEN [LPF]
CRYSTALS: NONE SEEN [HPF]
Glucose, UA: NEGATIVE
Hgb urine dipstick: NEGATIVE
Leukocytes, UA: NEGATIVE
Nitrite: NEGATIVE
PROTEIN: NEGATIVE
Specific Gravity, Urine: 1.029 (ref 1.001–1.035)
Yeast: NONE SEEN [HPF]
pH: 6 (ref 5.0–8.0)

## 2016-11-06 LAB — WET PREP BY MOLECULAR PROBE
CANDIDA SPECIES: NEGATIVE
GARDNERELLA VAGINALIS: NEGATIVE
Trichomonas vaginosis: NEGATIVE

## 2016-11-07 LAB — PAP IG AND HPV HIGH-RISK: HPV DNA HIGH RISK: NOT DETECTED

## 2016-12-18 ENCOUNTER — Telehealth: Payer: Self-pay | Admitting: Neurology

## 2016-12-18 MED ORDER — SULFAMETHOXAZOLE-TRIMETHOPRIM 800-160 MG PO TABS: 1.0000 | ORAL_TABLET | Freq: Two times a day (BID) | ORAL | 0 refills | Status: DC

## 2016-12-18 NOTE — Telephone Encounter (Signed)
Spoke with Selena Batten this morning.  She reports urinary frequency onset last night.  Recent UTI in November, treated with Septra with repeat u/a by pcp in early December showing resolution.  Kim sts. pcp is not in the office today, sts. she is at work, unable to leave work to come in and submit u/a.  Per RAS, ok for Septra and pt. to call in 1-2 days if not feeling better, would need u/a, cx and sens. for future urinary sx, to ensure any infection is treated with the appropriate antibiotic.  Pt. is agreeable.  Rx. escribed to [facility] per her request/fim

## 2016-12-18 NOTE — Telephone Encounter (Signed)
Patient is calling in reference to a possible UTI, patient did reach out to PCP but physician is out of town.  Please call

## 2016-12-18 NOTE — Addendum Note (Signed)
Addended by: Candis Schatz I on: 12/18/2016 10:31 AM   Modules accepted: Orders

## 2017-01-11 ENCOUNTER — Encounter: Payer: Self-pay | Admitting: Family Medicine

## 2017-01-11 ENCOUNTER — Ambulatory Visit (INDEPENDENT_AMBULATORY_CARE_PROVIDER_SITE_OTHER): Payer: BLUE CROSS/BLUE SHIELD | Admitting: Family Medicine

## 2017-01-11 VITALS — BP 104/62 | HR 78 | Temp 99.1°F | Resp 14 | Wt 197.2 lb

## 2017-01-11 DIAGNOSIS — R1030 Lower abdominal pain, unspecified: Secondary | ICD-10-CM | POA: Diagnosis not present

## 2017-01-11 DIAGNOSIS — M545 Low back pain, unspecified: Secondary | ICD-10-CM

## 2017-01-11 LAB — POCT URINALYSIS DIPSTICK
BILIRUBIN UA: NEGATIVE
Blood, UA: NEGATIVE
GLUCOSE UA: NEGATIVE
KETONES UA: NEGATIVE
LEUKOCYTES UA: NEGATIVE
Nitrite, UA: NEGATIVE
PROTEIN UA: NEGATIVE
Spec Grav, UA: 1.015
Urobilinogen, UA: 0.2
pH, UA: 5.5

## 2017-01-11 NOTE — Patient Instructions (Addendum)
Back Exercises Introduction If you have pain in your back, do these exercises 2-3 times each day or as told by your doctor. When the pain goes away, do the exercises once each day, but repeat the steps more times for each exercise (do more repetitions). If you do not have pain in your back, do these exercises once each day or as told by your doctor. Exercises Single Knee to Chest  Do these steps 3-5 times in a row for each leg: 1. Lie on your back on a firm bed or the floor with your legs stretched out. 2. Bring one knee to your chest. 3. Hold your knee to your chest by grabbing your knee or thigh. 4. Pull on your knee until you feel a gentle stretch in your lower back. 5. Keep doing the stretch for 10-30 seconds. 6. Slowly let go of your leg and straighten it. Pelvic Tilt  Do these steps 5-10 times in a row: 1. Lie on your back on a firm bed or the floor with your legs stretched out. 2. Bend your knees so they point up to the ceiling. Your feet should be flat on the floor. 3. Tighten your lower belly (abdomen) muscles to press your lower back against the floor. This will make your tailbone point up to the ceiling instead of pointing down to your feet or the floor. 4. Stay in this position for 5-10 seconds while you gently tighten your muscles and breathe evenly. Cat-Cow  Do these steps until your lower back bends more easily: 1. Get on your hands and knees on a firm surface. Keep your hands under your shoulders, and keep your knees under your hips. You may put padding under your knees. 2. Let your head hang down, and make your tailbone point down to the floor so your lower back is round like the back of a cat. 3. Stay in this position for 5 seconds. 4. Slowly lift your head and make your tailbone point up to the ceiling so your back hangs low (sags) like the back of a cow. 5. Stay in this position for 5 seconds. Press-Ups  Do these steps 5-10 times in a row: 1. Lie on your belly  (face-down) on the floor. 2. Place your hands near your head, about shoulder-width apart. 3. While you keep your back relaxed and keep your hips on the floor, slowly straighten your arms to raise the top half of your body and lift your shoulders. Do not use your back muscles. To make yourself more comfortable, you may change where you place your hands. 4. Stay in this position for 5 seconds. 5. Slowly return to lying flat on the floor. Bridges  Do these steps 10 times in a row: 1. Lie on your back on a firm surface. 2. Bend your knees so they point up to the ceiling. Your feet should be flat on the floor. 3. Tighten your butt muscles and lift your butt off of the floor until your waist is almost as high as your knees. If you do not feel the muscles working in your butt and the back of your thighs, slide your feet 1-2 inches farther away from your butt. 4. Stay in this position for 3-5 seconds. 5. Slowly lower your butt to the floor, and let your butt muscles relax. If this exercise is too easy, try doing it with your arms crossed over your chest. Belly Crunches  Do these steps 5-10 times in a row: 1. Lie   on your back on a firm bed or the floor with your legs stretched out. 2. Bend your knees so they point up to the ceiling. Your feet should be flat on the floor. 3. Cross your arms over your chest. 4. Tip your chin a little bit toward your chest but do not bend your neck. 5. Tighten your belly muscles and slowly raise your chest just enough to lift your shoulder blades a tiny bit off of the floor. 6. Slowly lower your chest and your head to the floor. Back Lifts  Do these steps 5-10 times in a row: 1. Lie on your belly (face-down) with your arms at your sides, and rest your forehead on the floor. 2. Tighten the muscles in your legs and your butt. 3. Slowly lift your chest off of the floor while you keep your hips on the floor. Keep the back of your head in line with the curve in your back.  Look at the floor while you do this. 4. Stay in this position for 3-5 seconds. 5. Slowly lower your chest and your face to the floor. Contact a doctor if:  Your back pain gets a lot worse when you do an exercise.  Your back pain does not lessen 2 hours after you exercise. If you have any of these problems, stop doing the exercises. Do not do them again unless your doctor says it is okay. Get help right away if:  You have sudden, very bad back pain. If this happens, stop doing the exercises. Do not do them again unless your doctor says it is okay. This information is not intended to replace advice given to you by your health care provider. Make sure you discuss any questions you have with your health care provider. Document Released: 12/22/2010 Document Revised: 04/26/2016 Document Reviewed: 01/13/2015  2017 [employer]  

## 2017-01-11 NOTE — Progress Notes (Signed)
BP 104/62   Pulse 78   Temp 99.1 F (37.3 C) (Oral)   Resp 14   Wt 197 lb 3.2 oz (89.4 kg)   LMP 12/25/2016   SpO2 98%   BMI 36.07 kg/m    Subjective:    Patient ID: Tracey Lindsey, female    DOB: [birth year], 26 y.o.   MRN: [id number]  HPI: Tracey Lindsey is a 26 y.o. female  Chief Complaint  Patient presents with  . Back Pain  . Abdominal Pain   Lower back pain, not constant Does not change with movement; mid to lower back, across bilateral No real muscle cramps Constant dull pain; not sharp She has not really tried anything No burning with urination; no pressure over bladder No blood in the urine No strong smell urine No worry for STDs No fevers No radiation of pain going down the legs Pain is nagging, nothing that keeps her from doing activities  Urine today was normal Pain does not seem to correlated to cycle   Depression screen [facility] 2/9 01/11/2017 11/05/2016 05/18/2016  Decreased Interest 0 0 0  Down, Depressed, Hopeless 0 0 0  PHQ - 2 Score 0 0 0   Relevant past medical, surgical, family and social history reviewed Past Medical History:  Diagnosis Date  . MS (multiple sclerosis) [facility]) April 2016   History reviewed. No pertinent surgical history. Family History  Problem Relation Age of Onset  . Diabetes Father   . Hypertension Father   . Stroke Father   . Cancer Mother     lung  . Hyperlipidemia Mother   . Lung disease Mother    Social History  Substance Use Topics  . Smoking status: Never Smoker  . Smokeless tobacco: Never Used  . Alcohol use No   Interim medical history since last visit reviewed. Allergies and medications reviewed  Review of Systems Per HPI unless specifically indicated above     Objective:    BP 104/62   Pulse 78   Temp 99.1 F (37.3 C) (Oral)   Resp 14   Wt 197 lb 3.2 oz (89.4 kg)   LMP 12/25/2016   SpO2 98%   BMI 36.07 kg/m   Wt Readings from Last 3 Encounters:  01/11/17 197 lb 3.2 oz (89.4 kg)  11/05/16  195 lb 5 oz (88.6 kg)  07/24/16 194 lb (88 kg)    Physical Exam  Constitutional: She appears well-developed and well-nourished.  HENT:  Mouth/Throat: Mucous membranes are normal.  Eyes: EOM are normal. No scleral icterus.  Cardiovascular: Normal rate and regular rhythm.   Pulmonary/Chest: Effort normal and breath sounds normal.  Abdominal: Soft. Bowel sounds are normal. She exhibits no distension. There is no tenderness. There is no CVA tenderness.  Musculoskeletal:       Thoracic back: She exhibits normal range of motion, no tenderness, no swelling and no deformity.       Lumbar back: She exhibits normal range of motion, no tenderness, no swelling and no deformity.  Skin: She is not diaphoretic.  Psychiatric: She has a normal mood and affect. Her behavior is normal.   Results for orders placed or performed in visit on 01/11/17  POCT urinalysis dipstick  Result Value Ref Range   Color, UA dark yellow    Clarity, UA clear    Glucose, UA neg    Bilirubin, UA neg    Ketones, UA neg    Spec Grav, UA 1.015    Blood, UA  neg    pH, UA 5.5    Protein, UA neg    Urobilinogen, UA 0.2    Nitrite, UA neg    Leukocytes, UA Negative Negative      Assessment & Plan:   Problem List Items Addressed This Visit    None    Visit Diagnoses    Low back pain without sciatica, unspecified back pain laterality, unspecified chronicity    -  Primary   likely musculoskeletal; will have her try exercises; conservative treatment; call if not resolving   Relevant Orders   POCT urinalysis dipstick (Completed)   Lower abdominal pain       urine checked today; no red flags; notify me if not resolving quickly, or if symptoms worsen   Relevant Orders   POCT urinalysis dipstick (Completed)       Follow up plan: No Follow-up on file.  An after-visit summary was printed and given to the patient at check-out.  Please see the patient instructions which may contain other information and recommendations  beyond what is mentioned above in the assessment and plan.  No orders of the defined types were placed in this encounter.   Orders Placed This Encounter  Procedures  . POCT urinalysis dipstick

## 2017-01-24 ENCOUNTER — Ambulatory Visit (INDEPENDENT_AMBULATORY_CARE_PROVIDER_SITE_OTHER): Payer: BLUE CROSS/BLUE SHIELD | Admitting: Neurology

## 2017-01-24 ENCOUNTER — Encounter: Payer: Self-pay | Admitting: Neurology

## 2017-01-24 VITALS — BP 100/60 | HR 68 | Resp 16 | Ht 62.0 in | Wt 197.0 lb

## 2017-01-24 DIAGNOSIS — Z79899 Other long term (current) drug therapy: Secondary | ICD-10-CM | POA: Diagnosis not present

## 2017-01-24 DIAGNOSIS — F411 Generalized anxiety disorder: Secondary | ICD-10-CM

## 2017-01-24 DIAGNOSIS — G4700 Insomnia, unspecified: Secondary | ICD-10-CM | POA: Diagnosis not present

## 2017-01-24 DIAGNOSIS — G35 Multiple sclerosis: Secondary | ICD-10-CM

## 2017-01-24 DIAGNOSIS — R350 Frequency of micturition: Secondary | ICD-10-CM

## 2017-01-24 DIAGNOSIS — R5383 Other fatigue: Secondary | ICD-10-CM

## 2017-01-24 NOTE — Patient Instructions (Signed)
Take imipramine 90 minutes before bedtime.

## 2017-01-24 NOTE — Progress Notes (Signed)
[facility]  PATIENT: Tracey Lindsey DOB: 03/24/1991  REFERRING DOCTOR OR PCP:  Luan Pulling Corbin Ade) SOURCE: Patient and ER records and PACS images  _________________________________   HISTORICAL  CHIEF COMPLAINT:  Chief Complaint  Patient presents with  . Multiple Sclerosis    Sts. she continues to tolerate Tysabri well.  JCV ab last checked 07-10-16 and was indeterminate at 0.20  Sts. Imipramine casued next day drowsiness, so she stopped it.  Sts. h/a's  have been almost daily for the last few weeks.  Sts.h/a's are more of a pressure feeling, not pain, and worse in the back of her eyes when she lies down.  Denies eye pain or visual disturbance/fim    HISTORY OF PRESENT ILLNESS:  Tracey Lindsey is a 26 year old woman recently diagnosed with MS.    She is having headaches almost every night.  MS:  She is on Tysabri and tolerates it well.  She is JCV Ab negative (0.20  on 07/10/16)   She denies any new exacerbation.   Cervical spine and brain November 2017 was personally reviewed. There were no new lesions. The previously enhancing lesions were smaller and no longer enhanced.  Headaches:    She is getting frequent headaches (6-7 days a week, >25/30 a month) that are usuallybilatreal when she lays down to go to sleep.   She gets a pressure sensation.  HA is usually milder the next morning.   It only seems to occur when she tries to go to sleep.  No N/V.    No photo or phonophobia.   Moving head does not alter the pain.   She notes pain in the back of the head/upper neck as well.   She does not take anything for the headache.   She took imipramine but it caused a headache.   She took it right at bedtime.    Gait/strength/sensation:   She feels her gait is doing very well, back to normal.   There is no weakness or spasticity in her legs..    She denies any numbness.     Bladder/bowel:  She has urinary frequency only at night.    She has 3-4 times nocturia many  nights.   No incontinence.  No recent UTI's.   No hesitancy.   Bowel is fine  Vision/ears:  Her diplopia resolved.       She has good VA and no difficulty with colors.  Fatigue/sleep:   She has mild fatigue but is able to work full time.    She is better than last year.    and is doing better than a couple months ago.      She is sleeping worse with frequent awakenings for nocturia.     Imipramine helped but caused a hangover.     Mood/Cognition:   She notes some anxiety but no depression.   She denies any significant cognitive change.   MS History:   In retrospect, she had an episode of vertigo in 2014 with mild vision changes that improved.    In March 2016, she had vertigo that improved after a couple weeks.  In late April, she had  numbness in the left foot. When she woke up the next morning, she noted that the numbness was higher up the left arm and the next day it also involved the left hand and she went to the emergency room. In the emergency room, she had an MRI of the brain that showed  white matter foci were some for multiple sclerosis.   MRI of the brain with and without contrast 03/29/15 showed several posterior fossa lesions with one in the right middle cerebellar peduncle and several in the pons.   The middle cerebellar peduncle focus and 2 of the pontine foci enhanced. There are also several periventricular and deep white matter foci in the cerebral hemispheres.   MRI cervical spine shows one enhancing focus.  Repeat imaging of the brain and cervical spine November 2017 did not show any new lesions. The previously enhancing lesions were smaller and did not enhance.Marland Kitchen       REVIEW OF SYSTEMS: Constitutional: No fevers, chills, sweats, or change in appetite Eyes: No visual changes, double vision, eye pain Ear, nose and throat: No hearing loss, ear pain, nasal congestion, sore throat Cardiovascular: No chest pain, palpitations Respiratory: No shortness of breath at rest or with exertion.    No wheezes GastrointestinaI: No nausea, vomiting, diarrhea, abdominal pain, fecal incontinence Genitourinary: Notes frequency, nocturia (x2) Musculoskeletal: No neck pain, back pain Integumentary: No rash, pruritus, skin lesions Neurological: as above Psychiatric: as above  Endocrine: No palpitations, diaphoresis, change in appetite, change in weigh or increased thirst Hematologic/Lymphatic: No anemia, purpura, petechiae. Allergic/Immunologic: No itchy/runny eyes, nasal congestion, recent allergic reactions, rashes  ALLERGIES: No Known Allergies  HOME MEDICATIONS:  Current Outpatient Prescriptions:  .  cholecalciferol (VITAMIN D) 1000 units tablet, Take 1,000 Units by mouth daily., Disp: , Rfl:  .  drospirenone-ethinyl estradiol (YASMIN 28) 3-0.03 MG tablet, Take 1 tablet by mouth daily., Disp: 1 Package, Rfl: 11 .  loratadine (CLARITIN) 10 MG tablet, Take 10 mg by mouth daily as needed. , Disp: , Rfl:  .  natalizumab (TYSABRI) 300 MG/15ML injection, Inject into the vein., Disp: , Rfl:  .  TURMERIC PO, Take by mouth., Disp: , Rfl:  No current facility-administered medications for this visit.   Facility-Administered Medications Ordered in Other Visits:  .  gadopentetate dimeglumine (MAGNEVIST) injection 18 mL, 18 mL, Intravenous, Once PRN, Asa Lente, MD  PAST MEDICAL HISTORY: Past Medical History:  Diagnosis Date  . MS (multiple sclerosis) ([facility]) April 2016    PAST SURGICAL HISTORY: No past surgical history on file.  FAMILY HISTORY: Family History  Problem Relation Age of Onset  . Diabetes Father   . Hypertension Father   . Stroke Father   . Cancer Mother     lung  . Hyperlipidemia Mother   . Lung disease Mother     SOCIAL HISTORY:  Social History   Social History  . Marital status: Single    Spouse name: N/A  . Number of children: N/A  . Years of education: N/A   Occupational History  . unemployeed     Social History Main Topics  . Smoking  status: Never Smoker  . Smokeless tobacco: Never Used  . Alcohol use No  . Drug use: No  . Sexual activity: Yes    Birth control/ protection: Pill   Other Topics Concern  . Not on file   Social History Narrative   Lives with mother    Drinks soda daily (8-10 ounces)     PHYSICAL EXAM  Vitals:   01/24/17 0829  BP: 100/60  Pulse: 68  Resp: 16  Weight: 197 lb (89.4 kg)  Height: 5\' 2"  (1.575 m)    Body mass index is 36.03 kg/m.   General: The patient is well-developed and well-nourished and in no acute distress  Skin: Extremities are  without significant edema.  Musculoskeletal:  Back is nontender  Neurologic Exam  Mental status: The patient is alert and oriented x 3 at the time of the examination. The patient has apparent normal recent and remote memory, with an apparently normal attention span and concentration ability.   Speech is normal.  Cranial nerves: Extraocular movements are full.    Facial symmetry is present. There is good facial sensation to soft touch bilaterally.Facial strength is normal.  Trapezius and sternocleidomastoid strength is normal. No dysarthria is noted.  The tongue is midline, and the patient has symmetric elevation of the soft palate. No obvious hearing deficits are noted.  Motor:  Muscle bulk is normal.   Tone is slightly increased in left arm. Strength is  5 / 5 in right arm and legs but 4+/5 in left arm with decreased arm rolling and drift also observed.   .   Sensory:   She now has normal and symmetric sensation to touch and vibration in the arms and legs.  Coordination: Cerebellar testing reveals good finger-nose-finger on the right but reduced on left.     Gait and station: Station is normal.   Gait is normal. Tandem gait is very mildly wide. Romberg is negative now.   Reflexes: Deep tendon reflexes are symmetric and increased at the knees with spread. There was no ankle clonus. Reflexes were plus in the arms  .    DIAGNOSTIC DATA  (LABS, IMAGING, TESTING) - I reviewed patient records, labs, notes, testing and imaging myself where available.  Lab Results  Component Value Date   WBC 7.7 07/09/2016   HGB 13.4 03/31/2015   HCT 38.6 07/09/2016   MCV 92 07/09/2016   PLT 254 07/09/2016      Component Value Date/Time   NA 138 11/14/2015 1417   K 3.8 11/14/2015 1417   CL 101 11/14/2015 1417   CO2 24 11/14/2015 1417   GLUCOSE 73 11/14/2015 1417   GLUCOSE 94 03/31/2015 1015   BUN 9 11/14/2015 1417   CREATININE 0.63 11/14/2015 1417   CALCIUM 9.3 11/14/2015 1417   PROT 6.7 11/14/2015 1417   ALBUMIN 4.6 11/14/2015 1417   AST 17 11/14/2015 1417   ALT 13 11/14/2015 1417   ALKPHOS 48 11/14/2015 1417   BILITOT 0.5 11/14/2015 1417   GFRNONAA 126 11/14/2015 1417   GFRAA 145 11/14/2015 1417   No results found.    ASSESSMENT AND PLAN  MS (multiple sclerosis) (HCC) - Plan: Stratify JCV Antibody Test ([facility]), CBC with Differential/Platelet  Insomnia, unspecified type  Anxiety state  Other fatigue  Urinary frequency  High risk medication use   1.   She will continue Tysabri every 4 weeks.    We will recheck the JCV antibody status. She has been negative so far. We discussed that if she converts from negative to middle or high positive that I would recommend a switch in therapy.  2.   She will retry imipramine for urinary frequency, insomnia and headache nightly but take 90 minutes before bedtime. 3.  Stay active and exercise as tolerated.  4.    She will return to see me in 6 months for regular visits and call sooner if she has new or worsening symptoms. She will continue to come in monthly for the IV infusions.  Valdemar Mcclenahan A. Epimenio Foot, MD, PhD 01/24/2017, 9:01 AM Certified in Neurology, Clinical Neurophysiology, Sleep Medicine, Pain Medicine and Neuroimaging  [facility] [address], [address] [place], [place] [address] [phone]

## 2017-01-25 LAB — CBC WITH DIFFERENTIAL/PLATELET
BASOS ABS: 0.1 10*3/uL (ref 0.0–0.2)
Basos: 1 %
EOS (ABSOLUTE): 0.2 10*3/uL (ref 0.0–0.4)
Eos: 3 %
Hematocrit: 37.9 % (ref 34.0–46.6)
Hemoglobin: 12.6 g/dL (ref 11.1–15.9)
IMMATURE GRANS (ABS): 0 10*3/uL (ref 0.0–0.1)
Immature Granulocytes: 0 %
LYMPHS: 56 %
Lymphocytes Absolute: 4.1 10*3/uL — ABNORMAL HIGH (ref 0.7–3.1)
MCH: 30.1 pg (ref 26.6–33.0)
MCHC: 33.2 g/dL (ref 31.5–35.7)
MCV: 91 fL (ref 79–97)
Monocytes Absolute: 0.3 10*3/uL (ref 0.1–0.9)
Monocytes: 4 %
Neutrophils Absolute: 2.7 10*3/uL (ref 1.4–7.0)
Neutrophils: 36 %
PLATELETS: 256 10*3/uL (ref 150–379)
RBC: 4.18 x10E6/uL (ref 3.77–5.28)
RDW: 13.3 % (ref 12.3–15.4)
WBC: 7.4 10*3/uL (ref 3.4–10.8)

## 2017-01-31 ENCOUNTER — Encounter: Payer: Self-pay | Admitting: *Deleted

## 2017-02-13 ENCOUNTER — Encounter: Payer: Self-pay | Admitting: Emergency Medicine

## 2017-02-13 DIAGNOSIS — Z79899 Other long term (current) drug therapy: Secondary | ICD-10-CM | POA: Insufficient documentation

## 2017-02-13 DIAGNOSIS — R350 Frequency of micturition: Secondary | ICD-10-CM | POA: Diagnosis present

## 2017-02-13 DIAGNOSIS — N3000 Acute cystitis without hematuria: Secondary | ICD-10-CM | POA: Insufficient documentation

## 2017-02-13 LAB — URINALYSIS, ROUTINE W REFLEX MICROSCOPIC
Bilirubin Urine: NEGATIVE
Glucose, UA: NEGATIVE mg/dL
Hgb urine dipstick: NEGATIVE
KETONES UR: 5 mg/dL — AB
Nitrite: NEGATIVE
PROTEIN: NEGATIVE mg/dL
Specific Gravity, Urine: 1.028 (ref 1.005–1.030)
pH: 5 (ref 5.0–8.0)

## 2017-02-13 LAB — POCT PREGNANCY, URINE: PREG TEST UR: NEGATIVE

## 2017-02-13 NOTE — ED Triage Notes (Signed)
Pt states history of frequent urination and uti in past. Pt states today was having low back pain that has now extending to pelvis. Pt states she also has pain after urinating.

## 2017-02-14 ENCOUNTER — Emergency Department
Admission: EM | Admit: 2017-02-14 | Discharge: 2017-02-14 | Disposition: A | Discharge status: Home / Self Care | Payer: BLUE CROSS/BLUE SHIELD | Attending: Emergency Medicine | Admitting: Emergency Medicine

## 2017-02-14 ENCOUNTER — Telehealth: Payer: Self-pay | Admitting: Family Medicine

## 2017-02-14 DIAGNOSIS — N3000 Acute cystitis without hematuria: Secondary | ICD-10-CM

## 2017-02-14 MED ORDER — SULFAMETHOXAZOLE-TRIMETHOPRIM 800-160 MG PO TABS
1.0000 | ORAL_TABLET | Freq: Once | ORAL | Status: AC
  Administered 2017-02-14: 1 via ORAL
  Filled 2017-02-14: qty 1

## 2017-02-14 MED ORDER — FLUCONAZOLE 150 MG PO TABS: 150.0000 mg | ORAL_TABLET | Freq: Once | ORAL | 0 refills | Status: AC

## 2017-02-14 MED ORDER — SULFAMETHOXAZOLE-TRIMETHOPRIM 800-160 MG PO TABS: 1.0000 | ORAL_TABLET | Freq: Two times a day (BID) | ORAL | 0 refills | Status: AC

## 2017-02-14 NOTE — Telephone Encounter (Signed)
Yes, Rx sent 

## 2017-02-14 NOTE — Telephone Encounter (Signed)
Pt states she went to the ER last night for UTI and was put on Bactrium and now she is experiencing symptoms of a yeast infection. Pt would like to know if Diflucan could be called into [facility] [address]

## 2017-02-15 LAB — URINE CULTURE

## 2017-02-15 NOTE — ED Provider Notes (Signed)
[facility] Emergency Department Provider Note    I have reviewed the triage vital signs and the nursing notes.   HISTORY  Chief Complaint Pelvic Pain and Back Pain    HPI Tracey Lindsey is a 26 y.o. female with history of multiple sclerosis and frequent urinary tract infections presents to the emergency department with urinary frequency and urgency as well as low back pain with extension into her pelvis. Patient states that she's had discomfort after urinating. Patient denies any vaginal discharge   Past Medical History:  Diagnosis Date  . MS (multiple sclerosis) [facility]) April 2016    Patient Active Problem List   Diagnosis Date Noted  . High risk medication use 01/24/2017  . Vaginal discharge 11/05/2016  . Other headache syndrome 07/24/2016  . Missed period 05/18/2016  . Obesity 11/19/2015  . Urinary frequency 11/14/2015  . Preventative health care 11/14/2015  . Oral contraceptive prescribed 06/08/2015  . Verruca vulgaris 06/08/2015  . Depression with anxiety 05/26/2015  . Weakness of left arm 05/26/2015  . Vertigo 04/04/2015  . Numbness 04/04/2015  . Vitamin D deficiency 04/04/2015  . 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 Anxiety state 04/04/2015  . Insomnia 04/04/2015  . Other fatigue 04/04/2015  . MS (multiple sclerosis) (HCC) 03/04/2015    History reviewed. No pertinent surgical history.  Prior to Admission medications   Medication Sig Start Date End Date Taking? Authorizing Provider  cholecalciferol (VITAMIN D) 1000 units tablet Take 1,000 Units by mouth daily.    Historical Provider, MD  drospirenone-ethinyl estradiol (YASMIN 28) 3-0.03 MG tablet Take 1 tablet by mouth daily. 06/13/16   [name], MD  loratadine (CLARITIN) 10 MG tablet Take 10 mg by mouth daily as needed.     Historical Provider, MD  natalizumab (TYSABRI) 300 MG/15ML injection Inject into the vein.    Historical Provider, MD  sulfamethoxazole-trimethoprim (BACTRIM  DS,SEPTRA DS) 800-160 MG tablet Take 1 tablet by mouth 2 (two) times daily. 02/14/17 02/19/17  Darci Current, MD  TURMERIC PO Take by mouth.    Historical Provider, MD    Allergies Patient has no known allergies.  Family History  Problem Relation Age of Onset  . Diabetes Father   . Hypertension Father   . Stroke Father   . Cancer Mother     lung  . Hyperlipidemia Mother   . Lung disease Mother     Social History Social History  Substance Use Topics  . Smoking status: Never Smoker  . Smokeless tobacco: Never Used  . Alcohol use No    Review of Systems Constitutional: No fever/chills Eyes: No visual changes. ENT: No sore throat. Cardiovascular: Denies chest pain. Respiratory: Denies shortness of breath. Gastrointestinal: No abdominal pain.  No nausea, no vomiting.  No diarrhea.  No constipation. Genitourinary: Negative for dysuria.Positive for urinary frequency and urgency Musculoskeletal: Negative for back pain. Skin: Negative for rash. Neurological: Negative for headaches, focal weakness or numbness.  10-point ROS otherwise negative.  ____________________________________________   PHYSICAL EXAM:  VITAL SIGNS: ED Triage Vitals  Enc Vitals Group     BP 02/13/17 2202 (!) 137/92     Pulse Rate 02/13/17 2202 89     Resp 02/13/17 2202 16     Temp 02/13/17 2202 99.1 F (37.3 C)     Temp Source 02/13/17 2202 Oral     SpO2 02/13/17 2202 100 %     Weight 02/13/17 2203 196 lb (88.9 kg)     Height 02/13/17  2203 5\' 2"  (1.575 m)     Head Circumference --      Peak Flow --      Pain Score 02/13/17 2203 4     Pain Loc --      Pain Edu? --      Excl. in GC? --     Constitutional: Alert and oriented. Well appearing and in no acute distress. Eyes: Conjunctivae are normal. PERRL. EOMI. Head: Atraumatic. Ears:  Healthy appearing ear canals and TMs bilaterally Nose: No congestion/rhinnorhea. Mouth/Throat: Mucous membranes are moist. Oropharynx non-erythematous. Neck:  No stridor.   Cardiovascular: Normal rate, regular rhythm. Good peripheral circulation. Grossly normal heart sounds. Respiratory: Normal respiratory effort.  No retractions. Lungs CTAB. Gastrointestinal: Soft and nontender. No distention.  Musculoskeletal: No lower extremity tenderness nor edema. No gross deformities of extremities. Neurologic:  Normal speech and language. No gross focal neurologic deficits are appreciated.  Skin:  Skin is warm, dry and intact. No rash noted.   ____________________________________________   LABS (all labs ordered are listed, but only abnormal results are displayed)  Labs Reviewed  URINALYSIS, ROUTINE W REFLEX MICROSCOPIC - Abnormal; Notable for the following:       Result Value   Color, Urine YELLOW (*)    APPearance CLEAR (*)    Ketones, ur 5 (*)    Leukocytes, UA SMALL (*)    Bacteria, UA RARE (*)    Squamous Epithelial / LPF 6-30 (*)    All other components within normal limits  URINE CULTURE  POC URINE PREG, ED  POCT PREGNANCY, URINE     Procedures    INITIAL IMPRESSION / ASSESSMENT AND PLAN / ED COURSE  Pertinent labs & imaging results that were available during my care of the patient were reviewed by me and considered in my medical decision making (see chart for details).  Patient states that she's been prescribed Bactrim in the past which was successful and a such was prescribed Bactrim today.      ____________________________________________  FINAL CLINICAL IMPRESSION(S) / ED DIAGNOSES  Final diagnoses:  Acute cystitis without hematuria     MEDICATIONS GIVEN DURING THIS VISIT:  Medications  sulfamethoxazole-trimethoprim (BACTRIM DS,SEPTRA DS) 800-160 MG per tablet 1 tablet (1 tablet Oral Given 02/14/17 0207)     NEW OUTPATIENT MEDICATIONS STARTED DURING THIS VISIT:  Discharge Medication List as of 02/14/2017  2:10 AM    START taking these medications   Details  sulfamethoxazole-trimethoprim (BACTRIM DS,SEPTRA  DS) 800-160 MG tablet Take 1 tablet by mouth 2 (two) times daily., Starting Thu 02/14/2017, Until Tue 02/19/2017, Print        Discharge Medication List as of 02/14/2017  2:10 AM      Discharge Medication List as of 02/14/2017  2:10 AM       Note:  This document was prepared using Dragon voice recognition software and may include unintentional dictation errors.    Darci Current, MD 02/15/17 [phone]

## 2017-03-01 ENCOUNTER — Other Ambulatory Visit: Payer: Self-pay | Admitting: Family Medicine

## 2017-03-01 ENCOUNTER — Encounter: Payer: Self-pay | Admitting: Family Medicine

## 2017-03-01 ENCOUNTER — Ambulatory Visit (INDEPENDENT_AMBULATORY_CARE_PROVIDER_SITE_OTHER): Payer: BLUE CROSS/BLUE SHIELD | Admitting: Family Medicine

## 2017-03-01 VITALS — BP 116/78 | HR 92 | Temp 98.2°F | Resp 16 | Wt 194.1 lb

## 2017-03-01 DIAGNOSIS — Z113 Encounter for screening for infections with a predominantly sexual mode of transmission: Secondary | ICD-10-CM

## 2017-03-01 DIAGNOSIS — R1030 Lower abdominal pain, unspecified: Secondary | ICD-10-CM | POA: Diagnosis not present

## 2017-03-01 DIAGNOSIS — N390 Urinary tract infection, site not specified: Secondary | ICD-10-CM

## 2017-03-01 DIAGNOSIS — R350 Frequency of micturition: Secondary | ICD-10-CM

## 2017-03-01 DIAGNOSIS — Z114 Encounter for screening for human immunodeficiency virus [HIV]: Secondary | ICD-10-CM

## 2017-03-01 NOTE — Progress Notes (Signed)
[facility] staff requested ordered be put in ahead of time for STD screen; orders entered Can cancel one of the trich tests; if wet mount collected, cancel urine trich If no wet mount, then cancel that and just use urine

## 2017-03-01 NOTE — Progress Notes (Signed)
BP 116/78   Pulse 92   Temp 98.2 F (36.8 C) (Oral)   Resp 16   Wt 194 lb 2 oz (88.1 kg)   LMP 02/19/2017   SpO2 97%   BMI 35.51 kg/m    Subjective:    Patient ID: Tracey Lindsey, female    DOB: 1991-02-13, 26 y.o.   MRN: [id number]  HPI: Tracey Lindsey is a 26 y.o. female  Chief Complaint  Patient presents with  . Exposure to STD    screening;  lower abdominal pain    Patient here for an acute visit Two weeks ago, she was in the ER and was treated for a bladder infection; was given antibiotic, then yeast infection medicine Urine culture reviewed; multiple species present She continues to have some lower abdominal discomfort, mild, and wants to be tested for STIs She has had unprotected intercourse LMP 02/19/17  Depression screen [facility] 2/9 03/01/2017 01/11/2017 11/05/2016 05/18/2016  Decreased Interest 0 0 0 0  Down, Depressed, Hopeless 0 0 0 0  PHQ - 2 Score 0 0 0 0   Relevant past medical, surgical, family and social history reviewed Past Medical History:  Diagnosis Date  . MS (multiple sclerosis) [facility]) April 2016   History reviewed. No pertinent surgical history. Family History  Problem Relation Age of Onset  . Diabetes Father   . Hypertension Father   . Stroke Father   . Cancer Mother     lung  . Hyperlipidemia Mother   . Lung disease Mother    Social History  Substance Use Topics  . Smoking status: Never Smoker  . Smokeless tobacco: Never Used  . Alcohol use No   Interim medical history since last visit reviewed. Allergies and medications reviewed  Review of Systems Per HPI unless specifically indicated above     Objective:    BP 116/78   Pulse 92   Temp 98.2 F (36.8 C) (Oral)   Resp 16   Wt 194 lb 2 oz (88.1 kg)   LMP 02/19/2017   SpO2 97%   BMI 35.51 kg/m   Wt Readings from Last 3 Encounters:  03/01/17 194 lb 2 oz (88.1 kg)  02/13/17 196 lb (88.9 kg)  01/24/17 197 lb (89.4 kg)    Physical Exam  Constitutional: She appears  well-developed and well-nourished. No distress.  Eyes: EOM are normal. No scleral icterus.  Cardiovascular: Normal rate and regular rhythm.   Pulmonary/Chest: Effort normal and breath sounds normal.  Abdominal: She exhibits no distension and no mass. There is no tenderness. There is no guarding and no CVA tenderness.  Genitourinary: Uterus is not enlarged and not tender. Cervix exhibits no motion tenderness, no discharge and no friability. Right adnexum displays no mass, no tenderness and no fullness. Left adnexum displays no mass, no tenderness and no fullness. No erythema, tenderness or bleeding in the vagina. No signs of injury around the vagina. Vaginal discharge found.  Lymphadenopathy:       Right: No inguinal adenopathy present.       Left: No inguinal adenopathy present.  Skin: No rash noted. No pallor.  Psychiatric: She has a normal mood and affect. Her behavior is normal. Judgment and thought content normal. Her mood appears not anxious. She does not exhibit a depressed mood.   Results for orders placed or performed during the hospital encounter of 02/14/17  Urine culture  Result Value Ref Range   Specimen Description URINE, CLEAN CATCH    Special Requests  NONE    Culture MULTIPLE SPECIES PRESENT, SUGGEST RECOLLECTION (A)    Report Status 02/15/2017 FINAL   Urinalysis, Routine w reflex microscopic  Result Value Ref Range   Color, Urine YELLOW (A) YELLOW   APPearance CLEAR (A) CLEAR   Specific Gravity, Urine 1.028 1.005 - 1.030   pH 5.0 5.0 - 8.0   Glucose, UA NEGATIVE NEGATIVE mg/dL   Hgb urine dipstick NEGATIVE NEGATIVE   Bilirubin Urine NEGATIVE NEGATIVE   Ketones, ur 5 (A) NEGATIVE mg/dL   Protein, ur NEGATIVE NEGATIVE mg/dL   Nitrite NEGATIVE NEGATIVE   Leukocytes, UA SMALL (A) NEGATIVE   RBC / HPF 0-5 0 - 5 RBC/hpf   WBC, UA 0-5 0 - 5 WBC/hpf   Bacteria, UA RARE (A) NONE SEEN   Squamous Epithelial / LPF 6-30 (A) NONE SEEN   Mucous PRESENT   Pregnancy, urine POC    Result Value Ref Range   Preg Test, Ur NEGATIVE NEGATIVE      Assessment & Plan:   Problem List Items Addressed This Visit    None    Visit Diagnoses    Lower abdominal pain    -  Primary   will check labs, urine, r/o STI; if symptoms persist despite negative work-up, we can get a pelvic US   Relevant Orders   Urinalysis w microscopic + reflex cultur   Routine screening for STI (sexually transmitted infection)       screening tests done; safe sex encouraged; carry female / female condoms for self or partner       Follow up plan: Return if symptoms worsen or fail to improve.  An after-visit summary was printed and given to the patient at check-out.  Please see the patient instructions which may contain other information and recommendations beyond what is mentioned above in the assessment and plan.  No orders of the defined types were placed in this encounter.   Orders Placed This Encounter  Procedures  . Urinalysis w microscopic + reflex cultur

## 2017-03-01 NOTE — Assessment & Plan Note (Signed)
Check labs, urine and blood

## 2017-03-01 NOTE — Assessment & Plan Note (Signed)
screen 

## 2017-03-01 NOTE — Patient Instructions (Addendum)
We'll contact you about the lab results  Safe Sex Practicing safe sex means taking steps before and during sex to reduce your risk of:  Getting an STD (sexually transmitted disease).  Giving your partner an STD.  Unwanted pregnancy. How can I practice safe sex?   To practice safe sex:  Limit your sexual partners to only one partner who is having sex with only you.  Avoid using alcohol and recreational drugs before having sex. These substances can affect your judgment.  Before having sex with a new partner:  Talk to your partner about past partners, past STDs, and drug use.  You and your partner should be screened for STDs and discuss the results with each other.  Check your body regularly for sores, blisters, rashes, or unusual discharge. If you notice any of these problems, visit your health care provider.  If you have symptoms of an infection or you are being treated for an STD, avoid sexual contact.  While having sex, use a condom. Make sure to:  Use a condom every time you have vaginal, oral, or anal sex. Both females and males should wear condoms during oral sex.  Keep condoms in place from the beginning to the end of sexual activity.  Use a latex condom, if possible. Latex condoms offer the best protection.  Use only water-based lubricants or oils to lubricate a condom. Using petroleum-based lubricants or oils will weaken the condom and increase the chance that it will break.  See your health care provider for regular screenings, exams, and tests for STDs.  Talk with your health care provider about the form of birth control (contraception) that is best for you.  Get vaccinated against hepatitis B and human papillomavirus (HPV).  If you are at risk of being infected with HIV (human immunodeficiency virus), talk with your health care provider about taking a prescription medicine to prevent HIV infection. You are considered at risk for HIV if:  You are a man who has  sex with other men.  You are a heterosexual man or woman who is sexually active with more than one partner.  You take drugs by injection.  You are sexually active with a partner who has HIV. This information is not intended to replace advice given to you by your health care provider. Make sure you discuss any questions you have with your health care provider. Document Released: 12/27/2004 Document Revised: 04/04/2016 Document Reviewed: 10/09/2015 Elsevier Interactive Patient Education  2017 [employer].

## 2017-03-02 LAB — HEPATITIS PANEL, ACUTE
HCV AB: NEGATIVE
HEP B S AG: NEGATIVE
Hep A IgM: NONREACTIVE
Hep B C IgM: NONREACTIVE

## 2017-03-02 LAB — URINALYSIS W MICROSCOPIC + REFLEX CULTURE
BACTERIA UA: NONE SEEN [HPF]
Bilirubin Urine: NEGATIVE
Casts: NONE SEEN [LPF]
Crystals: NONE SEEN [HPF]
GLUCOSE, UA: NEGATIVE
Hgb urine dipstick: NEGATIVE
Ketones, ur: NEGATIVE
LEUKOCYTES UA: NEGATIVE
NITRITE: NEGATIVE
PH: 7.5 (ref 5.0–8.0)
Protein, ur: NEGATIVE
Specific Gravity, Urine: 1.022 (ref 1.001–1.035)
WBC, UA: NONE SEEN WBC/HPF (ref <=5)
Yeast: NONE SEEN [HPF]

## 2017-03-02 LAB — GC/CHLAMYDIA PROBE AMP
CT PROBE, AMP APTIMA: NOT DETECTED
GC Probe RNA: NOT DETECTED

## 2017-03-02 LAB — WET PREP BY MOLECULAR PROBE
Candida species: NOT DETECTED
GARDNERELLA VAGINALIS: NOT DETECTED
Trichomonas vaginosis: NOT DETECTED

## 2017-03-02 LAB — RPR

## 2017-03-02 LAB — HIV ANTIBODY (ROUTINE TESTING W REFLEX): HIV 1&2 Ab, 4th Generation: NONREACTIVE

## 2017-05-12 ENCOUNTER — Other Ambulatory Visit: Payer: Self-pay | Admitting: Family Medicine

## 2017-05-13 NOTE — Telephone Encounter (Signed)
Last pap smear reviewed; Rx approved

## 2017-05-22 ENCOUNTER — Telehealth: Payer: Self-pay | Admitting: Neurology

## 2017-05-22 NOTE — Telephone Encounter (Signed)
Debbie from [employer] re: the authorization of Ludwig Clarks it expired on 05-18-2017 needs to be completed asap, she said it was refaxed earlier today please complete form if questions [phone]

## 2017-05-22 NOTE — Telephone Encounter (Signed)
Left message for Tracey Lindsey that she needs to contact [facility].  I do not have access to their pa's/fim 

## 2017-06-13 ENCOUNTER — Telehealth: Payer: Self-pay | Admitting: *Deleted

## 2017-06-13 MED ORDER — LORAZEPAM 0.5 MG PO TABS: 0.5000 mg | ORAL_TABLET | Freq: Two times a day (BID) | ORAL | 0 refills | Status: DC | PRN

## 2017-06-13 NOTE — Telephone Encounter (Signed)
Tracey Lindsey requested Lorazepam r/f at infusion appt. this morning.  Rx. faxed to [facility] per her request/fim

## 2017-07-24 ENCOUNTER — Ambulatory Visit (INDEPENDENT_AMBULATORY_CARE_PROVIDER_SITE_OTHER): Payer: BLUE CROSS/BLUE SHIELD | Admitting: Neurology

## 2017-07-24 ENCOUNTER — Encounter: Payer: Self-pay | Admitting: Neurology

## 2017-07-24 VITALS — BP 125/82 | HR 97 | Resp 16 | Ht 62.0 in | Wt 201.5 lb

## 2017-07-24 DIAGNOSIS — R350 Frequency of micturition: Secondary | ICD-10-CM | POA: Diagnosis not present

## 2017-07-24 DIAGNOSIS — R260 Ataxic gait: Secondary | ICD-10-CM | POA: Diagnosis not present

## 2017-07-24 DIAGNOSIS — Z79899 Other long term (current) drug therapy: Secondary | ICD-10-CM | POA: Diagnosis not present

## 2017-07-24 DIAGNOSIS — G4700 Insomnia, unspecified: Secondary | ICD-10-CM

## 2017-07-24 DIAGNOSIS — G35 Multiple sclerosis: Secondary | ICD-10-CM | POA: Diagnosis not present

## 2017-07-24 DIAGNOSIS — F418 Other specified anxiety disorders: Secondary | ICD-10-CM | POA: Diagnosis not present

## 2017-07-24 DIAGNOSIS — G4489 Other headache syndrome: Secondary | ICD-10-CM | POA: Diagnosis not present

## 2017-07-24 NOTE — Progress Notes (Signed)
[facility]  PATIENT: MONROE TOURE DOB: Nov 11, 1991  REFERRING DOCTOR OR PCP:  Luan Pulling Jaci Lazier) SOURCE: Patient and ER records and PACS images  _________________________________   HISTORICAL  CHIEF COMPLAINT:  Chief Complaint  Patient presents with  . Multiple Sclerosis    Sts. she continues to tolerate Tysabri well.  JCV ab last checked 01/24/17 and was negative at 0.08.  Sts. 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  Imipramine has helped in the past/fim    HISTORY OF PRESENT ILLNESS:  Khamil Lamica is a 26 year old woman recently diagnosed with MS.    She is having headaches almost every night.  MS:  She is on Tysabri and tolerates it well.  She is JCV Ab negative (0.08  on 01/24/2017)   She denies any new exacerbation.   Cervical spine and brain November 2017 was personally reviewed. There were no new lesions. The previously enhancing lesions were smaller and no longer enhanced.  Headaches:   She is still getting frequent headaches. (>25/30 a month).   Pain is bilateral and is usually mild to moderate.   It builds up daily at work.  HA is sometimes present when she wakes up though.  She gets a pressure sensation.  HA is usually milder the next morning.   It only seems to occur when she tries to go to sleep.  No nausea or vomiting.   Moving does not alter the pain much.   She denies occipital pain.    She notes pain in the back of the head/upper neck as well.   She does not take anything for the headache.  She is not sure if imipramine helped any when she tried in the past.  Gait/strength/sensation:   Her gait is back to baseline. She denies any weakness or spasticity in the arms or legs. There is no numbness.   Bladder/bowel:  She has urinary frequency only at night.    She has 3-4 times nocturia many nights.   No  incontinence.  No recent UTI's.   No hesitancy.   Bowel is fine  Vision/ears:  Diplopia resolved. She denies any history of optic neuritis. She does not have any visual acuity issues or problems with color vision..  Fatigue/sleep:   She reports mild fatigue. This is worse with heat. She is working full-time.      She wakes up some at night but usually falls back asleep.   he is sleeping worse with frequent awakenings for nocturia.     Imipramine helped but caused a hangover.     Mood/Cognition:   She notes some anxiety but no depression.   She denies any significant cognitive change.   MS History:   In retrospect, she had an episode of vertigo in 2014 with mild vision changes that improved.    In March 2016, she had vertigo that improved after a couple weeks.  In late April, she had  numbness in the left foot. When she woke up the next morning, she noted that the numbness was higher up the left arm and the next day it also involved the left hand and she went to the emergency room. In the emergency room, she had an MRI of the brain that showed white matter foci were some for multiple sclerosis.   MRI of the brain with and  without contrast 03/29/15 showed several posterior fossa lesions with one in the right middle cerebellar peduncle and several in the pons.   The middle cerebellar peduncle focus and 2 of the pontine foci enhanced. There are also several periventricular and deep white matter foci in the cerebral hemispheres.   MRI cervical spine shows one enhancing focus.  Repeat imaging of the brain and cervical spine November 2017 did not show any new lesions. The previously enhancing lesions were smaller and did not enhance.Marland Kitchen       REVIEW OF SYSTEMS: Constitutional: No fevers, chills, sweats, or change in appetite Eyes: No visual changes, double vision, eye pain Ear, nose and throat: No hearing loss, ear pain, nasal congestion, sore throat Cardiovascular: No chest pain, palpitations Respiratory:  No shortness of breath at rest or with exertion.   No wheezes GastrointestinaI: No nausea, vomiting, diarrhea, abdominal pain, fecal incontinence Genitourinary: Notes frequency, nocturia (x2) Musculoskeletal: No neck pain, back pain Integumentary: No rash, pruritus, skin lesions Neurological: as above Psychiatric: as above  Endocrine: No palpitations, diaphoresis, change in appetite, change in weigh or increased thirst Hematologic/Lymphatic: No anemia, purpura, petechiae. Allergic/Immunologic: No itchy/runny eyes, nasal congestion, recent allergic reactions, rashes  ALLERGIES: No Known Allergies  HOME MEDICATIONS:  Current Outpatient Prescriptions:  .  cholecalciferol (VITAMIN D) 1000 units tablet, Take 1,000 Units by mouth daily., Disp: , Rfl:  .  drospirenone-ethinyl estradiol (YASMIN,ZARAH,SYEDA) 3-0.03 MG tablet, take 1 tablet by mouth once daily, Disp: 28 tablet, Rfl: 6 .  loratadine (CLARITIN) 10 MG tablet, Take 10 mg by mouth daily as needed. , Disp: , Rfl:  .  LORazepam (ATIVAN) 0.5 MG tablet, Take 1 tablet (0.5 mg total) by mouth 2 (two) times daily as needed for anxiety., Disp: 15 tablet, Rfl: 0 .  natalizumab (TYSABRI) 300 MG/15ML injection, Inject into the vein., Disp: , Rfl:  .  TURMERIC PO, Take by mouth., Disp: , Rfl:  No current facility-administered medications for this visit.   Facility-Administered Medications Ordered in Other Visits:  .  gadopentetate dimeglumine (MAGNEVIST) injection 18 mL, 18 mL, Intravenous, Once PRN, Amil Bouwman, Pearletha Furl, MD  PAST MEDICAL HISTORY: Past Medical History:  Diagnosis Date  . MS (multiple sclerosis) (HCC) April 2016    PAST SURGICAL HISTORY: No past surgical history on file.  FAMILY HISTORY: Family History  Problem Relation Age of Onset  . Diabetes Father   . Hypertension Father   . Stroke Father   . Cancer Mother        lung  . Hyperlipidemia Mother   . Lung disease Mother     SOCIAL HISTORY:  Social History    Social History  . Marital status: Single    Spouse name: N/A  . Number of children: N/A  . Years of education: N/A   Occupational History  . unemployeed     Social History Main Topics  . Smoking status: Never Smoker  . Smokeless tobacco: Never Used  . Alcohol use No  . Drug use: No  . Sexual activity: Yes    Birth control/ protection: Pill   Other Topics Concern  . Not on file   Social History Narrative   Lives with mother    Drinks soda daily (8-10 ounces)     PHYSICAL EXAM  Vitals:   07/24/17 0828  BP: 125/82  Pulse: 97  Resp: 16  Weight: 201 lb 8 oz (91.4 kg)  Height: 5\' 2"  (1.575 m)    Body mass index is 36.85 kg/m.  General: The patient is well-developed and well-nourished and in no acute distress  Skin: Extremities are without significant edema.  Musculoskeletal:  Back is nontender  Neurologic Exam  Mental status: The patient is alert and oriented x 3 at the time of the examination. The patient has apparent normal recent and remote memory, with an apparently normal attention span and concentration ability.   Speech is normal.  Cranial nerves:   Extraocular muscles are intact. Facial strength and sensation is normal. Palatal elevation. Tongue protrusion is midline. Trapezius strength is normal. No obvious hearing deficits are noted.  Motor:  Muscle bulk is normal.   Tone is slightly increased in left arm. Strength is  5 / 5 in right arm and legs but 4+/5 in left arm with decreased arm rolling and drift also observed.   .   Sensory:   She has normal sensation to touch and vibration in the arms or legs.   Coordination: Cerebellar testing reveals good finger-nose-finger on the right but reduced on left.     Gait and station: Station is normal.   Gait is normal. Tandem gait is very mildly wide. Romberg is negative now.   Reflexes: Deep tendon reflexes are symmetric and increased at the knees with spread. There was no ankle clonus. Reflexes were plus  in the arms  .    DIAGNOSTIC DATA (LABS, IMAGING, TESTING) - I reviewed patient records, labs, notes, testing and imaging myself where available.  Lab Results  Component Value Date   WBC 7.4 01/24/2017   HGB 12.6 01/24/2017   HCT 37.9 01/24/2017   MCV 91 01/24/2017   PLT 256 01/24/2017      Component Value Date/Time   NA 138 11/14/2015 1417   K 3.8 11/14/2015 1417   CL 101 11/14/2015 1417   CO2 24 11/14/2015 1417   GLUCOSE 73 11/14/2015 1417   GLUCOSE 94 03/31/2015 1015   BUN 9 11/14/2015 1417   CREATININE 0.63 11/14/2015 1417   CALCIUM 9.3 11/14/2015 1417   PROT 6.7 11/14/2015 1417   ALBUMIN 4.6 11/14/2015 1417   AST 17 11/14/2015 1417   ALT 13 11/14/2015 1417   ALKPHOS 48 11/14/2015 1417   BILITOT 0.5 11/14/2015 1417   GFRNONAA 126 11/14/2015 1417   GFRAA 145 11/14/2015 1417   No results found.    ASSESSMENT AND PLAN  MS (multiple sclerosis) (HCC) - Plan: Stratify JCV Antibody Test ([facility]), CBC with Differential/Platelet  Ataxic gait  Insomnia, unspecified type  Depression with anxiety  High risk medication use - Plan: Stratify JCV Antibody Test ([facility]), CBC with Differential/Platelet  Other headache syndrome  Urinary frequency   1.   She will continue Tysabri. We will check a CBC and a JCV antibody today.  2.   She will retry imipramine for the urinary frequency, insomnia and headaches.  3.  Stay active and exercise as tolerated.  4.    She will return to see me in 6 months for regular visits and call sooner if she has new or worsening symptoms. She will continue to come in monthly for the IV infusions.  Rosangelica Pevehouse A. Epimenio Foot, MD, PhD 07/24/2017, 12:56 PM Certified in Neurology, Clinical Neurophysiology, Sleep Medicine, Pain Medicine and Neuroimaging  [facility] [address], [address] [place], [place] [address] [phone]

## 2017-07-25 LAB — CBC WITH DIFFERENTIAL/PLATELET
BASOS: 1 %
Basophils Absolute: 0.1 10*3/uL (ref 0.0–0.2)
EOS (ABSOLUTE): 0.3 10*3/uL (ref 0.0–0.4)
EOS: 3 %
HEMATOCRIT: 36.9 % (ref 34.0–46.6)
HEMOGLOBIN: 12.9 g/dL (ref 11.1–15.9)
Immature Grans (Abs): 0 10*3/uL (ref 0.0–0.1)
Immature Granulocytes: 0 %
LYMPHS ABS: 3.3 10*3/uL — AB (ref 0.7–3.1)
Lymphs: 38 %
MCH: 30.6 pg (ref 26.6–33.0)
MCHC: 35 g/dL (ref 31.5–35.7)
MCV: 87 fL (ref 79–97)
MONOCYTES: 5 %
Monocytes Absolute: 0.4 10*3/uL (ref 0.1–0.9)
Neutrophils Absolute: 4.6 10*3/uL (ref 1.4–7.0)
Neutrophils: 53 %
Platelets: 250 10*3/uL (ref 150–379)
RBC: 4.22 x10E6/uL (ref 3.77–5.28)
RDW: 13.8 % (ref 12.3–15.4)
WBC: 8.7 10*3/uL (ref 3.4–10.8)

## 2017-08-07 ENCOUNTER — Encounter: Payer: Self-pay | Admitting: *Deleted

## 2017-08-23 ENCOUNTER — Ambulatory Visit (INDEPENDENT_AMBULATORY_CARE_PROVIDER_SITE_OTHER): Payer: 59 | Admitting: Family Medicine

## 2017-08-23 ENCOUNTER — Other Ambulatory Visit: Payer: Self-pay | Admitting: Family Medicine

## 2017-08-23 ENCOUNTER — Encounter: Payer: Self-pay | Admitting: Family Medicine

## 2017-08-23 VITALS — BP 108/64 | HR 96 | Temp 97.9°F | Resp 16 | Ht 62.0 in | Wt 201.6 lb

## 2017-08-23 DIAGNOSIS — R350 Frequency of micturition: Secondary | ICD-10-CM | POA: Diagnosis not present

## 2017-08-23 LAB — POCT URINALYSIS DIPSTICK
Bilirubin, UA: NEGATIVE
Blood, UA: NEGATIVE
Glucose, UA: NEGATIVE
Ketones, UA: NEGATIVE
LEUKOCYTES UA: NEGATIVE
NITRITE UA: NEGATIVE
Spec Grav, UA: >=1.03 — AB (ref 1.010–1.025)
UROBILINOGEN UA: 0.2 U/dL
pH, UA: 6 (ref 5.0–8.0)

## 2017-08-23 MED ORDER — FLUCONAZOLE 150 MG PO TABS: 150.0000 mg | ORAL_TABLET | Freq: Once | ORAL | 0 refills | Status: AC

## 2017-08-23 MED ORDER — SULFAMETHOXAZOLE-TRIMETHOPRIM 800-160 MG PO TABS: 1.0000 | ORAL_TABLET | Freq: Two times a day (BID) | ORAL | 0 refills | Status: DC

## 2017-08-23 MED ORDER — AMOXICILLIN 250 MG PO CAPS: 250.0000 mg | ORAL_CAPSULE | Freq: Three times a day (TID) | ORAL | 0 refills | Status: DC

## 2017-08-23 NOTE — Patient Instructions (Signed)
Stay hydrated Consider cranberry pills or cranberry juice for prevention only in the future Start the antibiotics Seek medical attention over the weekend if needed

## 2017-08-23 NOTE — Progress Notes (Signed)
Patient called and spoke with nurse She is going to [place] and doesn't think she can drink alcohol while on bactrim, wants something else I called pharmacist, as I have never heard of alcohol warning with sulfonamides Pharmacist looked up and did not find any concern I think bactrim should be fine, but did call in amox 250 mg TID if patient refuses to take bactrim She might be thinking of metronidazole

## 2017-08-23 NOTE — Progress Notes (Signed)
BP 108/64 (BP Location: Right Arm, Patient Position: Sitting, Cuff Size: Large)   Pulse 96   Temp 97.9 F (36.6 C) (Oral)   Resp 16   Ht  (1.575 m)   Wt 201 lb 9.6 oz (91.4 kg)   LMP 08/05/2017 (Exact Date)   SpO2 98%   Breastfeeding? No   BMI 36.87 kg/m    Subjective:    Patient ID: Tracey Lindsey, female    DOB: 1991/08/15, 26 y.o.   MRN: [id number]  HPI: Tracey Lindsey is a 26 y.o. female  Chief Complaint  Patient presents with  . Urinary Tract Infection    patient was treated with Macrobid (7 day) for UTI.   . Back Pain    mid back pain  . Urinary Frequency    abnormal    HPI Patient is here for an acute visit Was treated for UTI with macrobid first of sept Having some mid back pain No urinary odor Just frequency and urgency Trying to hold urine at work Did go to [employer] and did not void for four hours No fever No blood in the urine Moving bowels just fine, not every single day, but not constipated  Depression screen [facility] 2/9 08/23/2017 03/01/2017 01/11/2017 11/05/2016 05/18/2016  Decreased Interest 0 0 0 0 0  Down, Depressed, Hopeless 0 0 0 0 0  PHQ - 2 Score 0 0 0 0 0    Relevant past medical, surgical, family and social history reviewed Past Medical History:  Diagnosis Date  . MS (multiple sclerosis) (HCC) April 2016   No past surgical history on file. Family History  Problem Relation Age of Onset  . Diabetes Father   . Hypertension Father   . Stroke Father   . Cancer Mother        lung  . Hyperlipidemia Mother   . Lung disease Mother    Social History   Social History  . Marital status: Single    Spouse name: N/A  . Number of children: N/A  . Years of education: N/A   Occupational History  . unemployeed     Social History Main Topics  . Smoking status: Never Smoker  . Smokeless tobacco: Never Used  . Alcohol use No  . Drug use: No  . Sexual activity: Yes    Partners: Male    Birth control/ protection: Pill   Other Topics  Concern  . Not on file   Social History Narrative   Lives with mother    Drinks soda daily (8-10 ounces)   Interim medical history since last visit reviewed. Allergies and medications reviewed  Review of Systems Per HPI unless specifically indicated above     Objective:    BP 108/64 (BP Location: Right Arm, Patient Position: Sitting, Cuff Size: Large)   Pulse 96   Temp 97.9 F (36.6 C) (Oral)   Resp 16   Ht  (1.575 m)   Wt 201 lb 9.6 oz (91.4 kg)   LMP 08/05/2017 (Exact Date)   SpO2 98%   Breastfeeding? No   BMI 36.87 kg/m   Wt Readings from Last 3 Encounters:  08/23/17 201 lb 9.6 oz (91.4 kg)  07/24/17 201 lb 8 oz (91.4 kg)  03/01/17 194 lb 2 oz (88.1 kg)    Physical Exam  Constitutional: She appears well-developed and well-nourished.  HENT:  Mouth/Throat: Mucous membranes are normal.  Eyes: EOM are normal. No scleral icterus.  Cardiovascular: Normal rate and regular  rhythm.   Pulmonary/Chest: Effort normal and breath sounds normal.  Abdominal: Soft. Normal appearance. There is tenderness in the suprapubic area. There is no guarding and no CVA tenderness.  Psychiatric: She has a normal mood and affect. Her behavior is normal. Her mood appears not anxious. She does not exhibit a depressed mood.    Results for orders placed or performed in visit on 08/23/17  POCT urinalysis dipstick  Result Value Ref Range   Color, UA yellow    Clarity, UA clear    Glucose, UA negative    Bilirubin, UA negative    Ketones, UA negative    Spec Grav, UA >=1.030 (A) 1.010 - 1.025   Blood, UA negative    pH, UA 6.0 5.0 - 8.0   Protein, UA trace    Urobilinogen, UA 0.2 0.2 or 1.0 E.U./dL   Nitrite, UA negative    Leukocytes, UA Negative Negative      Assessment & Plan:   Problem List Items Addressed This Visit      Other   Urinary frequency - Primary    culture urine pending; suspect UTI based on symptoms; will treat with bactrim over the weekend; hydration, cranberry  pills for prevention; diflucan prn      Relevant Orders   POCT urinalysis dipstick (Completed)   Urine Culture       Follow up plan: No Follow-up on file.  An after-visit summary was printed and given to the patient at check-out.  Please see the patient instructions which may contain other information and recommendations beyond what is mentioned above in the assessment and plan.  Meds ordered this encounter  Medications  . sulfamethoxazole-trimethoprim (BACTRIM DS,SEPTRA DS) 800-160 MG tablet    Sig: Take 1 tablet by mouth 2 (two) times daily.    Dispense:  6 tablet    Refill:  0  . fluconazole (DIFLUCAN) 150 MG tablet    Sig: Take 1 tablet (150 mg total) by mouth once.    Dispense:  1 tablet    Refill:  0    Orders Placed This Encounter  Procedures  . Urine Culture  . POCT urinalysis dipstick

## 2017-08-23 NOTE — Assessment & Plan Note (Signed)
culture urine pending; suspect UTI based on symptoms; will treat with bactrim over the weekend; hydration, cranberry pills for prevention; diflucan prn

## 2017-08-25 LAB — URINE CULTURE
MICRO NUMBER:: 81049104
SPECIMEN QUALITY: ADEQUATE

## 2017-08-28 ENCOUNTER — Telehealth: Payer: Self-pay | Admitting: Family Medicine

## 2017-08-28 MED ORDER — AMOXICILLIN 250 MG PO CAPS
250.0000 mg | ORAL_CAPSULE | Freq: Three times a day (TID) | ORAL | 0 refills | Status: DC
Start: 2017-08-28 — End: 2017-08-28

## 2017-08-28 MED ORDER — AMOXICILLIN 250 MG PO CAPS
250.0000 mg | ORAL_CAPSULE | Freq: Three times a day (TID) | ORAL | 0 refills | Status: DC
Start: 2017-08-28 — End: 2018-06-17

## 2017-08-28 NOTE — Telephone Encounter (Signed)
sent 

## 2017-08-28 NOTE — Addendum Note (Signed)
Addended by: Jahanna Raether, Janit Bern on: 08/28/2017 05:13 PM   Modules accepted: Orders

## 2017-08-28 NOTE — Telephone Encounter (Signed)
Pt was prescribed a three day course of antibiotics. She is still experiencing symptoms (pressure, urgency, and frequency) and would like to know if you could give her another round. [facility] send to [facility]  [phone]

## 2017-09-18 ENCOUNTER — Telehealth: Payer: Self-pay

## 2017-09-18 NOTE — Telephone Encounter (Signed)
Called pt to inquire about other forms of OCPs that she has taken in order to get prior auth completed. No answer. LM for pt to call back.

## 2017-09-20 DIAGNOSIS — G35 Multiple sclerosis: Secondary | ICD-10-CM | POA: Diagnosis not present

## 2017-10-18 DIAGNOSIS — G35 Multiple sclerosis: Secondary | ICD-10-CM | POA: Diagnosis not present

## 2017-10-31 ENCOUNTER — Telehealth: Payer: Self-pay | Admitting: Family Medicine

## 2017-10-31 NOTE — Telephone Encounter (Signed)
Copied from CRM [id number]. Topic: Inquiry >> Oct 31, 2017  2:10 PM Viviann Spare wrote: Reason for CRM: Medical Review has called several times to see if the patient has been on any other oral contraceptives other than drospirenone-ethinyl estradiol (YASMIN,ZARAH,SYEDA) 3-0.03 MG tablet. They are requesting a call back @ [phone], ref# [id number]. Liliana Cline stated the requests has been faxed to the office twice already. If they do not hear back from the office patient case will be denied.

## 2017-10-31 NOTE — Telephone Encounter (Signed)
Please call them back I thought this was already resolved We sent in a form that I signed on 09/23/2017 (see under media tab)

## 2017-11-01 MED ORDER — NORGESTIM-ETH ESTRAD TRIPHASIC 0.18/0.215/0.25 MG-25 MCG PO TABS: 1.0000 | ORAL_TABLET | Freq: Every day | ORAL | 11 refills | Status: DC

## 2017-11-01 NOTE — Telephone Encounter (Signed)
Patient notified

## 2017-11-01 NOTE — Telephone Encounter (Signed)
Please let pt know I'll switch oral contraceptives Very important to take at the exact same time of day every day Thank you

## 2017-11-01 NOTE — Telephone Encounter (Signed)
I called and they states the med had been denied, she will have to try something else.

## 2017-11-01 NOTE — Addendum Note (Signed)
Addended by: Ron Beske, Janit Bern on: 11/01/2017 12:43 PM   Modules accepted: Orders

## 2017-11-05 ENCOUNTER — Encounter: Payer: Self-pay | Admitting: *Deleted

## 2017-11-14 DIAGNOSIS — G35 Multiple sclerosis: Secondary | ICD-10-CM | POA: Diagnosis not present

## 2017-12-11 ENCOUNTER — Telehealth: Payer: 59 | Admitting: Nurse Practitioner

## 2017-12-11 ENCOUNTER — Ambulatory Visit: Payer: 59 | Admitting: Family Medicine

## 2017-12-11 DIAGNOSIS — R05 Cough: Secondary | ICD-10-CM | POA: Diagnosis not present

## 2017-12-11 DIAGNOSIS — R059 Cough, unspecified: Secondary | ICD-10-CM

## 2017-12-11 MED ORDER — AZITHROMYCIN 250 MG PO TABS: ORAL_TABLET | ORAL | 0 refills | Status: DC

## 2017-12-11 MED ORDER — BENZONATATE 100 MG PO CAPS: 100.0000 mg | ORAL_CAPSULE | Freq: Three times a day (TID) | ORAL | 0 refills | Status: DC | PRN

## 2017-12-11 MED FILL — AZITHROMYCIN 250 MG TAB: 250 | 5 days supply | Qty: 6 | Fill #0

## 2017-12-11 MED FILL — BENZONATATE 100 MG CAP: 100 | 7 days supply | Qty: 20 | Fill #0

## 2017-12-11 NOTE — Progress Notes (Signed)

## 2017-12-18 DIAGNOSIS — G35 Multiple sclerosis: Secondary | ICD-10-CM | POA: Diagnosis not present

## 2018-01-06 ENCOUNTER — Telehealth: Payer: 59 | Admitting: Family

## 2018-01-06 DIAGNOSIS — N390 Urinary tract infection, site not specified: Secondary | ICD-10-CM | POA: Diagnosis not present

## 2018-01-06 MED ORDER — CEPHALEXIN 500 MG PO CAPS
500.0000 mg | ORAL_CAPSULE | Freq: Two times a day (BID) | ORAL | 0 refills | Status: DC
Start: 2018-01-06 — End: 2018-06-17

## 2018-01-06 MED FILL — CEPHALEXIN 500 MG CAPSULE: 500 | 7 days supply | Qty: 14 | Fill #0

## 2018-01-06 NOTE — Progress Notes (Signed)
Thank you for the details you included in the comment boxes. Those details are very helpful in determining the best course of treatment for you and help [place] to provide the best care. As your situation is unique with your history and MS affecting you, we will treat you as below. However,if you do not improve in the next couple days, please contact the provider who treats you for MS.  We are sorry that you are not feeling well.  Here is how we plan to help!  Based on what you shared with me it looks like you most likely have a simple urinary tract infection.  A UTI (Urinary Tract Infection) is a bacterial infection of the bladder.  Most cases of urinary tract infections are simple to treat but a key part of your care is to encourage you to drink plenty of fluids and watch your symptoms carefully.  I have prescribed Keflex 500 mg twice a day for 7 days.  Your symptoms should gradually improve. Call us if the burning in your urine worsens, you develop worsening fever, back pain or pelvic pain or if your symptoms do not resolve after completing the antibiotic.  Urinary tract infections can be prevented by drinking plenty of water to keep your body hydrated.  Also be sure when you wipe, wipe from front to back and don't hold it in!  If possible, empty your bladder every 4 hours.  Your e-visit answers were reviewed by a board certified advanced clinical practitioner to complete your personal care plan.  Depending on the condition, your plan could have included both over the counter or prescription medications.  If there is a problem please reply  once you have received a response from your provider.  Your safety is important to [place].  If you have drug allergies check your prescription carefully.    You can use MyChart to ask questions about today's visit, request a non-urgent call back, or ask for a work or school excuse for 24 hours related to this e-Visit. If it has been greater than 24 hours you will  need to follow up with your provider, or enter a new e-Visit to address those concerns.   You will get an e-mail in the next two days asking about your experience.  I hope that your e-visit has been valuable and will speed your recovery. Thank you for using e-visits.

## 2018-01-13 DIAGNOSIS — G35 Multiple sclerosis: Secondary | ICD-10-CM | POA: Diagnosis not present

## 2018-01-17 ENCOUNTER — Telehealth: Payer: 59 | Admitting: Family

## 2018-01-17 DIAGNOSIS — B373 Candidiasis of vulva and vagina: Secondary | ICD-10-CM | POA: Diagnosis not present

## 2018-01-17 DIAGNOSIS — B3731 Acute candidiasis of vulva and vagina: Secondary | ICD-10-CM

## 2018-01-17 MED ORDER — FLUCONAZOLE 150 MG PO TABS: 150.0000 mg | ORAL_TABLET | Freq: Once | ORAL | 0 refills | Status: AC

## 2018-01-17 MED FILL — FLUCONAZOLE 150 MG TABLET: 150 | 1 days supply | Qty: 1 | Fill #0

## 2018-01-17 NOTE — Progress Notes (Signed)

## 2018-01-30 ENCOUNTER — Encounter: Payer: Self-pay | Admitting: Neurology

## 2018-01-30 ENCOUNTER — Ambulatory Visit: Payer: BLUE CROSS/BLUE SHIELD | Admitting: Neurology

## 2018-01-30 VITALS — BP 122/77 | HR 91 | Resp 18 | Ht 62.0 in | Wt 219.0 lb

## 2018-01-30 DIAGNOSIS — R350 Frequency of micturition: Secondary | ICD-10-CM

## 2018-01-30 DIAGNOSIS — G35 Multiple sclerosis: Secondary | ICD-10-CM | POA: Diagnosis not present

## 2018-01-30 DIAGNOSIS — Z79899 Other long term (current) drug therapy: Secondary | ICD-10-CM

## 2018-01-30 DIAGNOSIS — F411 Generalized anxiety disorder: Secondary | ICD-10-CM

## 2018-01-30 DIAGNOSIS — G4700 Insomnia, unspecified: Secondary | ICD-10-CM | POA: Diagnosis not present

## 2018-01-30 NOTE — Progress Notes (Signed)
[facility]  PATIENT: Tracey Lindsey DOB: 1991-08-07  REFERRING DOCTOR OR PCP:  Luan Pulling Jaci Lazier) SOURCE: Patient and ER records and PACS images  _________________________________   HISTORICAL  CHIEF COMPLAINT:  Chief Complaint  Patient presents with  . Multiple Sclerosis    Sts. she continuesto tolerate Tysabri well.  JCV ab last checked 07/24/17 and was negative at 0.11/fim    HISTORY OF PRESENT ILLNESS:  Tracey Lindsey is a 27 year old woman recently diagnosed with MS.      Update 01/30/2018: Her MS has done very well on Tysabri. She has not had any exacerbations. She tolerates the medication well. Her JCV antibody was last tested 07/24/2017. It was negative at 0.11 at that time..       She has urinary frequency but no incontinence.    Vision is fine.     She has some fatigue but is able to do everthing that she wants to do.    She denies depression but notes generalized anxiety.   There are no panic attacks.   She takes lorazepam just 2 x a month on average.    She has some insomnia, also has some nocturia, twice most nights. She denies any difficulty with her cognition.  Her headaches are doing better.   She took imipramine.   HA improved but not sure if due to imipramine..   Imipramine helped her sleep and nocturia. She stopped after a few weeks.      From 07/24/2017:  MS:  She is on Tysabri and tolerates it well.  She is JCV Ab negative (0.08  on 01/24/2017)   She denies any new exacerbation.   Cervical spine and brain November 2017 was personally reviewed. There were no new lesions. The previously enhancing lesions were smaller and no longer enhanced.  Headaches:   She is still getting frequent headaches. (>25/30 a month).   Pain is bilateral and is usually mild to moderate.   It builds up daily at work.  HA is sometimes present when she wakes up though.  She gets a pressure sensation.  HA is usually milder the next morning.   It only seems to  occur when she tries to go to sleep.  No nausea or vomiting.   Moving does not alter the pain much.   She denies occipital pain.    She notes pain in the back of the head/upper neck as well.   She does not take anything for the headache.  She is not sure if imipramine helped any when she tried in the past.  Gait/strength/sensation:   Her gait is back to baseline. She denies any weakness or spasticity in the arms or legs. There is no numbness.   Bladder/bowel:  She has urinary frequency only at night.    She has 3-4 times nocturia many nights.   No incontinence.  No recent UTI's.   No hesitancy.   Bowel is fine  Vision/ears:  Diplopia resolved. She denies any history of optic neuritis. She does not have any visual acuity issues or problems with color vision..  Fatigue/sleep:   She reports mild fatigue. This is worse with heat. She is working full-time.      She wakes up some at night but usually falls back asleep.   he is sleeping worse with frequent awakenings for nocturia.     Imipramine helped but caused a hangover.     Mood/Cognition:   She notes some anxiety but no  depression.   She denies any significant cognitive change.   MS History:   In retrospect, she had an episode of vertigo in 2014 with mild vision changes that improved.    In March 2016, she had vertigo that improved after a couple weeks.  In late April, she had  numbness in the left foot. When she woke up the next morning, she noted that the numbness was higher up the left arm and the next day it also involved the left hand and she went to the emergency room. In the emergency room, she had an MRI of the brain that showed white matter foci were some for multiple sclerosis.   MRI of the brain with and without contrast 03/29/15 showed several posterior fossa lesions with one in the right middle cerebellar peduncle and several in the pons.   The middle cerebellar peduncle focus and 2 of the pontine foci enhanced. There are also several  periventricular and deep white matter foci in the cerebral hemispheres.   MRI cervical spine shows one enhancing focus.  Repeat imaging of the brain and cervical spine November 2017 did not show any new lesions. The previously enhancing lesions were smaller and did not enhance.Marland Kitchen       REVIEW OF SYSTEMS: Constitutional: No fevers, chills, sweats, or change in appetite Eyes: No visual changes, double vision, eye pain Ear, nose and throat: No hearing loss, ear pain, nasal congestion, sore throat Cardiovascular: No chest pain, palpitations Respiratory: No shortness of breath at rest or with exertion.   No wheezes GastrointestinaI: No nausea, vomiting, diarrhea, abdominal pain, fecal incontinence Genitourinary: Notes frequency, nocturia (x2) Musculoskeletal: No neck pain, back pain Integumentary: No rash, pruritus, skin lesions Neurological: as above Psychiatric: as above  Endocrine: No palpitations, diaphoresis, change in appetite, change in weigh or increased thirst Hematologic/Lymphatic: No anemia, purpura, petechiae. Allergic/Immunologic: No itchy/runny eyes, nasal congestion, recent allergic reactions, rashes  ALLERGIES: No Known Allergies  HOME MEDICATIONS:  Current Outpatient Medications:  .  cholecalciferol (VITAMIN D) 1000 units tablet, Take 1,000 Units by mouth daily., Disp: , Rfl:  .  loratadine (CLARITIN) 10 MG tablet, Take 10 mg by mouth daily as needed. , Disp: , Rfl:  .  LORazepam (ATIVAN) 0.5 MG tablet, Take 1 tablet (0.5 mg total) by mouth 2 (two) times daily as needed for anxiety., Disp: 15 tablet, Rfl: 0 .  natalizumab (TYSABRI) 300 MG/15ML injection, Inject into the vein., Disp: , Rfl:  .  Norgestimate-Ethinyl Estradiol Triphasic (ORTHO TRI-CYCLEN LO) 0.18/0.215/0.25 MG-25 MCG tab, Take 1 tablet by mouth daily., Disp: 1 Package, Rfl: 11 .  TURMERIC PO, Take by mouth., Disp: , Rfl:  .  amoxicillin (AMOXIL) 250 MG capsule, Take 1 capsule (250 mg total) by mouth 3  (three) times daily., Disp: 9 capsule, Rfl: 0 .  azithromycin (ZITHROMAX Z-PAK) 250 MG tablet, As directed, Disp: 6 tablet, Rfl: 0 .  benzonatate (TESSALON PERLES) 100 MG capsule, Take 1 capsule (100 mg total) by mouth 3 (three) times daily as needed for cough., Disp: 20 capsule, Rfl: 0 .  cephALEXin (KEFLEX) 500 MG capsule, Take 1 capsule (500 mg total) by mouth 2 (two) times daily., Disp: 14 capsule, Rfl: 0 No current facility-administered medications for this visit.   Facility-Administered Medications Ordered in Other Visits:  .  gadopentetate dimeglumine (MAGNEVIST) injection 18 mL, 18 mL, Intravenous, Once PRN, Destynee Stringfellow, Pearletha Furl, MD  PAST MEDICAL HISTORY: Past Medical History:  Diagnosis Date  . MS (multiple sclerosis) [facility]) April 2016  PAST SURGICAL HISTORY: History reviewed. No pertinent surgical history.  FAMILY HISTORY: Family History  Problem Relation Age of Onset  . Diabetes Father   . Hypertension Father   . Stroke Father   . Cancer Mother        lung  . Hyperlipidemia Mother   . Lung disease Mother     SOCIAL HISTORY:  Social History   Socioeconomic History  . Marital status: Single    Spouse name: Not on file  . Number of children: Not on file  . Years of education: Not on file  . Highest education level: Not on file  Social Needs  . Financial resource strain: Not on file  . Food insecurity - worry: Not on file  . Food insecurity - inability: Not on file  . Transportation needs - medical: Not on file  . Transportation needs - non-medical: Not on file  Occupational History  . Occupation: unemployeed   Tobacco Use  . Smoking status: Never Smoker  . Smokeless tobacco: Never Used  Substance and Sexual Activity  . Alcohol use: No  . Drug use: No  . Sexual activity: Yes    Partners: Male    Birth control/protection: Pill  Other Topics Concern  . Not on file  Social History Narrative   Lives with mother    Drinks soda daily (8-10 ounces)      PHYSICAL EXAM  Vitals:   01/30/18 1557  BP: 122/77  Pulse: 91  Resp: 18  Weight: 219 lb (99.3 kg)  Height: 5\' 2"  (1.575 m)    Body mass index is 40.06 kg/m.   General: The patient is well-developed and well-nourished and in no acute distress  Skin: Extremities are without significant edema.  Musculoskeletal:  Back is nontender  Neurologic Exam  Mental status: The patient is alert and oriented x 3 at the time of the examination. The patient has apparent normal recent and remote memory, with an apparently normal attention span and concentration ability.   Speech is normal.  Cranial nerves:   Extraocular muscles are intact. Facial strength and sensation normal. Trapezius strength is strong.. No obvious hearing deficits are noted.  Motor:  Muscle bulk is normal.   Muscle tone is normal. Strength seemed to be 5/5 in the arms but there was a little drift on the left..   Sensory:   She has normal touch and vibration sensation..  Coordination: Cerebellar testing reveals good finger-nose-finger on the right but reduced on left.     Gait and station: Station is normal.   Gait is normal. Tandem gait is very mildly wide. Romberg is negative now.   Reflexes: Deep tendon reflexes are symmetric and increased at the knees with spread. There was no ankle clonus. Reflexes were plus in the arms  .    DIAGNOSTIC DATA (LABS, IMAGING, TESTING) - I reviewed patient records, labs, notes, testing and imaging myself where available.  Lab Results  Component Value Date   WBC 8.7 07/24/2017   HGB 12.9 07/24/2017   HCT 36.9 07/24/2017   MCV 87 07/24/2017   PLT 250 07/24/2017      Component Value Date/Time   NA 138 11/14/2015 1417   K 3.8 11/14/2015 1417   CL 101 11/14/2015 1417   CO2 24 11/14/2015 1417   GLUCOSE 73 11/14/2015 1417   GLUCOSE 94 03/31/2015 1015   BUN 9 11/14/2015 1417   CREATININE 0.63 11/14/2015 1417   CALCIUM 9.3 11/14/2015 1417   PROT 6.7 11/14/2015  1417    ALBUMIN 4.6 11/14/2015 1417   AST 17 11/14/2015 1417   ALT 13 11/14/2015 1417   ALKPHOS 48 11/14/2015 1417   BILITOT 0.5 11/14/2015 1417   GFRNONAA 126 11/14/2015 1417   GFRAA 145 11/14/2015 1417   No results found.    ASSESSMENT AND PLAN  MS (multiple sclerosis) (HCC)  Anxiety state  High risk medication use  Urinary frequency  Insomnia, unspecified type   1.   Continue Tysabri. I will check a CBC and JCV antibody today. Later this year we will need to check an MRI of the brain to determine if she is having any subclinical progression. If present, we will need to consider a different disease modifying therapy. 2.   If anxiety worsens, consider BuSpar or an SSRI.  3.  Stay active and exercise as tolerated.  4.    She will return to see me in 6 months for regular visits and call sooner if she has new or worsening symptoms. She will continue to come in monthly for the IV infusions.  Maryalyce Sanjuan A. Epimenio Foot, MD, PhD 01/30/2018, 4:10 PM Certified in Neurology, Clinical Neurophysiology, Sleep Medicine, Pain Medicine and Neuroimaging  [facility] [address], [address] [place], [place] [address] [phone]

## 2018-01-31 LAB — CBC WITH DIFFERENTIAL/PLATELET
BASOS ABS: 0 10*3/uL (ref 0.0–0.2)
BASOS: 0 %
EOS (ABSOLUTE): 0.2 10*3/uL (ref 0.0–0.4)
EOS: 2 %
HEMATOCRIT: 38.2 % (ref 34.0–46.6)
HEMOGLOBIN: 13 g/dL (ref 11.1–15.9)
IMMATURE GRANS (ABS): 0 10*3/uL (ref 0.0–0.1)
Immature Granulocytes: 0 %
LYMPHS ABS: 4 10*3/uL — AB (ref 0.7–3.1)
LYMPHS: 36 %
MCH: 30.6 pg (ref 26.6–33.0)
MCHC: 34 g/dL (ref 31.5–35.7)
MCV: 90 fL (ref 79–97)
MONOCYTES: 5 %
Monocytes Absolute: 0.5 10*3/uL (ref 0.1–0.9)
NEUTROS ABS: 6.3 10*3/uL (ref 1.4–7.0)
Neutrophils: 57 %
Platelets: 256 10*3/uL (ref 150–379)
RBC: 4.25 x10E6/uL (ref 3.77–5.28)
RDW: 13.6 % (ref 12.3–15.4)
WBC: 11.1 10*3/uL — ABNORMAL HIGH (ref 3.4–10.8)

## 2018-02-07 ENCOUNTER — Encounter: Payer: Self-pay | Admitting: *Deleted

## 2018-02-17 DIAGNOSIS — G35 Multiple sclerosis: Secondary | ICD-10-CM | POA: Diagnosis not present

## 2018-03-24 DIAGNOSIS — G35 Multiple sclerosis: Secondary | ICD-10-CM | POA: Diagnosis not present

## 2018-04-11 ENCOUNTER — Telehealth: Payer: 59 | Admitting: Family

## 2018-04-11 DIAGNOSIS — B373 Candidiasis of vulva and vagina: Secondary | ICD-10-CM

## 2018-04-11 DIAGNOSIS — B3731 Acute candidiasis of vulva and vagina: Secondary | ICD-10-CM

## 2018-04-11 MED ORDER — FLUCONAZOLE 150 MG PO TABS: 150.0000 mg | ORAL_TABLET | Freq: Once | ORAL | 0 refills | Status: AC

## 2018-04-11 MED FILL — FLUCONAZOLE 150 MG TABS: 150 | 1 days supply | Qty: 1 | Fill #0

## 2018-04-11 NOTE — Progress Notes (Signed)

## 2018-04-14 ENCOUNTER — Encounter: Payer: Self-pay | Admitting: *Deleted

## 2018-04-24 ENCOUNTER — Encounter: Payer: Self-pay | Admitting: Family Medicine

## 2018-04-24 ENCOUNTER — Ambulatory Visit (INDEPENDENT_AMBULATORY_CARE_PROVIDER_SITE_OTHER): Payer: 59 | Admitting: Family Medicine

## 2018-04-24 VITALS — BP 118/76 | HR 100 | Temp 98.0°F | Resp 14 | Ht 63.38 in | Wt 224.0 lb

## 2018-04-24 DIAGNOSIS — Z114 Encounter for screening for human immunodeficiency virus [HIV]: Secondary | ICD-10-CM | POA: Diagnosis not present

## 2018-04-24 DIAGNOSIS — Z6839 Body mass index (BMI) 39.0-39.9, adult: Secondary | ICD-10-CM | POA: Diagnosis not present

## 2018-04-24 DIAGNOSIS — E6609 Other obesity due to excess calories: Secondary | ICD-10-CM | POA: Diagnosis not present

## 2018-04-24 DIAGNOSIS — Z0000 Encounter for general adult medical examination without abnormal findings: Secondary | ICD-10-CM

## 2018-04-24 DIAGNOSIS — Z0184 Encounter for antibody response examination: Secondary | ICD-10-CM

## 2018-04-24 DIAGNOSIS — Z113 Encounter for screening for infections with a predominantly sexual mode of transmission: Secondary | ICD-10-CM | POA: Diagnosis not present

## 2018-04-24 DIAGNOSIS — G35 Multiple sclerosis: Secondary | ICD-10-CM | POA: Diagnosis not present

## 2018-04-24 NOTE — Assessment & Plan Note (Addendum)
Offered referral to nutritionist; she'll go to the gym and eat better; pointed out the acanthosis nigricans, marker of insulin resistance and could lead to diabetes down the road unless she loses weight; she will work on this

## 2018-04-24 NOTE — Assessment & Plan Note (Signed)
USPSTF grade A and B recommendations reviewed with patient; age-appropriate recommendations, preventive care, screening tests, etc discussed and encouraged; healthy living encouraged; see AVS for patient education given to patient  

## 2018-04-24 NOTE — Progress Notes (Signed)
Patient ID: Tracey Lindsey, female   DOB: 02-26-1991, 27 y.o.   MRN: [id number]   Subjective:   Tracey Lindsey is a 27 y.o. female here for a complete physical exam  Interim issues since last visit: she has been treated with zpak recently; still with some cough, taking zyrtec daily now; morning is the most, nose is stuffy and spits up some phlegm; during the day it's better  USPSTF grade A and B recommendations Depression:  Depression screen [facility] 2/9 04/24/2018 08/23/2017 03/01/2017 01/11/2017 11/05/2016  Decreased Interest 0 0 0 0 0  Down, Depressed, Hopeless 0 0 0 0 0  PHQ - 2 Score 0 0 0 0 0   Hypertension: BP Readings from Last 3 Encounters:  04/24/18 118/76  01/30/18 122/77  08/23/17 108/64   Obesity: Wt Readings from Last 3 Encounters:  04/24/18 224 lb (101.6 kg)  01/30/18 219 lb (99.3 kg)  08/23/17 201 lb 9.6 oz (91.4 kg)   BMI Readings from Last 3 Encounters:  04/24/18 39.21 kg/m  01/30/18 40.06 kg/m  08/23/17 36.87 kg/m    Skin cancer: no worrisome moles Lung cancer:  nonsmoker Breast cancer: no lumps or bumps Colorectal cancer: no fam hx of colon cancer Cervical cancer screening: UTD BRCA gene screening: family hx of breast and/or ovarian cancer and/or metastatic prostate cancer? no HIV, hep B, hep C: testing STD testing and prevention (chl/gon/syphilis): testing Intimate partner violence: no abuse Contraception: using condoms sometimes; started the OCP, but that caused [place] periods; older one worked fine but inusrance would not approve Osteoporosis: no hx of steroids Fall prevention/vitamin D: discussed, OTC not mega-doses Immunizations: will check MMR status Diet: room for improvement Exercise: has a gym membership, will stop making excuses, will make time Alcohol: not even weekly, holidays Tobacco use: nonsmoker AAA: n/a Aspirin:n/a Glucose: today Glucose  Date Value Ref Range Status  11/14/2015 73 65 - 99 mg/dL Final   Glucose, Bld  Date Value Ref  Range Status  03/31/2015 94 70 - 99 mg/dL Final  03/12/2015 119 (H) 70 - 99 mg/dL Final  03/11/2015 83 70 - 99 mg/dL Final   Lipids: check today Lab Results  Component Value Date   CHOL 175 11/14/2015   Lab Results  Component Value Date   HDL 52 11/14/2015   Lab Results  Component Value Date   LDLCALC 111 (H) 11/14/2015   Lab Results  Component Value Date   TRIG 61 11/14/2015   No results found for: CHOLHDL No results found for: LDLDIRECT   Past Medical History:  Diagnosis Date  . MS (multiple sclerosis) [facility]) April 2016   History reviewed. No pertinent surgical history. Family History  Problem Relation Age of Onset  . Diabetes Father   . Hypertension Father   . Stroke Father   . Cancer Mother        lung  . Hyperlipidemia Mother   . Lung disease Mother    Social History   Tobacco Use  . Smoking status: Never Smoker  . Smokeless tobacco: Never Used  Substance Use Topics  . Alcohol use: No  . Drug use: No   Review of Systems  Constitutional: Negative for fever and unexpected weight change.  HENT: Positive for postnasal drip and rhinorrhea.   Eyes: Negative for discharge and visual disturbance.  Respiratory: Negative for shortness of breath.   Cardiovascular: Negative for chest pain.  Gastrointestinal: Negative for blood in stool.  Endocrine: Negative for polydipsia.  Genitourinary: Negative for hematuria.  Musculoskeletal: Negative for arthralgias.  Skin:       No worrisome moles  Allergic/Immunologic: Negative for food allergies.  Neurological: Positive for tremors (from MS).       Able to perform everything she needs to perform  Hematological: Negative for adenopathy. Does not bruise/bleed easily.    Objective:   Vitals:   04/24/18 1107  BP: 118/76  Pulse: 100  Resp: 14  Temp: 98 F (36.7 C)  TempSrc: Oral  SpO2: 97%  Weight: 224 lb (101.6 kg)  Height: 5' 3.38" (1.61 m)   Body mass index is 39.21 kg/m. Wt Readings from Last 3  Encounters:  04/24/18 224 lb (101.6 kg)  01/30/18 219 lb (99.3 kg)  08/23/17 201 lb 9.6 oz (91.4 kg)   Physical Exam  Constitutional: She appears well-developed and well-nourished.  HENT:  Head: Normocephalic and atraumatic.  Right Ear: Hearing, tympanic membrane, external ear and ear canal normal.  Left Ear: Hearing, tympanic membrane, external ear and ear canal normal.  Eyes: Conjunctivae and EOM are normal. Right eye exhibits no hordeolum. Left eye exhibits no hordeolum. No scleral icterus.  Neck: Carotid bruit is not present. No thyromegaly present.  Cardiovascular: Normal rate, regular rhythm, S1 normal, S2 normal and normal heart sounds.  No extrasystoles are present.  Pulmonary/Chest: Effort normal and breath sounds normal. No respiratory distress. Right breast exhibits no inverted nipple, no mass, no nipple discharge, no skin change and no tenderness. Left breast exhibits no inverted nipple, no mass, no nipple discharge, no skin change and no tenderness. Breasts are symmetrical.  Abdominal: Soft. Normal appearance and bowel sounds are normal. She exhibits no distension, no abdominal bruit, no pulsatile midline mass and no mass. There is no hepatosplenomegaly. There is no tenderness. No hernia.  Musculoskeletal: Normal range of motion. She exhibits no edema.  Lymphadenopathy:       Head (right side): No submandibular adenopathy present.       Head (left side): No submandibular adenopathy present.    She has no cervical adenopathy.    She has no axillary adenopathy.  Neurological: She is alert. She displays no tremor. No cranial nerve deficit. She exhibits normal muscle tone. Gait normal.  Reflex Scores:      Patellar reflexes are 2+ on the right side and 2+ on the left side. Skin: Skin is warm and dry. No bruising and no ecchymosis noted. No cyanosis. No pallor.  Hyperpigmentation along the creases of the neck, c/w acanthosis nigricans  Psychiatric: Her speech is normal and behavior  is normal. Thought content normal. Her mood appears not anxious. She does not exhibit a depressed mood.    Assessment/Plan:   Problem List Items Addressed This Visit      Other   Screen for STD (sexually transmitted disease)   Relevant Orders   C. trachomatis/N. gonorrhoeae RNA   HIV antibody (with reflex)   Hepatitis, Acute   RPR   Preventative health care - Primary    USPSTF grade A and B recommendations reviewed with patient; age-appropriate recommendations, preventive care, screening tests, etc discussed and encouraged; healthy living encouraged; see AVS for patient education given to patient       Relevant Orders   CBC with Differential/Platelet   COMPLETE METABOLIC PANEL WITH GFR   Lipid panel   TSH   Obesity    Offered referral to nutritionist; she'll go to the gym and eat better; pointed out the acanthosis nigricans, marker of insulin resistance and could lead to diabetes  down the road unless she loses weight; she will work on this       Other Visit Diagnoses    Immunity status testing       check measles immunity status   Relevant Orders   Measles/Mumps/Rubella Immunity   Screening for HIV without presence of risk factors       screening HIV collected today; safe sex practices encouraged; knowledge is power   Relevant Orders   HIV antibody (with reflex)       No orders of the defined types were placed in this encounter.  Orders Placed This Encounter  Procedures  . C. trachomatis/N. gonorrhoeae RNA  . HIV antibody (with reflex)  . Hepatitis, Acute  . RPR  . Measles/Mumps/Rubella Immunity  . CBC with Differential/Platelet  . COMPLETE METABOLIC PANEL WITH GFR  . Lipid panel  . TSH    Follow up plan: Return in about 1 year (around 04/25/2019) for follow-up visit with Dr. Sanda Klein.  An After Visit Summary was printed and given to the patient.

## 2018-04-24 NOTE — Patient Instructions (Addendum)
Let's get labs today Check out the information at familydoctor.org entitled "Nutrition for Weight Loss: What You Need to Know about Fad Diets" Try to lose between 1-2 pounds per week by taking in fewer calories and burning off more calories You can succeed by limiting portions, limiting foods dense in calories and fat, becoming more active, and drinking 8 glasses of water a day (64 ounces) Don't skip meals, especially breakfast, as skipping meals may alter your metabolism Do not use over-the-counter weight loss pills or gimmicks that claim rapid weight loss A healthy BMI (or body mass index) is between 18.5 and 24.9 You can calculate your ideal BMI at the [employer] website [employer]Mon[employer]fr Health Maintenance, Female Adopting a healthy lifestyle and getting preventive care can go a Holdren way to promote health and wellness. Talk with your health care provider about what schedule of regular examinations is right for you. This is a good chance for you to check in with your provider about disease prevention and staying healthy. In between checkups, there are plenty of things you can do on your own. Experts have done a lot of research about which lifestyle changes and preventive measures are most likely to keep you healthy. Ask your health care provider for more information. Weight and diet Eat a healthy diet  Be sure to include plenty of vegetables, fruits, low-fat dairy products, and lean protein.  Do not eat a lot of foods high in solid fats, added sugars, or salt.  Get regular exercise. This is one of the most important things you can do for your health. ? Most adults should exercise for at least 150 minutes each week. The exercise should increase your heart rate and make you sweat (moderate-intensity exercise). ? Most adults should also do strengthening exercises at least twice a week. This is in addition to the moderate-intensity exercise.  Maintain a  healthy weight  Body mass index (BMI) is a measurement that can be used to identify possible weight problems. It estimates body fat based on height and weight. Your health care provider can help determine your BMI and help you achieve or maintain a healthy weight.  For females 1 years of age and older: ? A BMI below 18.5 is considered underweight. ? A BMI of 18.5 to 24.9 is normal. ? A BMI of 25 to 29.9 is considered overweight. ? A BMI of 30 and above is considered obese.  Watch levels of cholesterol and blood lipids  You should start having your blood tested for lipids and cholesterol at 27 years of age, then have this test every 5 years.  You may need to have your cholesterol levels checked more often if: ? Your lipid or cholesterol levels are high. ? You are older than 27 years of age. ? You are at high risk for heart disease.  Cancer screening Lung Cancer  Lung cancer screening is recommended for adults 62-17 years old who are at high risk for lung cancer because of a history of smoking.  A yearly low-dose CT scan of the lungs is recommended for people who: ? Currently smoke. ? Have quit within the past 15 years. ? Have at least a 30-pack-year history of smoking. A pack year is smoking an average of one pack of cigarettes a day for 1 year.  Yearly screening should continue until it has been 15 years since you quit.  Yearly screening should stop if you develop a health problem that would prevent you from having lung cancer treatment.  Breast Cancer  Practice breast self-awareness. This means understanding how your breasts normally appear and feel.  It also means doing regular breast self-exams. Let your health care provider know about any changes, no matter how small.  If you are in your 20s or 30s, you should have a clinical breast exam (CBE) by a health care provider every 1-3 years as part of a regular health exam.  If you are 70 or older, have a CBE every year. Also  consider having a breast X-ray (mammogram) every year.  If you have a family history of breast cancer, talk to your health care provider about genetic screening.  If you are at high risk for breast cancer, talk to your health care provider about having an MRI and a mammogram every year.  Breast cancer gene (BRCA) assessment is recommended for women who have family members with BRCA-related cancers. BRCA-related cancers include: ? Breast. ? Ovarian. ? Tubal. ? Peritoneal cancers.  Results of the assessment will determine the need for genetic counseling and BRCA1 and BRCA2 testing.  Cervical Cancer Your health care provider may recommend that you be screened regularly for cancer of the pelvic organs (ovaries, uterus, and vagina). This screening involves a pelvic examination, including checking for microscopic changes to the surface of your cervix (Pap test). You may be encouraged to have this screening done every 3 years, beginning at age 39.  For women ages 24-65, health care providers may recommend pelvic exams and Pap testing every 3 years, or they may recommend the Pap and pelvic exam, combined with testing for human papilloma virus (HPV), every 5 years. Some types of HPV increase your risk of cervical cancer. Testing for HPV may also be done on women of any age with unclear Pap test results.  Other health care providers may not recommend any screening for nonpregnant women who are considered low risk for pelvic cancer and who do not have symptoms. Ask your health care provider if a screening pelvic exam is right for you.  If you have had past treatment for cervical cancer or a condition that could lead to cancer, you need Pap tests and screening for cancer for at least 20 years after your treatment. If Pap tests have been discontinued, your risk factors (such as having a new sexual partner) need to be reassessed to determine if screening should resume. Some women have medical problems that  increase the chance of getting cervical cancer. In these cases, your health care provider may recommend more frequent screening and Pap tests.  Colorectal Cancer  This type of cancer can be detected and often prevented.  Routine colorectal cancer screening usually begins at 27 years of age and continues through 27 years of age.  Your health care provider may recommend screening at an earlier age if you have risk factors for colon cancer.  Your health care provider may also recommend using home test kits to check for hidden blood in the stool.  A small camera at the end of a tube can be used to examine your colon directly (sigmoidoscopy or colonoscopy). This is done to check for the earliest forms of colorectal cancer.  Routine screening usually begins at age 32.  Direct examination of the colon should be repeated every 5-10 years through 27 years of age. However, you may need to be screened more often if early forms of precancerous polyps or small growths are found.  Skin Cancer  Check your skin from head to toe regularly.  Tell  your health care provider about any new moles or changes in moles, especially if there is a change in a mole's shape or color.  Also tell your health care provider if you have a mole that is larger than the size of a pencil eraser.  Always use sunscreen. Apply sunscreen liberally and repeatedly throughout the day.  Protect yourself by wearing Yuhasz sleeves, pants, a wide-brimmed hat, and sunglasses whenever you are outside.  Heart disease, diabetes, and high blood pressure  High blood pressure causes heart disease and increases the risk of stroke. High blood pressure is more likely to develop in: ? People who have blood pressure in the high end of the normal range (130-139/85-89 mm Hg). ? People who are overweight or obese. ? People who are African American.  If you are 72-32 years of age, have your blood pressure checked every 3-5 years. If you are 43  years of age or older, have your blood pressure checked every year. You should have your blood pressure measured twice-once when you are at a hospital or clinic, and once when you are not at a hospital or clinic. Record the average of the two measurements. To check your blood pressure when you are not at a hospital or clinic, you can use: ? An automated blood pressure machine at a pharmacy. ? A home blood pressure monitor.  If you are between 41 years and 14 years old, ask your health care provider if you should take aspirin to prevent strokes.  Have regular diabetes screenings. This involves taking a blood sample to check your fasting blood sugar level. ? If you are at a normal weight and have a low risk for diabetes, have this test once every three years after 27 years of age. ? If you are overweight and have a high risk for diabetes, consider being tested at a younger age or more often. Preventing infection Hepatitis B  If you have a higher risk for hepatitis B, you should be screened for this virus. You are considered at high risk for hepatitis B if: ? You were born in a country where hepatitis B is common. Ask your health care provider which countries are considered high risk. ? Your parents were born in a high-risk country, and you have not been immunized against hepatitis B (hepatitis B vaccine). ? You have HIV or AIDS. ? You use needles to inject street drugs. ? You live with someone who has hepatitis B. ? You have had sex with someone who has hepatitis B. ? You get hemodialysis treatment. ? You take certain medicines for conditions, including cancer, organ transplantation, and autoimmune conditions.  Hepatitis C  Blood testing is recommended for: ? Everyone born from [birth year] through 1965. ? Anyone with known risk factors for hepatitis C.  Sexually transmitted infections (STIs)  You should be screened for sexually transmitted infections (STIs) including gonorrhea and chlamydia  if: ? You are sexually active and are younger than 27 years of age. ? You are older than 27 years of age and your health care provider tells you that you are at risk for this type of infection. ? Your sexual activity has changed since you were last screened and you are at an increased risk for chlamydia or gonorrhea. Ask your health care provider if you are at risk.  If you do not have HIV, but are at risk, it may be recommended that you take a prescription medicine daily to prevent HIV infection. This is called pre-exposure  prophylaxis (PrEP). You are considered at risk if: ? You are sexually active and do not regularly use condoms or know the HIV status of your partner(s). ? You take drugs by injection. ? You are sexually active with a partner who has HIV.  Talk with your health care provider about whether you are at high risk of being infected with HIV. If you choose to begin PrEP, you should first be tested for HIV. You should then be tested every 3 months for as Diclemente as you are taking PrEP. Pregnancy  If you are premenopausal and you may become pregnant, ask your health care provider about preconception counseling.  If you may become pregnant, take 400 to 800 micrograms (mcg) of folic acid every day.  If you want to prevent pregnancy, talk to your health care provider about birth control (contraception). Osteoporosis and menopause  Osteoporosis is a disease in which the bones lose minerals and strength with aging. This can result in serious bone fractures. Your risk for osteoporosis can be identified using a bone density scan.  If you are 48 years of age or older, or if you are at risk for osteoporosis and fractures, ask your health care provider if you should be screened.  Ask your health care provider whether you should take a calcium or vitamin D supplement to lower your risk for osteoporosis.  Menopause may have certain physical symptoms and risks.  Hormone replacement therapy  may reduce some of these symptoms and risks. Talk to your health care provider about whether hormone replacement therapy is right for you. Follow these instructions at home:  Schedule regular health, dental, and eye exams.  Stay current with your immunizations.  Do not use any tobacco products including cigarettes, chewing tobacco, or electronic cigarettes.  If you are pregnant, do not drink alcohol.  If you are breastfeeding, limit how much and how often you drink alcohol.  Limit alcohol intake to no more than 1 drink per day for nonpregnant women. One drink equals 12 ounces of beer, 5 ounces of wine, or 1 ounces of hard liquor.  Do not use street drugs.  Do not share needles.  Ask your health care provider for help if you need support or information about quitting drugs.  Tell your health care provider if you often feel depressed.  Tell your health care provider if you have ever been abused or do not feel safe at home. This information is not intended to replace advice given to you by your health care provider. Make sure you discuss any questions you have with your health care provider. Document Released: 06/04/2011 Document Revised: 04/26/2016 Document Reviewed: 08/23/2015 Elsevier Interactive Patient Education  [employer].

## 2018-04-25 LAB — LIPID PANEL
CHOL/HDL RATIO: 4.1 (calc) (ref <5.0)
Cholesterol: 200 mg/dL — ABNORMAL HIGH (ref <200)
HDL: 49 mg/dL — AB (ref >50)
LDL Cholesterol (Calc): 131 mg/dL (calc) — ABNORMAL HIGH
NON-HDL CHOLESTEROL (CALC): 151 mg/dL — AB (ref <130)
TRIGLYCERIDES: 97 mg/dL (ref <150)

## 2018-04-25 LAB — COMPLETE METABOLIC PANEL WITH GFR
AG Ratio: 1.9 (calc) (ref 1.0–2.5)
ALBUMIN MSPROF: 4.4 g/dL (ref 3.6–5.1)
ALKALINE PHOSPHATASE (APISO): 50 U/L (ref 33–115)
ALT: 15 U/L (ref 6–29)
AST: 17 U/L (ref 10–30)
BUN: 11 mg/dL (ref 7–25)
CALCIUM: 9.7 mg/dL (ref 8.6–10.2)
CO2: 27 mmol/L (ref 20–32)
CREATININE: 0.75 mg/dL (ref 0.50–1.10)
Chloride: 105 mmol/L (ref 98–110)
GFR, EST AFRICAN AMERICAN: 127 mL/min/{1.73_m2} (ref >=60)
GFR, EST NON AFRICAN AMERICAN: 110 mL/min/{1.73_m2} (ref >=60)
GLOBULIN: 2.3 g/dL (ref 1.9–3.7)
GLUCOSE: 87 mg/dL (ref 65–139)
Potassium: 3.6 mmol/L (ref 3.5–5.3)
SODIUM: 141 mmol/L (ref 135–146)
TOTAL PROTEIN: 6.7 g/dL (ref 6.1–8.1)
Total Bilirubin: 0.5 mg/dL (ref 0.2–1.2)

## 2018-04-25 LAB — MEASLES/MUMPS/RUBELLA IMMUNITY
Mumps IgG: 64 AU/mL
Rubella: 9.15 index

## 2018-04-25 LAB — CBC WITH DIFFERENTIAL/PLATELET
BASOS PCT: 0.8 %
Basophils Absolute: 67 cells/uL (ref 0–200)
EOS PCT: 2.1 %
Eosinophils Absolute: 176 cells/uL (ref 15–500)
HEMATOCRIT: 40.9 % (ref 35.0–45.0)
HEMOGLOBIN: 14 g/dL (ref 11.7–15.5)
LYMPHS ABS: 3049 {cells}/uL (ref 850–3900)
MCH: 30.4 pg (ref 27.0–33.0)
MCHC: 34.2 g/dL (ref 32.0–36.0)
MCV: 88.9 fL (ref 80.0–100.0)
MPV: 10.6 fL (ref 7.5–12.5)
Monocytes Relative: 6.3 %
NEUTROS ABS: 4578 {cells}/uL (ref 1500–7800)
NEUTROS PCT: 54.5 %
Platelets: 246 10*3/uL (ref 140–400)
RBC: 4.6 10*6/uL (ref 3.80–5.10)
RDW: 12.5 % (ref 11.0–15.0)
Total Lymphocyte: 36.3 %
WBC: 8.4 10*3/uL (ref 3.8–10.8)
WBCMIX: 529 {cells}/uL (ref 200–950)

## 2018-04-25 LAB — HEPATITIS PANEL, ACUTE
HEP B S AG: NONREACTIVE
HEP C AB: NONREACTIVE
Hep A IgM: NONREACTIVE
Hep B C IgM: NONREACTIVE
SIGNAL TO CUT-OFF: 0.01 (ref <1.00)

## 2018-04-25 LAB — RPR: RPR Ser Ql: NONREACTIVE

## 2018-04-25 LAB — C. TRACHOMATIS/N. GONORRHOEAE RNA
C. trachomatis RNA, TMA: NOT DETECTED
N. gonorrhoeae RNA, TMA: NOT DETECTED

## 2018-04-25 LAB — TSH: TSH: 1.29 m[IU]/L

## 2018-04-25 LAB — HIV ANTIBODY (ROUTINE TESTING W REFLEX): HIV: NONREACTIVE

## 2018-05-23 ENCOUNTER — Telehealth: Payer: 59 | Admitting: Family Medicine

## 2018-05-23 DIAGNOSIS — N760 Acute vaginitis: Secondary | ICD-10-CM

## 2018-05-23 DIAGNOSIS — N898 Other specified noninflammatory disorders of vagina: Secondary | ICD-10-CM

## 2018-05-23 MED ORDER — FLUCONAZOLE 150 MG PO TABS: 150.0000 mg | ORAL_TABLET | Freq: Once | ORAL | 0 refills | Status: AC

## 2018-05-23 MED FILL — FLUCONAZOLE 150 MG TABS: 150 | 1 days supply | Qty: 1 | Fill #0

## 2018-05-23 NOTE — Progress Notes (Signed)
We are sorry that you are not feeling well. Here is how we plan to help! Based on what you shared with me it looks like you: May have a yeast vaginosis   BASED ON CHART REVIEW THIS APPEARS TO BE YOUR THIRD YEAST INFECTION IN THE LAST 6 MONTHS- POSSIBLY INITIATING CONTACT WITH YOUR GYN IF THIS OCCURS AGAIN IS THE NEXT BEST COURSE OF ACTION. I HAVE TREATED YOU FOR THIS CONDITION TODAY BUT FEEL IT IS MEDICALLY PRUDENT FOR FACE TO FACE EVALUATION IF SIMILAR SYMPTOMS PRESENT IN THE NEXT 90 DAYS. THANKS. PLEASE FEEL BETTER AND SEEK FACE TO FACE CARE IF SYMPTOMS NOT IMPROVED.  Vaginosis is an inflammation of the vagina that can result in discharge, itching and pain. The cause is usually a change in the normal balance of vaginal bacteria or an infection. Vaginosis can also result from reduced estrogen levels after menopause.  The most common causes of vaginosis are:   Bacterial vaginosis which results from an overgrowth of one on several organisms that are normally present in your vagina.   Yeast infections which are caused by a naturally occurring fungus called candida.   Vaginal atrophy (atrophic vaginosis) which results from the thinning of the vagina from reduced estrogen levels after menopause.   Trichomoniasis which is caused by a parasite and is commonly transmitted by sexual intercourse.  Factors that increase your risk of developing vaginosis include: Marland Kitchen Medications, such as antibiotics and steroids . Uncontrolled diabetes . Use of hygiene products such as bubble bath, vaginal spray or vaginal deodorant . Douching . Wearing damp or tight-fitting clothing . Using an intrauterine device (IUD) for birth control . Hormonal changes, such as those associated with pregnancy, birth control pills or menopause . Sexual activity . Having a sexually transmitted infection  Your treatment plan is A single Diflucan (fluconazole) 150mg  tablet once.  I have electronically sent this prescription into the  pharmacy that you have chosen.  Be sure to take all of the medication as directed. Stop taking any medication if you develop a rash, tongue swelling or shortness of breath. Mothers who are breast feeding should consider pumping and discarding their breast milk while on these antibiotics. However, there is no consensus that infant exposure at these doses would be harmful.  Remember that medication creams can weaken latex condoms. Marland Kitchen   HOME CARE:  Good hygiene may prevent some types of vaginosis from recurring and may relieve some symptoms:  . Avoid baths, hot tubs and whirlpool spas. Rinse soap from your outer genital area after a shower, and dry the area well to prevent irritation. Don't use scented or harsh soaps, such as those with deodorant or antibacterial action. Marland Kitchen Avoid irritants. These include scented tampons and pads. . Wipe from front to back after using the toilet. Doing so avoids spreading fecal bacteria to your vagina.  Other things that may help prevent vaginosis include:  Marland Kitchen Don't douche. Your vagina doesn't require cleansing other than normal bathing. Repetitive douching disrupts the normal organisms that reside in the vagina and can actually increase your risk of vaginal infection. Douching won't clear up a vaginal infection. . Use a latex condom. Both female and female latex condoms may help you avoid infections spread by sexual contact. . Wear cotton underwear. Also wear pantyhose with a cotton crotch. If you feel comfortable without it, skip wearing underwear to bed. Yeast thrives in [employer] Your symptoms should improve in the next day or two.  GET HELP RIGHT AWAY IF:  .  You have pain in your lower abdomen ( pelvic area or over your ovaries) . You develop nausea or vomiting . You develop a fever . Your discharge changes or worsens . You have persistent pain with intercourse . You develop shortness of breath, a rapid pulse, or you faint.  These symptoms could  be signs of problems or infections that need to be evaluated by a medical provider now.  MAKE SURE YOU    Understand these instructions.  Will watch your condition.  Will get help right away if you are not doing well or get worse.  Your e-visit answers were reviewed by a board certified advanced clinical practitioner to complete your personal care plan. Depending upon the condition, your plan could have included both over the counter or prescription medications. Please review your pharmacy choice to make sure that you have choses a pharmacy that is open for you to pick up any needed prescription, Your safety is important to [place]. If you have drug allergies check your prescription carefully.   You can use MyChart to ask questions about today's visit, request a non-urgent call back, or ask for a work or school excuse for 24 hours related to this e-Visit. If it has been greater than 24 hours you will need to follow up with your provider, or enter a new e-Visit to address those concerns. You will get a MyChart message within the next two days asking about your experience. I hope that your e-visit has been valuable and will speed your recovery.

## 2018-05-29 DIAGNOSIS — G35 Multiple sclerosis: Secondary | ICD-10-CM | POA: Diagnosis not present

## 2018-06-11 DIAGNOSIS — F4320 Adjustment disorder, unspecified: Secondary | ICD-10-CM | POA: Diagnosis not present

## 2018-06-16 ENCOUNTER — Telehealth: Payer: Self-pay | Admitting: Family Medicine

## 2018-06-16 NOTE — Telephone Encounter (Signed)
Copied from CRM [id number]. Topic: Quick Communication - See Telephone Encounter >> Jun 16, 2018  4:53 PM Raquel Sarna wrote: Pt thinks she's getting another yeast infection. Please call pr back.

## 2018-06-17 ENCOUNTER — Ambulatory Visit (INDEPENDENT_AMBULATORY_CARE_PROVIDER_SITE_OTHER): Payer: 59 | Admitting: Nurse Practitioner

## 2018-06-17 ENCOUNTER — Encounter: Payer: Self-pay | Admitting: Nurse Practitioner

## 2018-06-17 ENCOUNTER — Other Ambulatory Visit (HOSPITAL_COMMUNITY)
Admission: RE | Admit: 2018-06-17 | Discharge: 2018-06-17 | Disposition: A | Discharge status: Home / Self Care | Payer: 59 | Source: Ambulatory Visit | Attending: Nurse Practitioner | Admitting: Nurse Practitioner

## 2018-06-17 VITALS — BP 110/66 | HR 90 | Temp 98.4°F | Resp 12 | Ht 63.0 in | Wt 227.5 lb

## 2018-06-17 DIAGNOSIS — B373 Candidiasis of vulva and vagina: Secondary | ICD-10-CM

## 2018-06-17 DIAGNOSIS — N898 Other specified noninflammatory disorders of vagina: Secondary | ICD-10-CM | POA: Insufficient documentation

## 2018-06-17 DIAGNOSIS — N926 Irregular menstruation, unspecified: Secondary | ICD-10-CM | POA: Diagnosis not present

## 2018-06-17 DIAGNOSIS — B3731 Acute candidiasis of vulva and vagina: Secondary | ICD-10-CM

## 2018-06-17 NOTE — Progress Notes (Addendum)
c traName: Tracey Lindsey   MRN: [id number]    DOB: 05/09/1991   Date:06/17/2018       Progress Note  Subjective  Chief Complaint  Chief Complaint  Patient presents with  . Vaginitis    HPI  Patient endorses 2-3 days of vaginal itching and thick white discharge without any foul odor.  No recent Antibiotics, glucocorticoids. No history diabetes mellitus, HIV infection. Doesn't have IUD, or use ocp. Using condoms every time. Has had about 3 yeast infections in the last year. Since it is been hotter so has wearing loose clothing, keeping groin clean and dry. Doesn't douche. Blood sugar at recent physical in may was 84.   Denies dysuria, no rashes or burning or pain, dyspareunia.  Does note that she has irregular periods- last periods was in may. She was switched on oral contraceptives in the past due to insurance coverage but didn't like side effects is currently only using condoms every time. Did take a pregnancy test at home even though she has been very careful with using condoms due to missed period and it was negative. Would not like one today. Discussed restarting ocp- states is unsure; does not want IUD, implant or injection.   Patient Active Problem List   Diagnosis Date Noted  . Screen for STD (sexually transmitted disease) 03/01/2017  . Encounter for screening for HIV 03/01/2017  . High risk medication use 01/24/2017  . Other headache syndrome 07/24/2016  . Obesity 11/19/2015  . Preventative health care 11/14/2015  . Oral contraceptive prescribed 06/08/2015  . Verruca vulgaris 06/08/2015  . Depression with anxiety 05/26/2015  . Weakness of left arm 05/26/2015  . Vertigo 04/04/2015  . Numbness 04/04/2015  . Vitamin D deficiency 04/04/2015  . 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 Anxiety state 04/04/2015  . Insomnia 04/04/2015  . MS (multiple sclerosis) (HCC) 03/04/2015    Past Medical History:  Diagnosis Date  . MS (multiple sclerosis) [facility] April 2016    History reviewed. No  pertinent surgical history.  Social History   Tobacco Use  . Smoking status: Never Smoker  . Smokeless tobacco: Never Used  Substance Use Topics  . Alcohol use: No     Current Outpatient Medications:  .  cetirizine (ZYRTEC) 10 MG tablet, Take 10 mg by mouth daily., Disp: , Rfl:  .  cholecalciferol (VITAMIN D) 1000 units tablet, Take 1,000 Units by mouth daily., Disp: , Rfl:  .  fluconazole (DIFLUCAN) 150 MG tablet, Take 1 tablet by mouth once., Disp: , Rfl: 0 .  loratadine (CLARITIN) 10 MG tablet, Take 10 mg by mouth daily as needed. , Disp: , Rfl:  .  LORazepam (ATIVAN) 0.5 MG tablet, Take 1 tablet (0.5 mg total) by mouth 2 (two) times daily as needed for anxiety., Disp: 15 tablet, Rfl: 0 .  natalizumab (TYSABRI) 300 MG/15ML injection, Inject 15 mLs into the vein every 30 (thirty) days. , Disp: , Rfl:  .  Norgestimate-Ethinyl Estradiol Triphasic (ORTHO TRI-CYCLEN LO) 0.18/0.215/0.25 MG-25 MCG tab, Take 1 tablet by mouth daily., Disp: 1 Package, Rfl: 11 .  TURMERIC PO, Take by mouth., Disp: , Rfl:  No current facility-administered medications for this visit.   Facility-Administered Medications Ordered in Other Visits:  .  gadopentetate dimeglumine (MAGNEVIST) injection 18 mL, 18 mL, Intravenous, Once PRN, Sater, Pearletha Furl, MD  No Known Allergies  ROS   No other specific complaints in a complete review of systems (except as listed in HPI above).  Objective  Vitals:  06/17/18 1527  BP: 110/66  Pulse: 90  Resp: 12  Temp: 98.4 F (36.9 C)  TempSrc: Oral  SpO2: 94%  Weight: 227 lb 8 oz (103.2 kg)  Height: 5\' 3"  (1.6 m)    Body mass index is 40.3 kg/m.  Nursing Note and Vital Signs reviewed.  Physical Exam  Constitutional: Patient appears well-developed and well-nourished. Obese No distress.  Cardiovascular: Normal rate Pulmonary/Chest: Effort normal. No respiratory distress or retractions. Abdominal: Soft and non-tender, bowel sounds present. Psychiatric:  Patient has a normal mood and affect. behavior is normal. Judgment and thought content normal.  No results found for this or any previous visit (from the past 72 hour(s)).  Assessment & Plan  1. Vaginal discharge - education provided.  - WET PREP BY MOLECULAR PROBE - C. trachomatis/N. gonorrhoeae RNA  2. Irregular periods Discussed oral contraceptives and pregnancy test, pt politely declines states she took one at home and was negative.  - Ambulatory referral to Gynecology  3. Recurrent candidiasis of vagina - WET PREP BY MOLECULAR PROBE - Ambulatory referral to Gynecology - daily probiotic, reeinforced education for prevention   --------------------------------------- I have reviewed this encounter including the documentation in this note and/or discussed this patient with the provider, Sharyon Cable DNP AGNP-C. I am certifying that I agree with the content of this note as supervising physician. Baruch Gouty, MD [facility] 06/17/2018, 5:14 PM

## 2018-06-17 NOTE — Telephone Encounter (Signed)
Scheduled to see Lanora Manis

## 2018-06-17 NOTE — Telephone Encounter (Signed)
Please have her do an e-visit (see last)

## 2018-06-17 NOTE — Addendum Note (Signed)
Addended by: Cheryle Horsfall on: 06/17/2018 04:28 PM   Modules accepted: Orders

## 2018-06-17 NOTE — Patient Instructions (Addendum)
- we will send you a medication based on your wet prep results - Take a daily probiotic    Vaginal Yeast infection, Adult Vaginal yeast infection is a condition that causes soreness, swelling, and redness (inflammation) of the vagina. It also causes vaginal discharge. This is a common condition. Some women get this infection frequently. What are the causes? This condition is caused by a change in the normal balance of the yeast (candida) and bacteria that live in the vagina. This change causes an overgrowth of yeast, which causes the inflammation. What increases the risk? This condition is more likely to develop in:  Women who take antibiotic medicines.  Women who have diabetes.  Women who take birth control pills.  Women who are pregnant.  Women who douche often.  Women who have a weak defense (immune) system.  Women who have been taking steroid medicines for a [place] time.  Women who frequently wear tight clothing.  What are the signs or symptoms? Symptoms of this condition include:  White, thick vaginal discharge.  Swelling, itching, redness, and irritation of the vagina. The lips of the vagina (vulva) may be affected as well.  Pain or a burning feeling while urinating.  Pain during sex.  How is this diagnosed? This condition is diagnosed with a medical history and physical exam. This will include a pelvic exam. Your health care provider will examine a sample of your vaginal discharge under a microscope. Your health care provider may send this sample for testing to confirm the diagnosis. How is this treated? This condition is treated with medicine. Medicines may be over-the-counter or prescription. You may be told to use one or more of the following:  Medicine that is taken orally.  Medicine that is applied as a cream.  Medicine that is inserted directly into the vagina (suppository).  Follow these instructions at home:  Take or apply over-the-counter and  prescription medicines only as told by your health care provider.  Do not have sex until your health care provider has approved. Tell your sex partner that you have a yeast infection. That person should go to his or her health care provider if he or she develops symptoms.  Do not wear tight clothes, such as pantyhose or tight pants.  Avoid using tampons until your health care provider approves.  Eat more yogurt. This may help to keep your yeast infection from returning.  Try taking a sitz bath to help with discomfort. This is a warm water bath that is taken while you are sitting down. The water should only come up to your hips and should cover your buttocks. Do this 3-4 times per day or as told by your health care provider.  Do not douche.  Wear breathable, cotton underwear.  If you have diabetes, keep your blood sugar levels under control. Contact a health care provider if:  You have a fever.  Your symptoms go away and then return.  Your symptoms do not get better with treatment.  Your symptoms get worse.  You have new symptoms.  You develop blisters in or around your vagina.  You have blood coming from your vagina and it is not your menstrual period.  You develop pain in your abdomen. This information is not intended to replace advice given to you by your health care provider. Make sure you discuss any questions you have with your health care provider. Document Released: 08/29/2005 Document Revised: 05/02/2016 Document Reviewed: 05/23/2015 Elsevier Interactive Patient Education  2018 [employer].

## 2018-06-18 ENCOUNTER — Telehealth: Payer: Self-pay | Admitting: Obstetrics & Gynecology

## 2018-06-18 NOTE — Telephone Encounter (Signed)
Called and left a message for patient to call back to schedule a new patient doctor referral appointment with our office to see any provider for irregular periods. °

## 2018-06-19 LAB — CERVICOVAGINAL ANCILLARY ONLY
Bacterial vaginitis: NEGATIVE
Candida vaginitis: POSITIVE — AB
Chlamydia: NEGATIVE
NEISSERIA GONORRHEA: NEGATIVE
TRICH (WINDOWPATH): NEGATIVE

## 2018-06-19 NOTE — Telephone Encounter (Signed)
Called and left a message for patient to call back to schedule a new patient doctor referral appointment with our office to see any provider for irregular periods. °

## 2018-06-20 ENCOUNTER — Other Ambulatory Visit: Payer: Self-pay | Admitting: Family Medicine

## 2018-06-23 ENCOUNTER — Other Ambulatory Visit: Payer: Self-pay | Admitting: Family Medicine

## 2018-06-23 DIAGNOSIS — B3731 Acute candidiasis of vulva and vagina: Secondary | ICD-10-CM

## 2018-06-23 DIAGNOSIS — B373 Candidiasis of vulva and vagina: Secondary | ICD-10-CM

## 2018-06-23 MED ORDER — FLUCONAZOLE 150 MG PO TABS: 150.0000 mg | ORAL_TABLET | Freq: Once | ORAL | 0 refills | Status: AC

## 2018-06-23 NOTE — Telephone Encounter (Signed)
Called and left a message for patient to call back to schedule a new patient doctor referral appointment with our office to see any provider for irregular periods.

## 2018-06-26 DIAGNOSIS — G35 Multiple sclerosis: Secondary | ICD-10-CM | POA: Diagnosis not present

## 2018-07-01 DIAGNOSIS — F4320 Adjustment disorder, unspecified: Secondary | ICD-10-CM | POA: Diagnosis not present

## 2018-07-22 ENCOUNTER — Telehealth: Payer: 59 | Admitting: Physician Assistant

## 2018-07-22 DIAGNOSIS — N3000 Acute cystitis without hematuria: Secondary | ICD-10-CM | POA: Diagnosis not present

## 2018-07-22 MED ORDER — NITROFURANTOIN MONOHYD MACRO 100 MG PO CAPS: 100.0000 mg | ORAL_CAPSULE | Freq: Two times a day (BID) | ORAL | 0 refills | Status: AC

## 2018-07-22 MED FILL — NITROFURANTOIN MONO-MCR 100: 100 | 5 days supply | Qty: 10 | Fill #0

## 2018-07-22 NOTE — Progress Notes (Signed)

## 2018-07-24 ENCOUNTER — Encounter: Payer: Self-pay | Admitting: Obstetrics and Gynecology

## 2018-07-24 ENCOUNTER — Other Ambulatory Visit: Payer: Self-pay

## 2018-07-24 ENCOUNTER — Ambulatory Visit: Payer: 59 | Admitting: Obstetrics and Gynecology

## 2018-07-24 VITALS — BP 104/76 | HR 80 | Resp 16 | Ht 63.0 in | Wt 228.0 lb

## 2018-07-24 DIAGNOSIS — N926 Irregular menstruation, unspecified: Secondary | ICD-10-CM | POA: Diagnosis not present

## 2018-07-24 DIAGNOSIS — N761 Subacute and chronic vaginitis: Secondary | ICD-10-CM

## 2018-07-24 DIAGNOSIS — Z113 Encounter for screening for infections with a predominantly sexual mode of transmission: Secondary | ICD-10-CM

## 2018-07-24 DIAGNOSIS — N912 Amenorrhea, unspecified: Secondary | ICD-10-CM

## 2018-07-24 DIAGNOSIS — Z01419 Encounter for gynecological examination (general) (routine) without abnormal findings: Secondary | ICD-10-CM | POA: Diagnosis not present

## 2018-07-24 DIAGNOSIS — G35 Multiple sclerosis: Secondary | ICD-10-CM | POA: Diagnosis not present

## 2018-07-24 LAB — POCT URINE PREGNANCY: Preg Test, Ur: NEGATIVE

## 2018-07-24 MED ORDER — NORETHINDRONE ACET-ETHINYL EST 1.5-30 MG-MCG PO TABS: 1.0000 | ORAL_TABLET | Freq: Every day | ORAL | 3 refills | Status: DC

## 2018-07-24 NOTE — Progress Notes (Signed)
27 y.o. G1P0010 Single African American female here as a new patient referred from PCP for an annual exam, irregular menses, and recurrent yeast infection. Patient is currently being treated for a UTI with Macrobid.  Feels a little itching today.  Having recurrent yeast infections.  Has gained 30 pounds and attributes her weight to this.  Feels this is getting better.  States she may have had itching from Monistat in the past.   No menses since Apr 12, 2018.  Stopped Ocella prior to this.  Skipped menses is a new issue for her.  Feels like pills work well for her.   Started Ocella in high school and takes it off and on.  Usually it returns with discontinuing the pills.  Otherwise was getting regular menses.   Denies excessive hair growth.  Has some fatigue and has MS.  No headaches.  Thinks she is actually having hair loss from her MS medication.  No milky discharge from the breast.   Recent STD screening.   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  UPT = negative.   PCP: Baruch Gouty, MD  Patient's last menstrual period was 04/12/2018.     Period Cycle (Days): (irregular ) Period Duration (Days): 5-6 Period Pattern: (!) Irregular Menstrual Flow: Moderate Menstrual Control: Maxi pad, Tampon Menstrual Control Change Freq (Hours): 3-4 Dysmenorrhea: (!) Mild Dysmenorrhea Symptoms: Cramping     Sexually active: Yes.    The current method of family planning is condoms most of the time.    Exercising: No.  The patient does not participate in regular exercise at present. Smoker:  no  Health Maintenance: Pap:  11/05/16 Neg:Neg HR HPV w/PCP History of abnormal Pap:  Yes, normal since, no treatment per patient  TDaP:  10/22/13 Gardasil:   Unsure.  May have done in middle school. HIV and Hep C: 04/24/18 Negative Screening Labs:  PCP   reports that she has never smoked. She has never used smokeless tobacco. She reports that she drinks alcohol. She reports that she does  not use drugs.  Past Medical History:  Diagnosis Date  . Anxiety   . Chlamydia   . History of abnormal cervical Papanicolaou smear   . MS (multiple sclerosis) [facility]) April 2016    History reviewed. No pertinent surgical history.  Current Outpatient Medications  Medication Sig Dispense Refill  . cetirizine (ZYRTEC) 10 MG tablet Take 10 mg by mouth daily.    . cholecalciferol (VITAMIN D) 1000 units tablet Take 1,000 Units by mouth daily.    Marland Kitchen LORazepam (ATIVAN) 0.5 MG tablet Take 1 tablet (0.5 mg total) by mouth 2 (two) times daily as needed for anxiety. 15 tablet 0  . natalizumab (TYSABRI) 300 MG/15ML injection Inject 15 mLs into the vein every 30 (thirty) days.     . nitrofurantoin, macrocrystal-monohydrate, (MACROBID) 100 MG capsule Take 1 capsule (100 mg total) by mouth 2 (two) times daily for 5 days. 10 capsule 0  . vitamin C (ASCORBIC ACID) 500 MG tablet Take 500 mg by mouth daily.     No current facility-administered medications for this visit.    Facility-Administered Medications Ordered in Other Visits  Medication Dose Route Frequency Provider Last Rate Last Dose  . gadopentetate dimeglumine (MAGNEVIST) injection 18 mL  18 mL Intravenous Once PRN Sater, Pearletha Furl, MD        Family History  Problem Relation Age of Onset  . Diabetes Father   . Hypertension Father   . Stroke Father   .  Cancer Mother        lung  . Hyperlipidemia Mother   . Lung disease Mother   . Eczema Brother     Review of Systems  Genitourinary:       Irregular menses Vaginal irritation  All other systems reviewed and are negative.   Exam:   BP 104/76 (BP Location: Right Arm, Patient Position: Sitting, Cuff Size: Large)   Pulse 80   Resp 16   Ht 5\' 3"  (1.6 m)   Wt 228 lb (103.4 kg)   LMP 04/12/2018   BMI 40.39 kg/m     General appearance: alert, cooperative and appears stated age Head: Normocephalic, without obvious abnormality, atraumatic Neck: no adenopathy, supple, symmetrical,  trachea midline and thyroid normal to inspection and palpation Lungs: clear to auscultation bilaterally Breasts: normal appearance, no masses or tenderness, No nipple retraction or dimpling, No nipple discharge or bleeding, No axillary or supraclavicular adenopathy Heart: regular rate and rhythm Abdomen: soft, non-tender; no masses, no organomegaly Extremities: extremities normal, atraumatic, no cyanosis or edema Skin: Skin color, texture, turgor normal. No rashes or lesions Lymph nodes: Cervical, supraclavicular, and axillary nodes normal. No abnormal inguinal nodes palpated Neurologic: Grossly normal  Pelvic: External genitalia:  no lesions              Urethra:  normal appearing urethra with no masses, tenderness or lesions              Bartholins and Skenes: normal                 Vagina: normal appearing vagina with normal color and discharge, no lesions              Cervix: no lesions              Pap taken: No. Bimanual Exam:  Uterus:  normal size, contour, position, consistency, mobility, non-tender              Adnexa: no mass, fullness, tenderness         Chaperone was present for exam.  Assessment:     Well woman visit with GYN exam. Amenorrhea off Ocella.  Suspect PCOS.  Weight gain.  Recurrent vaginitis.  Recent UTI.  Plan:  Mammogram age 23.  Self breast awareness recommended.  Pap next year.  Nuswab.  We talked about risk factors for recurrent yeast infections. Testosterone E2, FSH, LH, prolactin, TSH.  STD testing done.  Loestrin 1.5/30.  Follow up annually and prn.   After visit summary provided.

## 2018-07-24 NOTE — Patient Instructions (Signed)

## 2018-07-25 LAB — HEP, RPR, HIV PANEL
HEP B S AG: NEGATIVE
HIV Screen 4th Generation wRfx: NONREACTIVE
RPR: NONREACTIVE

## 2018-07-25 LAB — PROLACTIN: Prolactin: 22.5 ng/mL (ref 4.8–23.3)

## 2018-07-25 LAB — HEPATITIS C ANTIBODY

## 2018-07-25 LAB — FSH/LH
FSH: 8.7 m[IU]/mL
LH: 34.6 m[IU]/mL

## 2018-07-25 LAB — TSH: TSH: 1.78 u[IU]/mL (ref 0.450–4.500)

## 2018-07-25 LAB — ESTRADIOL: Estradiol: 49.7 pg/mL

## 2018-07-25 LAB — TESTOSTERONE: Testosterone: 76 ng/dL — ABNORMAL HIGH (ref 8–48)

## 2018-07-26 LAB — NUSWAB VAGINITIS PLUS (VG+)
Candida albicans, NAA: POSITIVE — AB
Candida glabrata, NAA: NEGATIVE
Chlamydia trachomatis, NAA: NEGATIVE
NEISSERIA GONORRHOEAE, NAA: NEGATIVE
Trich vag by NAA: NEGATIVE

## 2018-07-26 NOTE — Addendum Note (Signed)
Addended by: Ardell Isaacs, Laelah Siravo E on: 07/26/2018 11:58 AM   Modules accepted: Level of Service

## 2018-07-28 ENCOUNTER — Telehealth: Payer: Self-pay | Admitting: *Deleted

## 2018-07-28 MED ORDER — FLUCONAZOLE 150 MG PO TABS: 150.0000 mg | ORAL_TABLET | ORAL | 4 refills | Status: DC

## 2018-07-28 MED ORDER — FLUCONAZOLE 150 MG PO TABS: ORAL_TABLET | ORAL | 0 refills | Status: DC

## 2018-07-28 NOTE — Telephone Encounter (Signed)
Patient returning call to Jill Hamm, RN. °

## 2018-07-28 NOTE — Telephone Encounter (Signed)
Notes recorded by Leda Min, RN on 07/28/2018 at 1:19 PM EDT Left message to call Noreene Larsson at [phone].

## 2018-07-28 NOTE — Telephone Encounter (Signed)
-----   Message from Patton Salles, MD sent at 07/25/2018  5:17 PM EDT ----- Please report results of testing to patient.  Her labs are consistent with polycystic ovarian disease.  Her testosterone is mildly elevated, and her ratios of her LH and FSH hormones are classic for this condition.  She is not menopausal.  Her thyroid and prolactin are normal.   Hepatitis B, C, HIV, and syphilis testing are negative.   The remainder of her testing is pending.   I would like for her to have a recheck with me in 3 months to be sure she is doing well on her OCP and to do a blood pressure check. She has gained weight recently, and we need to be sure that this pill is the best one for her.

## 2018-07-28 NOTE — Telephone Encounter (Signed)
-----   Message from Patton Salles, MD sent at 07/28/2018 12:35 PM EDT ----- Please report results of testing to patient.  She has yeast and I am recommending Diflucan 150 mg po q 72 hours x 3 doses.  Then she will start Diflucan 150 mg weekly for 6 months for suppression of recurrent infection.  All other testing is negative for BV, trich, GC, and CT.

## 2018-07-28 NOTE — Telephone Encounter (Signed)
Spoke with patient, advised as seen below per Dr. Edward Jolly. Patient request 30 day supply. Rx to verified pharmacy. Patient verbalizes understanding and is agreeable.   Encounter closed.

## 2018-07-28 NOTE — Telephone Encounter (Signed)
Notes recorded by Leda Min, RN on 07/28/2018 at 2:22 PM EDT Left message to call Noreene Larsson at [phone]

## 2018-07-29 NOTE — Telephone Encounter (Signed)
Returned call to patient. Message from Dr. Edward Jolly given.  Verbalized understanding of results.  Has follow up scheduled for Visit with Dr. Edward Jolly on 10/23/2018.  Message to patient via mychart with information regarding polycystic ovarian syndrome. Patient will call back with any further questions or concerns.   Encounter closed.

## 2018-07-29 NOTE — Telephone Encounter (Signed)
Patient returning call.

## 2018-07-31 ENCOUNTER — Encounter: Payer: Self-pay | Admitting: Neurology

## 2018-07-31 ENCOUNTER — Ambulatory Visit: Payer: 59 | Admitting: Neurology

## 2018-07-31 ENCOUNTER — Other Ambulatory Visit: Payer: Self-pay

## 2018-07-31 VITALS — BP 108/81 | HR 85 | Resp 18 | Ht 63.0 in | Wt 226.0 lb

## 2018-07-31 DIAGNOSIS — G35 Multiple sclerosis: Secondary | ICD-10-CM

## 2018-07-31 DIAGNOSIS — F411 Generalized anxiety disorder: Secondary | ICD-10-CM | POA: Diagnosis not present

## 2018-07-31 DIAGNOSIS — Z79899 Other long term (current) drug therapy: Secondary | ICD-10-CM

## 2018-07-31 DIAGNOSIS — R200 Anesthesia of skin: Secondary | ICD-10-CM

## 2018-07-31 MED ORDER — LORAZEPAM 0.5 MG PO TABS: 0.5000 mg | ORAL_TABLET | Freq: Two times a day (BID) | ORAL | 1 refills | Status: DC | PRN

## 2018-07-31 NOTE — Progress Notes (Signed)
[facility]  PATIENT: Tracey Lindsey DOB: [birth year]  REFERRING DOCTOR OR PCP:  Luan Pulling Jaci Lazier) SOURCE: Patient and ER records and PACS images  _________________________________   HISTORICAL  CHIEF COMPLAINT:  Chief Complaint  Patient presents with  . Multiple Sclerosis    Sts. she continues to toelrate Tysabri well.  JCV ab last checked 01/30/18 and was Negative at 0.14.  Recently dx. with polycystic ovarian syndrome/fim    HISTORY OF PRESENT ILLNESS:  Tracey Lindsey is a 27 y.o. woman recently diagnosed with MS.     Update 07/31/2018: She is on Tysabri and she tolerates it well.    She denies any new MS symptoms and no exacerbation.    She is walking well and she can climb stairs and a ladder.     She denies weakness or spasticity    Her fingers feel tigh if she does a lot of typing.    Bladder function is ok with mild frequency.     Vision is doing well.  She has some fatigue.   She sleeps well most nights.    She denies depression but notes some anxiety.   She takes lorazepam prn.   No recent panic attacks.    She deneis any cognitive issues.    Her last MRis were from 10/2016 and showed no new lesions.  She was diagnosed with PCOS and placed on hormone therapy.  Update 01/30/2018: Her MS has done very well on Tysabri. She has not had any exacerbations. She tolerates the medication well. Her JCV antibody was last tested 07/24/2017. It was negative at 0.11 at that time..       She has urinary frequency but no incontinence.    Vision is fine.     She has some fatigue but is able to do everthing that she wants to do.    She denies depression but notes generalized anxiety.   There are no panic attacks.   She takes lorazepam just 2 x a month on average.    She has some insomnia, also has some nocturia, twice most nights. She denies any difficulty with her cognition.  Her headaches are doing better.   She took imipramine.   HA improved but not sure  if due to imipramine..   Imipramine helped her sleep and nocturia. She stopped after a few weeks.      From 07/24/2017:  MS:  She is on Tysabri and tolerates it well.  She is JCV Ab negative (0.08  on 01/24/2017)   She denies any new exacerbation.   Cervical spine and brain November 2017 was personally reviewed. There were no new lesions. The previously enhancing lesions were smaller and no longer enhanced.  Headaches:   She is still getting frequent headaches. (>25/30 a month).   Pain is bilateral and is usually mild to moderate.   It builds up daily at work.  HA is sometimes present when she wakes up though.  She gets a pressure sensation.  HA is usually milder the next morning.   It only seems to occur when she tries to go to sleep.  No nausea or vomiting.   Moving does not alter the pain much.   She denies occipital pain.    She notes pain in the back of the head/upper neck as well.   She does not take anything for the headache.  She is not sure if imipramine helped any when she tried in the past.  Gait/strength/sensation:  Her gait is back to baseline. She denies any weakness or spasticity in the arms or legs. There is no numbness.   Bladder/bowel:  She has urinary frequency only at night.    She has 3-4 times nocturia many nights.   No incontinence.  No recent UTI's.   No hesitancy.   Bowel is fine  Vision/ears:  Diplopia resolved. She denies any history of optic neuritis. She does not have any visual acuity issues or problems with color vision..  Fatigue/sleep:   She reports mild fatigue. This is worse with heat. She is working full-time.      She wakes up some at night but usually falls back asleep.   he is sleeping worse with frequent awakenings for nocturia.     Imipramine helped but caused a hangover.     Mood/Cognition:   She notes some anxiety but no depression.   She denies any significant cognitive change.   MS History:   In retrospect, she had an episode of vertigo in 2014 with mild  vision changes that improved.    In March 2016, she had vertigo that improved after a couple weeks.  In late April, she had  numbness in the left foot. When she woke up the next morning, she noted that the numbness was higher up the left arm and the next day it also involved the left hand and she went to the emergency room. In the emergency room, she had an MRI of the brain that showed white matter foci were some for multiple sclerosis.   MRI of the brain with and without contrast 03/29/15 showed several posterior fossa lesions with one in the right middle cerebellar peduncle and several in the pons.   The middle cerebellar peduncle focus and 2 of the pontine foci enhanced. There are also several periventricular and deep white matter foci in the cerebral hemispheres.   MRI cervical spine shows one enhancing focus.  Repeat imaging of the brain and cervical spine November 2017 did not show any new lesions. The previously enhancing lesions were smaller and did not enhance.Marland Kitchen       REVIEW OF SYSTEMS: Constitutional: No fevers, chills, sweats, or change in appetite.   She notes some fatigue. Eyes: No visual changes, double vision, eye pain Ear, nose and throat: No hearing loss, ear pain, nasal congestion, sore throat Cardiovascular: No chest pain, palpitations Respiratory: No shortness of breath at rest or with exertion.   No wheezes GastrointestinaI: No nausea, vomiting, diarrhea, abdominal pain, fecal incontinence Genitourinary: Notes frequency, nocturia (x2).   She has PCOS Musculoskeletal: No neck pain, back pain Integumentary: No rash, pruritus, skin lesions Neurological: as above Psychiatric: as above  Endocrine: No palpitations, diaphoresis, change in appetite, change in weigh or increased thirst Hematologic/Lymphatic: No anemia, purpura, petechiae. Allergic/Immunologic: No itchy/runny eyes, nasal congestion, recent allergic reactions, rashes  ALLERGIES: No Known Allergies  HOME  MEDICATIONS:  Current Outpatient Medications:  .  cetirizine (ZYRTEC) 10 MG tablet, Take 10 mg by mouth daily., Disp: , Rfl:  .  cholecalciferol (VITAMIN D) 1000 units tablet, Take 1,000 Units by mouth daily., Disp: , Rfl:  .  fluconazole (DIFLUCAN) 150 MG tablet, Take one tablet (150 mg) po q 72 hours x 3 doses. Then one tablet weekly for 6 months., Disp: 6 tablet, Rfl: 0 .  fluconazole (DIFLUCAN) 150 MG tablet, Take 1 tablet (150 mg total) by mouth once a week. For six months., Disp: 4 tablet, Rfl: 4 .  LORazepam (ATIVAN) 0.5  MG tablet, Take 1 tablet (0.5 mg total) by mouth 2 (two) times daily as needed for anxiety., Disp: 15 tablet, Rfl: 1 .  natalizumab (TYSABRI) 300 MG/15ML injection, Inject 15 mLs into the vein every 30 (thirty) days. , Disp: , Rfl:  .  Norethindrone Acetate-Ethinyl Estradiol (LOESTRIN 1.5/30, 21,) 1.5-30 MG-MCG tablet, Take 1 tablet by mouth daily., Disp: 3 Package, Rfl: 3 .  vitamin C (ASCORBIC ACID) 500 MG tablet, Take 500 mg by mouth daily., Disp: , Rfl:  No current facility-administered medications for this visit.   Facility-Administered Medications Ordered in Other Visits:  .  gadopentetate dimeglumine (MAGNEVIST) injection 18 mL, 18 mL, Intravenous, Once PRN, Sater, Pearletha Furl, MD  PAST MEDICAL HISTORY: Past Medical History:  Diagnosis Date  . Anxiety   . Chlamydia   . History of abnormal cervical Papanicolaou smear   . MS (multiple sclerosis) [facility]) April 2016    PAST SURGICAL HISTORY: History reviewed. No pertinent surgical history.  FAMILY HISTORY: Family History  Problem Relation Age of Onset  . Diabetes Father   . Hypertension Father   . Stroke Father   . Cancer Mother        lung  . Hyperlipidemia Mother   . Lung disease Mother   . Eczema Brother     SOCIAL HISTORY:  Social History   Socioeconomic History  . Marital status: Single    Spouse name: Not on file  . Number of children: Not on file  . Years of education: Not on file  .  Highest education level: Not on file  Occupational History  . Occupation: unemployeed   Social Needs  . Financial resource strain: Not on file  . Food insecurity:    Worry: Not on file    Inability: Not on file  . Transportation needs:    Medical: Not on file    Non-medical: Not on file  Tobacco Use  . Smoking status: Never Smoker  . Smokeless tobacco: Never Used  Substance and Sexual Activity  . Alcohol use: Yes    Comment: occasionally  . Drug use: Never  . Sexual activity: Yes    Partners: Male    Birth control/protection: Condom  Lifestyle  . Physical activity:    Days per week: Not on file    Minutes per session: Not on file  . Stress: Not on file  Relationships  . Social connections:    Talks on phone: Not on file    Gets together: Not on file    Attends religious service: Not on file    Active member of club or organization: Not on file    Attends meetings of clubs or organizations: Not on file    Relationship status: Not on file  . Intimate partner violence:    Fear of current or ex partner: Not on file    Emotionally abused: Not on file    Physically abused: Not on file    Forced sexual activity: Not on file  Other Topics Concern  . Not on file  Social History Narrative   Lives with mother    Drinks soda daily (8-10 ounces)     PHYSICAL EXAM  Vitals:   07/31/18 1555  BP: 108/81  Pulse: 85  Resp: 18  Weight: 226 lb (102.5 kg)  Height: 5\' 3"  (1.6 m)    Body mass index is 40.03 kg/m.   General: The patient is well-developed and well-nourished and in no acute distress   Neurologic Exam  Mental  status: The patient is alert and oriented x 3 at the time of the examination. The patient has apparent normal recent and remote memory, with an apparently normal attention span and concentration ability.   Speech is normal.  Cranial nerves:   Extraocular muscles are intact. Facial strength and sensation normal. Trapezius strength is strong.. No obvious  hearing deficits are noted.  Motor:  Muscle bulk is normal.   Muscle tone is normal.  Strength is 5/5 in the arms and legs.  She no longer has drift on the left  Sensory:   She has normal touch and vibration sensation in the arms and legs.  Coordination: Cerebellar testing reveals good finger-nose-finger bilaterally.  Heel-to-shin was fairly.     Gait and station: Station is normal.   Gait is normal. Tandem gait is very mildly wide. Romberg is negative now.   Reflexes: Deep tendon reflexes are symmetric and increased at the knees with spread.  There is no ankle clonus.Marland Kitchen    DIAGNOSTIC DATA (LABS, IMAGING, TESTING) - I reviewed patient records, labs, notes, testing and imaging myself where available.  Lab Results  Component Value Date   WBC 8.4 04/24/2018   HGB 14.0 04/24/2018   HCT 40.9 04/24/2018   MCV 88.9 04/24/2018   PLT 246 04/24/2018      Component Value Date/Time   NA 141 04/24/2018 1138   NA 138 11/14/2015 1417   K 3.6 04/24/2018 1138   CL 105 04/24/2018 1138   CO2 27 04/24/2018 1138   GLUCOSE 87 04/24/2018 1138   BUN 11 04/24/2018 1138   BUN 9 11/14/2015 1417   CREATININE 0.75 04/24/2018 1138   CALCIUM 9.7 04/24/2018 1138   PROT 6.7 04/24/2018 1138   PROT 6.7 11/14/2015 1417   ALBUMIN 4.6 11/14/2015 1417   AST 17 04/24/2018 1138   ALT 15 04/24/2018 1138   ALKPHOS 48 11/14/2015 1417   BILITOT 0.5 04/24/2018 1138   BILITOT 0.5 11/14/2015 1417   GFRNONAA 110 04/24/2018 1138   GFRAA 127 04/24/2018 1138   No results found.    ASSESSMENT AND PLAN  MS (multiple sclerosis) (HCC) - Plan: Stratify JCV Antibody Test ([facility]), CBC with Differential/Platelet  Anxiety state  High risk medication use - Plan: Stratify JCV Antibody Test ([facility]), CBC with Differential/Platelet  Numbness  Multiple sclerosis (HCC) - Plan: MR BRAIN W WO CONTRAST   1.   She will continue Tysabri.  I will check a JCV antibody and CBC with differential today.  An MRI of the brain will  also be evaluated.  If she has any subclinical progression.  We would need to consider a different disease modifying therapy.    2.   Prescription for small amount of lorazepam to take as needed.  She usually just takes 1 or 2 a month. 3.  Stay active and exercise as tolerated.  4.    She will return to see me in 6 months for regular visits and call sooner if she has new or worsening symptoms.    Richard A. Epimenio Foot, MD, PhD 07/31/2018, 5:14 PM Certified in Neurology, Clinical Neurophysiology, Sleep Medicine, Pain Medicine and Neuroimaging  [facility] [address], [address] [place], [place] [address] [phone]

## 2018-08-01 ENCOUNTER — Telehealth: Payer: Self-pay | Admitting: Neurology

## 2018-08-01 LAB — CBC WITH DIFFERENTIAL/PLATELET
BASOS: 1 %
Basophils Absolute: 0.1 10*3/uL (ref 0.0–0.2)
EOS (ABSOLUTE): 0.3 10*3/uL (ref 0.0–0.4)
EOS: 3 %
HEMATOCRIT: 41.1 % (ref 34.0–46.6)
HEMOGLOBIN: 13.6 g/dL (ref 11.1–15.9)
IMMATURE GRANS (ABS): 0 10*3/uL (ref 0.0–0.1)
IMMATURE GRANULOCYTES: 0 %
LYMPHS: 35 %
Lymphocytes Absolute: 3.5 10*3/uL — ABNORMAL HIGH (ref 0.7–3.1)
MCH: 29.9 pg (ref 26.6–33.0)
MCHC: 33.1 g/dL (ref 31.5–35.7)
MCV: 90 fL (ref 79–97)
MONOCYTES: 7 %
MONOS ABS: 0.7 10*3/uL (ref 0.1–0.9)
NEUTROS PCT: 54 %
Neutrophils Absolute: 5.6 10*3/uL (ref 1.4–7.0)
Platelets: 255 10*3/uL (ref 150–450)
RBC: 4.55 x10E6/uL (ref 3.77–5.28)
RDW: 12.7 % (ref 12.3–15.4)
WBC: 10.2 10*3/uL (ref 3.4–10.8)

## 2018-08-01 NOTE — Telephone Encounter (Signed)
lvm for pt to call back to schedule mri  [facility] auth: NPR Ref # [id number]

## 2018-08-11 NOTE — Telephone Encounter (Signed)
Patient returned my call she is scheduled for 08/19/18 at [facility].  °

## 2018-08-19 ENCOUNTER — Ambulatory Visit: Payer: 59

## 2018-08-19 DIAGNOSIS — G35 Multiple sclerosis: Secondary | ICD-10-CM | POA: Diagnosis not present

## 2018-08-19 MED ORDER — GADOBENATE DIMEGLUMINE 529 MG/ML IV SOLN
20.0000 mL | Freq: Once | INTRAVENOUS | Status: AC | PRN
  Administered 2018-08-19: 20 mL via INTRAVENOUS

## 2018-08-21 DIAGNOSIS — G35 Multiple sclerosis: Secondary | ICD-10-CM | POA: Diagnosis not present

## 2018-08-26 ENCOUNTER — Telehealth: Payer: Self-pay | Admitting: *Deleted

## 2018-08-26 NOTE — Telephone Encounter (Signed)
-----   Message from Asa Lente, MD sent at 08/25/2018  7:17 PM EDT ----- Please let the patient know that the MRI is unchanged... No new MS lesions

## 2018-08-26 NOTE — Telephone Encounter (Signed)
LMOM with below MRI report. She does not need tor return this call unless she has questions/fim

## 2018-09-18 DIAGNOSIS — G35 Multiple sclerosis: Secondary | ICD-10-CM | POA: Diagnosis not present

## 2018-10-16 ENCOUNTER — Telehealth: Payer: 59 | Admitting: Physician Assistant

## 2018-10-16 DIAGNOSIS — B9689 Other specified bacterial agents as the cause of diseases classified elsewhere: Secondary | ICD-10-CM | POA: Diagnosis not present

## 2018-10-16 DIAGNOSIS — G35 Multiple sclerosis: Secondary | ICD-10-CM | POA: Diagnosis not present

## 2018-10-16 DIAGNOSIS — J208 Acute bronchitis due to other specified organisms: Secondary | ICD-10-CM | POA: Diagnosis not present

## 2018-10-16 MED ORDER — AZITHROMYCIN 250 MG PO TABS: ORAL_TABLET | ORAL | 0 refills | Status: DC

## 2018-10-16 MED ORDER — BENZONATATE 100 MG PO CAPS: 100.0000 mg | ORAL_CAPSULE | Freq: Three times a day (TID) | ORAL | 0 refills | Status: DC | PRN

## 2018-10-16 NOTE — Progress Notes (Signed)

## 2018-10-20 ENCOUNTER — Telehealth: Payer: Self-pay | Admitting: *Deleted

## 2018-10-20 NOTE — Telephone Encounter (Signed)
Faxed completed/signed Tysabri pt status report and reauth questionnaire to [facility] at [phone]. Received confirmation.  

## 2018-10-20 NOTE — Telephone Encounter (Signed)
Received fax notification from [facility] prescribing program that pt re-authorized 10/20/18-05/20/19. Account: [facility]. Pt enrollment number: [id number]. Site auth number: [id number].

## 2018-10-23 ENCOUNTER — Telehealth: Payer: Self-pay | Admitting: Obstetrics and Gynecology

## 2018-10-23 ENCOUNTER — Encounter: Payer: 59 | Admitting: Obstetrics and Gynecology

## 2018-10-23 NOTE — Progress Notes (Signed)
Opened in error

## 2018-10-23 NOTE — Telephone Encounter (Addendum)
Patient canceled her 3:30pm appointment today for Follow up PCOS labs/BP Check/ and rescheduled to 11/05/2018. She had to leave the office at 3:56pm

## 2018-11-04 NOTE — Progress Notes (Signed)
GYNECOLOGY  VISIT   HPI: 27 y.o.   Single  African American  female   [id number] with Patient's last menstrual period was 10/16/2018 (exact date).   here for 3 month follow up after starting OCPs. Has PCOS.   Taking pills daily.  Monthly menses which are manageable.    Patient requesting STD testing because she has a new partner.  Constantly feels bloated.  Daily or every other day BM. Feels better after having a BM.  Trying to increase her activity.  GYNECOLOGIC HISTORY: Patient's last menstrual period was 10/16/2018 (exact date). Contraception: Junel Menopausal hormone therapy:  none Last mammogram:  n/a Last pap smear: 11-05-16 Neg:Neg HR HPV        OB History    Gravida  1   Para  0   Term  0   Preterm  0   AB  1   Living  0     SAB  0   TAB  0   Ectopic  0   Multiple  0   Live Births  0              Patient Active Problem List   Diagnosis Date Noted  . Screen for STD (sexually transmitted disease) 03/01/2017  . Encounter for screening for HIV 03/01/2017  . High risk medication use 01/24/2017  . Other headache syndrome 07/24/2016  . Obesity 11/19/2015  . Preventative health care 11/14/2015  . Oral contraceptive prescribed 06/08/2015  . Verruca vulgaris 06/08/2015  . Depression with anxiety 05/26/2015  . Weakness of left arm 05/26/2015  . Vertigo 04/04/2015  . Numbness 04/04/2015  . Vitamin D deficiency 04/04/2015  . 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 Anxiety state 04/04/2015  . Insomnia 04/04/2015  . MS (multiple sclerosis) (HCC) 03/04/2015    Past Medical History:  Diagnosis Date  . Anxiety   . Chlamydia   . History of abnormal cervical Papanicolaou smear   . MS (multiple sclerosis) (HCC) April 2016    No past surgical history on file.  Current Outpatient Medications  Medication Sig Dispense Refill  . cholecalciferol (VITAMIN D) 1000 units tablet Take 1,000 Units by mouth daily.    . fluconazole (DIFLUCAN) 150 MG tablet Take one tablet  (150 mg) po q 72 hours x 3 doses. Then one tablet weekly for 6 months. 6 tablet 0  . fluconazole (DIFLUCAN) 150 MG tablet Take 1 tablet (150 mg total) by mouth once a week. For six months. 4 tablet 4  . LORazepam (ATIVAN) 0.5 MG tablet Take 1 tablet (0.5 mg total) by mouth 2 (two) times daily as needed for anxiety. 15 tablet 1  . natalizumab (TYSABRI) 300 MG/15ML injection Inject 15 mLs into the vein every 30 (thirty) days.     . Norethindrone Acetate-Ethinyl Estradiol (LOESTRIN 1.5/30, 21,) 1.5-30 MG-MCG tablet Take 1 tablet by mouth daily. 3 Package 3  . vitamin C (ASCORBIC ACID) 500 MG tablet Take 500 mg by mouth daily.     No current facility-administered medications for this visit.    Facility-Administered Medications Ordered in Other Visits  Medication Dose Route Frequency Provider Last Rate Last Dose  . gadopentetate dimeglumine (MAGNEVIST) injection 18 mL  18 mL Intravenous Once PRN Sater, Pearletha Furl, MD         ALLERGIES: Patient has no known allergies.  Family History  Problem Relation Age of Onset  . Diabetes Father   . Hypertension Father   . Stroke Father   . Cancer Mother  lung  . Hyperlipidemia Mother   . Lung disease Mother   . Eczema Brother     Social History   Socioeconomic History  . Marital status: Single    Spouse name: Not on file  . Number of children: Not on file  . Years of education: Not on file  . Highest education level: Not on file  Occupational History  . Occupation: unemployeed   Social Needs  . Financial resource strain: Not on file  . Food insecurity:    Worry: Not on file    Inability: Not on file  . Transportation needs:    Medical: Not on file    Non-medical: Not on file  Tobacco Use  . Smoking status: Never Smoker  . Smokeless tobacco: Never Used  Substance and Sexual Activity  . Alcohol use: Yes    Comment: occasionally  . Drug use: Never  . Sexual activity: Yes    Partners: Male    Birth control/protection: Condom   Lifestyle  . Physical activity:    Days per week: Not on file    Minutes per session: Not on file  . Stress: Not on file  Relationships  . Social connections:    Talks on phone: Not on file    Gets together: Not on file    Attends religious service: Not on file    Active member of club or organization: Not on file    Attends meetings of clubs or organizations: Not on file    Relationship status: Not on file  . Intimate partner violence:    Fear of current or ex partner: Not on file    Emotionally abused: Not on file    Physically abused: Not on file    Forced sexual activity: Not on file  Other Topics Concern  . Not on file  Social History Narrative   Lives with mother    Drinks soda daily (8-10 ounces)    Review of Systems  Gastrointestinal:       Bloating   All other systems reviewed and are negative.   PHYSICAL EXAMINATION:    BP 112/68 (BP Location: Right Arm, Patient Position: Sitting, Cuff Size: Large)   Pulse 90   Ht 5\' 3"  (1.6 m)   Wt 233 lb 6.4 oz (105.9 kg)   LMP 10/16/2018 (Exact Date)   BMI 41.34 kg/m     General appearance: alert, cooperative and appears stated age  Pelvic: External genitalia:  no lesions              Urethra:  normal appearing urethra with no masses, tenderness or lesions              Bartholins and Skenes: normal                 Vagina: normal appearing vagina with normal color and discharge, no lesions              Cervix: no lesions                Bimanual Exam:  Uterus:  normal size, contour, position, consistency, mobility, non-tender              Adnexa: no mass, fullness, tenderness            Chaperone was present for exam.  ASSESSMENT  Hx amenorrhea with PCOS.  Surveillance of COCs.  STD screening.   PLAN  Continue COCs.  STD screening.  FU for annual exam and prn.  An After Visit Summary was printed and given to the patient.  _15_____ minutes face to face time of which over 50% was spent in counseling.

## 2018-11-05 ENCOUNTER — Encounter: Payer: Self-pay | Admitting: Obstetrics and Gynecology

## 2018-11-05 ENCOUNTER — Ambulatory Visit (INDEPENDENT_AMBULATORY_CARE_PROVIDER_SITE_OTHER): Payer: 59 | Admitting: Obstetrics and Gynecology

## 2018-11-05 ENCOUNTER — Other Ambulatory Visit (HOSPITAL_COMMUNITY)
Admission: RE | Admit: 2018-11-05 | Discharge: 2018-11-05 | Disposition: A | Discharge status: Home / Self Care | Payer: 59 | Source: Ambulatory Visit | Attending: Obstetrics and Gynecology | Admitting: Obstetrics and Gynecology

## 2018-11-05 ENCOUNTER — Other Ambulatory Visit: Payer: Self-pay

## 2018-11-05 VITALS — BP 112/68 | HR 90 | Ht 63.0 in | Wt 233.4 lb

## 2018-11-05 DIAGNOSIS — Z113 Encounter for screening for infections with a predominantly sexual mode of transmission: Secondary | ICD-10-CM

## 2018-11-05 DIAGNOSIS — Z3041 Encounter for surveillance of contraceptive pills: Secondary | ICD-10-CM | POA: Diagnosis not present

## 2018-11-05 MED ORDER — NORETHINDRONE ACET-ETHINYL EST 1.5-30 MG-MCG PO TABS: 1.0000 | ORAL_TABLET | Freq: Every day | ORAL | 2 refills | Status: DC

## 2018-11-07 LAB — HEP, RPR, HIV PANEL
HIV Screen 4th Generation wRfx: NONREACTIVE
Hepatitis B Surface Ag: NEGATIVE
RPR: NONREACTIVE

## 2018-11-07 LAB — CERVICOVAGINAL ANCILLARY ONLY
Chlamydia: NEGATIVE
Neisseria Gonorrhea: NEGATIVE
Trichomonas: NEGATIVE

## 2018-11-07 LAB — HEPATITIS C ANTIBODY: Hep C Virus Ab: <0.1 s/co ratio (ref 0.0–0.9)

## 2018-11-18 ENCOUNTER — Telehealth: Payer: Self-pay | Admitting: Neurology

## 2018-11-18 NOTE — Telephone Encounter (Signed)
Transferred pt to infusions  °

## 2018-11-20 NOTE — Telephone Encounter (Signed)
Transferred pt to infusions  °

## 2018-11-24 DIAGNOSIS — G35 Multiple sclerosis: Secondary | ICD-10-CM | POA: Diagnosis not present

## 2018-12-23 ENCOUNTER — Telehealth: Payer: Self-pay | Admitting: Neurology

## 2018-12-23 NOTE — Telephone Encounter (Signed)
pt has called for the intrafusion suite, call transferred °

## 2018-12-25 DIAGNOSIS — G35 Multiple sclerosis: Secondary | ICD-10-CM | POA: Diagnosis not present

## 2019-01-27 ENCOUNTER — Encounter (INDEPENDENT_AMBULATORY_CARE_PROVIDER_SITE_OTHER): Payer: 59

## 2019-01-28 ENCOUNTER — Telehealth: Payer: 59 | Admitting: Family

## 2019-01-28 DIAGNOSIS — L03119 Cellulitis of unspecified part of limb: Secondary | ICD-10-CM

## 2019-01-28 MED ORDER — CEPHALEXIN 500 MG PO CAPS: 500.0000 mg | ORAL_CAPSULE | Freq: Four times a day (QID) | ORAL | 0 refills | Status: AC

## 2019-01-28 MED FILL — CEPHALEXIN 500 MG CAPSULE: 500 | 5 days supply | Qty: 20 | Fill #0

## 2019-01-28 NOTE — Progress Notes (Signed)
E Visit for Cellulitis  We are sorry that you are not feeling well. Here is how we plan to help!  As discussed on our phone call, this sounds more like an infection since it started smaller and has grown and has warmth, tenderness, and redness.   Based on what you shared with me it looks like you have cellulitis.  Cellulitis looks like areas of skin redness, swelling, and warmth; it develops as a result of bacteria entering under the skin. Little red spots and/or bleeding can be seen in skin, and tiny surface sacs containing fluid can occur. Fever can be present. Cellulitis is almost always on one side of a body, and the lower limbs are the most common site of involvement.   I have prescribed:  Keflex 500mg  orally, 4 times a day for 5 days. Approximately, 10 minutes spent reviewing, documenting, and calling patient.    HOME CARE:  . Take your medications as ordered and take all of them, even if the skin irritation appears to be healing.   GET HELP RIGHT AWAY IF:  . Symptoms that don't begin to go away within 48 hours. . Severe redness persists or worsens . If the area turns color, spreads or swells. . If it blisters and opens, develops yellow-brown crust or bleeds. . You develop a fever or chills. . If the pain increases or becomes unbearable.  . Are unable to keep fluids and food down.  MAKE SURE YOU    Understand these instructions.  Will watch your condition.  Will get help right away if you are not doing well or get worse.  Thank you for choosing an e-visit. Your e-visit answers were reviewed by a board certified advanced clinical practitioner to complete your personal care plan. Depending upon the condition, your plan could have included both over the counter or prescription medications. Please review your pharmacy choice. Make sure the pharmacy is open so you can pick up prescription now. If there is a problem, you may contact your provider through [employer] and  have the prescription routed to another pharmacy. Your safety is important to [place]. If you have drug allergies check your prescription carefully.  For the next 24 hours you can use MyChart to ask questions about today's visit, request a non-urgent call back, or ask for a work or school excuse. You will get an email in the next two days asking about your experience. I hope that your e-visit has been valuable and will speed your recovery.

## 2019-02-02 ENCOUNTER — Encounter: Payer: Self-pay | Admitting: Neurology

## 2019-02-02 ENCOUNTER — Telehealth: Payer: Self-pay | Admitting: *Deleted

## 2019-02-02 ENCOUNTER — Ambulatory Visit: Payer: 59 | Admitting: Neurology

## 2019-02-02 VITALS — BP 110/78 | HR 75 | Wt 234.5 lb

## 2019-02-02 DIAGNOSIS — E559 Vitamin D deficiency, unspecified: Secondary | ICD-10-CM | POA: Diagnosis not present

## 2019-02-02 DIAGNOSIS — F411 Generalized anxiety disorder: Secondary | ICD-10-CM | POA: Diagnosis not present

## 2019-02-02 DIAGNOSIS — F418 Other specified anxiety disorders: Secondary | ICD-10-CM

## 2019-02-02 DIAGNOSIS — R5383 Other fatigue: Secondary | ICD-10-CM

## 2019-02-02 DIAGNOSIS — Z79899 Other long term (current) drug therapy: Secondary | ICD-10-CM

## 2019-02-02 DIAGNOSIS — G35 Multiple sclerosis: Secondary | ICD-10-CM | POA: Diagnosis not present

## 2019-02-02 DIAGNOSIS — R260 Ataxic gait: Secondary | ICD-10-CM

## 2019-02-02 NOTE — Progress Notes (Signed)
[facility]  PATIENT: MALESSA Lindsey DOB: Feb 09, 1991  REFERRING DOCTOR OR PCP:  Luan Pulling Jaci Lazier) SOURCE: Patient and ER records and PACS images  _________________________________   HISTORICAL  CHIEF COMPLAINT:  Chief Complaint  Patient presents with  . Follow-up    RM 12, alone. Last seen 07/31/18. No new sx.   . Multiple Sclerosis    On Tysabri. Last infusion cx on 01/22/19 d/t office closing for inclement weather. She got r/s for 02/05/19. Last infusion: 12/24/18.  JCV ab last checked 01/30/18 and was indeterminate at 0.23. Inhibition assay: Negative. NEEDS REPEAT JCV LAB TODAY    HISTORY OF PRESENT ILLNESS:  Tracey Lindsey is a 28 y.o. woman recently diagnosed with MS.    Update 02/02/2019: She has done well on Tysabri.   MRI 08/2018 showed no new lesions.   She tolerates it well and has no exacerbation.  Last JCV Ab is 0.23 (negative inhib assay).   Gait is good.   She can climb stairs and ladders and no recent falls.   Bladder function is good.   She has nocturia 1-2 times but no incontinence.    Vision is doing well.      She has fatigue.   She sleeps well most nights.   She is planning on joining a weight management programs.  She has mild depression and some anxiety but only neds lorazepam once a week.   No recent panic attacks.  Cognition is good.   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       Update 07/31/2018: She is on Tysabri and she tolerates it well.    She denies any new MS symptoms and no exacerbation.    She is walking well and she can climb stairs and a ladder.     She denies weakness or spasticity    Her fingers feel tigh if she does a lot of typing.    Bladder function is ok with mild frequency.     Vision is doing well.  She has some fatigue.   She sleeps well most nights.    She denies depression but notes some anxiety.   She takes lorazepam prn.   No recent panic attacks.   She deneis any cognitive issues.    Her last MRis were from 10/2016 and showed  no new lesions.  She was diagnosed with PCOS and placed on hormone therapy.  Update 01/30/2018: Her MS has done very well on Tysabri. She has not had any exacerbations. She tolerates the medication well. Her JCV antibody was last tested 07/24/2017. It was negative at 0.11 at that time..       She has urinary frequency but no incontinence.    Vision is fine.     She has some fatigue but is able to do everthing that she wants to do.    She denies depression but notes generalized anxiety.   There are no panic attacks.   She takes lorazepam just 2 x a month on average.    She has some insomnia, also has some nocturia, twice most nights. She denies any difficulty with her cognition.  Her headaches are doing better.   She took imipramine.   HA improved but not sure if due to imipramine..   Imipramine helped her sleep and nocturia. She stopped after a few weeks.      From 07/24/2017:  MS:  She is on Tysabri and tolerates it well.  She is JCV Ab negative (0.08  on 01/24/2017)   She denies any new exacerbation.   Cervical spine and brain November 2017 was personally reviewed. There were no new lesions. The previously enhancing lesions were smaller and no longer enhanced.  Headaches:   She is still getting frequent headaches. (>25/30 a month).   Pain is bilateral and is usually mild to moderate.   It builds up daily at work.  HA is sometimes present when she wakes up though.  She gets a pressure sensation.  HA is usually milder the next morning.   It only seems to occur when she tries to go to sleep.  No nausea or vomiting.   Moving does not alter the pain much.   She denies occipital pain.    She notes pain in the back of the head/upper neck as well.   She does not take anything for the headache.  She is not sure if imipramine helped any when she tried in the past.  Gait/strength/sensation:   Her gait is back to baseline. She denies any weakness or spasticity in the arms or legs. There is no numbness.     Bladder/bowel:  She has urinary frequency only at night.    She has 3-4 times nocturia many nights.   No incontinence.  No recent UTI's.   No hesitancy.   Bowel is fine  Vision/ears:  Diplopia resolved. She denies any history of optic neuritis. She does not have any visual acuity issues or problems with color vision..  Fatigue/sleep:   She reports mild fatigue. This is worse with heat. She is working full-time.      She wakes up some at night but usually falls back asleep.   he is sleeping worse with frequent awakenings for nocturia.     Imipramine helped but caused a hangover.     Mood/Cognition:   She notes some anxiety but no depression.   She denies any significant cognitive change.   MS History:   In retrospect, she had an episode of vertigo in 2014 with mild vision changes that improved.    In March 2016, she had vertigo that improved after a couple weeks.  In late April, she had  numbness in the left foot. When she woke up the next morning, she noted that the numbness was higher up the left arm and the next day it also involved the left hand and she went to the emergency room. In the emergency room, she had an MRI of the brain that showed white matter foci were some for multiple sclerosis.   MRI of the brain with and without contrast 03/29/15 showed several posterior fossa lesions with one in the right middle cerebellar peduncle and several in the pons.   The middle cerebellar peduncle focus and 2 of the pontine foci enhanced. There are also several periventricular and deep white matter foci in the cerebral hemispheres.   MRI cervical spine shows one enhancing focus.  Repeat imaging of the brain and cervical spine November 2017 did not show any new lesions. The previously enhancing lesions were smaller and did not enhance.Marland Kitchen       REVIEW OF SYSTEMS: Constitutional: No fevers, chills, sweats, or change in appetite.   She notes some fatigue. Eyes: No visual changes, double vision, eye pain Ear,  nose and throat: No hearing loss, ear pain, nasal congestion, sore throat Cardiovascular: No chest pain, palpitations Respiratory: No shortness of breath at rest or with exertion.   No wheezes GastrointestinaI: No nausea, vomiting, diarrhea, abdominal  pain, fecal incontinence Genitourinary: Notes frequency, nocturia (x2).   She has PCOS Musculoskeletal: No neck pain, back pain Integumentary: No rash, pruritus, skin lesions Neurological: as above Psychiatric: as above  Endocrine: No palpitations, diaphoresis, change in appetite, change in weigh or increased thirst Hematologic/Lymphatic: No anemia, purpura, petechiae. Allergic/Immunologic: No itchy/runny eyes, nasal congestion, recent allergic reactions, rashes  ALLERGIES: No Known Allergies  HOME MEDICATIONS:  Current Outpatient Medications:  .  cephALEXin (KEFLEX) 500 MG capsule, Take 1 capsule (500 mg total) by mouth 4 (four) times daily for 5 days., Disp: 20 capsule, Rfl: 0 .  cholecalciferol (VITAMIN D) 1000 units tablet, Take 1,000 Units by mouth daily., Disp: , Rfl:  .  LORazepam (ATIVAN) 0.5 MG tablet, Take 1 tablet (0.5 mg total) by mouth 2 (two) times daily as needed for anxiety., Disp: 15 tablet, Rfl: 1 .  natalizumab (TYSABRI) 300 MG/15ML injection, Inject 15 mLs into the vein every 30 (thirty) days. , Disp: , Rfl:  .  Norethindrone Acetate-Ethinyl Estradiol (LOESTRIN 1.5/30, 21,) 1.5-30 MG-MCG tablet, Take 1 tablet by mouth daily., Disp: 3 Package, Rfl: 2 .  vitamin C (ASCORBIC ACID) 500 MG tablet, Take 500 mg by mouth daily., Disp: , Rfl:  No current facility-administered medications for this visit.   Facility-Administered Medications Ordered in Other Visits:  .  gadopentetate dimeglumine (MAGNEVIST) injection 18 mL, 18 mL, Intravenous, Once PRN, Tracey Lindsey, Pearletha Furl, MD  PAST MEDICAL HISTORY: Past Medical History:  Diagnosis Date  . Anxiety   . Chlamydia   . History of abnormal cervical Papanicolaou smear   . MS  (multiple sclerosis) [facility]) April 2016    PAST SURGICAL HISTORY: History reviewed. No pertinent surgical history.  FAMILY HISTORY: Family History  Problem Relation Age of Onset  . Diabetes Father   . Hypertension Father   . Stroke Father   . Cancer Mother        lung  . Hyperlipidemia Mother   . Lung disease Mother   . Eczema Brother     SOCIAL HISTORY:  Social History   Socioeconomic History  . Marital status: Single    Spouse name: Not on file  . Number of children: Not on file  . Years of education: Not on file  . Highest education level: Not on file  Occupational History  . Occupation: unemployeed   Social Needs  . Financial resource strain: Not on file  . Food insecurity:    Worry: Not on file    Inability: Not on file  . Transportation needs:    Medical: Not on file    Non-medical: Not on file  Tobacco Use  . Smoking status: Never Smoker  . Smokeless tobacco: Never Used  Substance and Sexual Activity  . Alcohol use: Yes    Comment: occasionally  . Drug use: Never  . Sexual activity: Yes    Partners: Male    Birth control/protection: Condom  Lifestyle  . Physical activity:    Days per week: Not on file    Minutes per session: Not on file  . Stress: Not on file  Relationships  . Social connections:    Talks on phone: Not on file    Gets together: Not on file    Attends religious service: Not on file    Active member of club or organization: Not on file    Attends meetings of clubs or organizations: Not on file    Relationship status: Not on file  . Intimate partner violence:  Fear of current or ex partner: Not on file    Emotionally abused: Not on file    Physically abused: Not on file    Forced sexual activity: Not on file  Other Topics Concern  . Not on file  Social History Narrative   Lives with mother    Drinks soda daily (8-10 ounces)     PHYSICAL EXAM  Vitals:   02/02/19 1140  BP: 110/78  Pulse: 75  SpO2: 98%  Weight: 234 lb  8 oz (106.4 kg)    Body mass index is 41.54 kg/m.   General: The patient is well-developed and well-nourished and in no acute distress   Neurologic Exam  Mental status: The patient is alert and oriented x 3 at the time of the examination. The patient has apparent normal recent and remote memory, with an apparently normal attention span and concentration ability.   Speech is normal.  Cranial nerves:   Extraocular muscles are intact.  Pupils react normally.,  Color vision is normal.  Facial strength and sensation normal. Trapezius strength is strong.. No obvious hearing deficits are noted.  Motor:  Muscle bulk is normal.   Muscle tone is normal.  Strength is 5/5 in the arms and legs.  No drift.  Sensory:   She has normal touch and vibration sensation in the arms and legs.  Coordination: Cerebellar testing reveals good finger-nose-finger bilaterally.  Heel-to-shin is performed well.  Gait and station: Station is normal.   Gait is normal. Tandem gait is very mildly wide. Romberg is negative now.   Reflexes: Deep tendon reflexes are symmetric and increased at the knees with spread.  There is no ankle clonus.Marland Kitchen    DIAGNOSTIC DATA (LABS, IMAGING, TESTING) - I reviewed patient records, labs, notes, testing and imaging myself where available.  Lab Results  Component Value Date   WBC 10.2 07/31/2018   HGB 13.6 07/31/2018   HCT 41.1 07/31/2018   MCV 90 07/31/2018   PLT 255 07/31/2018      Component Value Date/Time   NA 141 04/24/2018 1138   NA 138 11/14/2015 1417   K 3.6 04/24/2018 1138   CL 105 04/24/2018 1138   CO2 27 04/24/2018 1138   GLUCOSE 87 04/24/2018 1138   BUN 11 04/24/2018 1138   BUN 9 11/14/2015 1417   CREATININE 0.75 04/24/2018 1138   CALCIUM 9.7 04/24/2018 1138   PROT 6.7 04/24/2018 1138   PROT 6.7 11/14/2015 1417   ALBUMIN 4.6 11/14/2015 1417   AST 17 04/24/2018 1138   ALT 15 04/24/2018 1138   ALKPHOS 48 11/14/2015 1417   BILITOT 0.5 04/24/2018 1138    BILITOT 0.5 11/14/2015 1417   GFRNONAA 110 04/24/2018 1138   GFRAA 127 04/24/2018 1138   No results found.    ASSESSMENT AND PLAN  MS (multiple sclerosis) (HCC) - Plan: Stratify JCV Antibody Test ([facility]), CBC with Differential/Platelet, VITAMIN D 25 Hydroxy (Vit-D Deficiency, Fractures)  High risk medication use - Plan: Stratify JCV Antibody Test ([facility]), CBC with Differential/Platelet  Vitamin D deficiency - Plan: VITAMIN D 25 Hydroxy (Vit-D Deficiency, Fractures)  Ataxic gait  Other fatigue  Anxiety state  Depression with anxiety   1.   She will continue Tysabri.  I will check a JCV antibody and CBC with differential today.  Also check Vit D    2.   Will take lorazepam rarely for anxiety. . 3.  Stay active and exercise as tolerated.  4.   She will return to see  me in 6 months for regular visits and call sooner if she has new or worsening symptoms.    Shalondra Wunschel A. Epimenio Foot, MD, PhD 02/02/2019, 3:36 PM Certified in Neurology, Clinical Neurophysiology, Sleep Medicine, Pain Medicine and Neuroimaging  [facility] [address] [place], [place] [address] [phone]

## 2019-02-02 NOTE — Telephone Encounter (Signed)
Placed JCV lab in [facility] lock box for routine lab pick up.  

## 2019-02-03 ENCOUNTER — Ambulatory Visit (INDEPENDENT_AMBULATORY_CARE_PROVIDER_SITE_OTHER): Payer: 59 | Admitting: Bariatrics

## 2019-02-03 ENCOUNTER — Encounter (INDEPENDENT_AMBULATORY_CARE_PROVIDER_SITE_OTHER): Payer: Self-pay | Admitting: Bariatrics

## 2019-02-03 ENCOUNTER — Telehealth: Payer: Self-pay | Admitting: *Deleted

## 2019-02-03 VITALS — BP 112/77 | HR 72 | Temp 98.1°F | Ht 63.0 in | Wt 231.0 lb

## 2019-02-03 DIAGNOSIS — Z6841 Body Mass Index (BMI) 40.0 and over, adult: Secondary | ICD-10-CM

## 2019-02-03 DIAGNOSIS — R5383 Other fatigue: Secondary | ICD-10-CM | POA: Diagnosis not present

## 2019-02-03 DIAGNOSIS — F3289 Other specified depressive episodes: Secondary | ICD-10-CM

## 2019-02-03 DIAGNOSIS — G35 Multiple sclerosis: Secondary | ICD-10-CM

## 2019-02-03 DIAGNOSIS — E559 Vitamin D deficiency, unspecified: Secondary | ICD-10-CM

## 2019-02-03 DIAGNOSIS — E282 Polycystic ovarian syndrome: Secondary | ICD-10-CM | POA: Diagnosis not present

## 2019-02-03 DIAGNOSIS — Z9189 Other specified personal risk factors, not elsewhere classified: Secondary | ICD-10-CM

## 2019-02-03 DIAGNOSIS — R0602 Shortness of breath: Secondary | ICD-10-CM

## 2019-02-03 DIAGNOSIS — Z0289 Encounter for other administrative examinations: Secondary | ICD-10-CM

## 2019-02-03 LAB — CBC WITH DIFFERENTIAL/PLATELET
BASOS: 1 %
Basophils Absolute: 0.1 10*3/uL (ref 0.0–0.2)
EOS (ABSOLUTE): 0.3 10*3/uL (ref 0.0–0.4)
Eos: 3 %
HEMATOCRIT: 39.6 % (ref 34.0–46.6)
HEMOGLOBIN: 13.5 g/dL (ref 11.1–15.9)
IMMATURE GRANS (ABS): 0 10*3/uL (ref 0.0–0.1)
Immature Granulocytes: 0 %
LYMPHS: 41 %
Lymphocytes Absolute: 4 10*3/uL — ABNORMAL HIGH (ref 0.7–3.1)
MCH: 30.9 pg (ref 26.6–33.0)
MCHC: 34.1 g/dL (ref 31.5–35.7)
MCV: 91 fL (ref 79–97)
Monocytes Absolute: 0.5 10*3/uL (ref 0.1–0.9)
Monocytes: 5 %
NEUTROS ABS: 4.8 10*3/uL (ref 1.4–7.0)
Neutrophils: 50 %
PLATELETS: 285 10*3/uL (ref 150–450)
RBC: 4.37 x10E6/uL (ref 3.77–5.28)
RDW: 12.9 % (ref 11.7–15.4)
WBC: 9.7 10*3/uL (ref 3.4–10.8)

## 2019-02-03 LAB — VITAMIN D 25 HYDROXY (VIT D DEFICIENCY, FRACTURES): Vit D, 25-Hydroxy: 31.9 ng/mL (ref 30.0–100.0)

## 2019-02-03 NOTE — Telephone Encounter (Signed)
-----   Message from Asa Lente, MD sent at 02/03/2019  9:49 AM EST ----- The vitamin D is low normal.  She should take 5000 units over-the-counter daily.

## 2019-02-03 NOTE — Progress Notes (Signed)
Office: [phone]  /  Fax: [phone]   Dear Dr. Sherie Don,   Thank you for referring Tracey Lindsey to our clinic. The following note includes my evaluation and treatment recommendations.  HPI:   Chief Complaint: OBESITY    Tracey Lindsey has been referred by Tracey Passey, MD for consultation regarding her obesity and obesity related comorbidities.    Tracey Lindsey (MR# [id number]) is a 28 y.o. female who presents on 02/03/2019 for obesity evaluation and treatment. Current BMI is Body mass index is 40.92 kg/m.Tracey Lindsey has been struggling with her weight for many years and has been unsuccessful in either losing weight, maintaining weight loss, or reaching her healthy weight goal.     Tracey Lindsey attended our information session and states she is currently in the action stage of change and ready to dedicate time achieving and maintaining a healthier weight. Tracey Lindsey is interested in becoming our patient and working on intensive lifestyle modifications including (but not limited to) diet, exercise and weight loss.    Tracey Lindsey states her desired weight loss is 71 lbs. she started gaining weight after entering a toxic relationship her heaviest weight ever was 239 lbs. she eats out frequently; approximately 10 times a week she does like to cook she craves sweets and fried foods  she snacks on ice cream and desserts she skips meals frequently she is frequently drinking liquids with calories she frequently makes poor food choices she frequently eats larger portions than normal  she has binge eating behaviors she struggles with emotional eating    Tracey Lindsey feels her energy is lower than it should be. This has worsened with weight gain and has not worsened recently. Tracey Lindsey admits to daytime somnolence and  admits to waking up still tired. Patient is at risk for obstructive sleep apnea. Tracey Lindsey has a history of symptoms of daytime Tracey and morning Tracey. Patient generally  gets 6 to 8 hours of sleep per night, and states they generally have restless sleep. Snoring is not present. Apneic episodes are not present. Epworth Sleepiness Score is 5  Dyspnea on exertion Tracey Lindsey notes increasing shortness of breath with certain activities and seems to be worsening over time with weight gain. She notes getting out of breath sooner with activity than she used to. This has not gotten worse recently. 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 She denies orthopnea.  MS (multiple sclerosis) Livy has a diagnosis of MS (diagnosed 2016) and she is currently taking Tysabri monthly. There are no new lesions.  Vitamin D deficiency Tracey Lindsey has a diagnosis of vitamin D deficiency. Her last vitamin D level was at 31.9 She is currently taking OTC vit D and denies nausea, vomiting or muscle weakness.  At risk for osteopenia and osteoporosis Tracey Lindsey is at higher risk of osteopenia and osteoporosis due to vitamin D deficiency.   PCOS (polycystic ovarian syndrome) Tracey Lindsey has a diagnosis of PCOS and she had been taking birth control only.  Depression with emotional eating behaviors Tracey Lindsey is struggling with emotional eating and using food for comfort to the extent that it is negatively impacting her health. She often snacks when she is not hungry. Tracey Lindsey sometimes feels she is out of control and then feels guilty that she made poor food choices. She is attempting to work on behavior modification techniques to help reduce her emotional eating. She shows no sign of suicidal or homicidal ideations.  Depression Screen Tracey Lindsey's Food and Mood (modified PHQ-9) score was  Depression screen PHQ  2/9 02/03/2019  Decreased Interest 3  Down, Depressed, Hopeless 3  PHQ - 2 Score 6  Altered sleeping 2  Tired, decreased energy 3  Change in appetite 3  Feeling bad or failure about yourself  1  Trouble concentrating 0  Suicidal thoughts 0  PHQ-9 Score 15  Difficult doing work/chores  Somewhat difficult    ASSESSMENT AND PLAN:  Other Tracey - Plan: EKG 12-Lead, T3, T4, free, TSH  Shortness of breath on exertion - Plan: Lipid Panel With LDL/HDL Ratio  Multiple sclerosis (HCC)  Vitamin D deficiency  PCOS (polycystic ovarian syndrome) - Plan: Comprehensive metabolic panel, Hemoglobin A1c, Insulin, random  Other depression - with emotional eating  At risk for osteoporosis  Class 3 severe obesity with serious comorbidity and body mass index (BMI) of 40.0 to 44.9 in adult, unspecified obesity type (HCC)  PLAN:  Tracey Joannamarie was informed that her Tracey may be related to obesity, depression or many other causes. Labs will be ordered, and in the meanwhile Collin has agreed to work on diet, exercise and weight loss to help with Tracey. Proper sleep hygiene was discussed including the need for 7-8 hours of quality sleep each night. A sleep study was not ordered based on symptoms and Epworth score.  Dyspnea on exertion Tracey Lindsey's shortness of breath appears to be obesity related and exercise induced. She will do exercise/activity, that she can safely do to treat her exercise induced shortness of breath. If Tracey Lindsey follows our instructions and loses weight without improvement of her shortness of breath, we will plan to refer to pulmonology. We will monitor this condition regularly. Tracey Lindsey agrees to this plan.  MS (multiple sclerosis) Tracey Lindsey will follow up with the neurologist two times a year. She will follow up with our clinic at the agreed upon time.  Vitamin D Deficiency Tracey Lindsey was informed that low vitamin D levels contributes to Tracey and are associated with obesity, breast, and colon cancer. She agrees to start prescription Vit D @50 ,000 IU every week #4 with no refills  and will follow up for routine testing of vitamin D, at least 2-3 times per year. She was informed of the risk of over-replacement of vitamin D and agrees to not increase her dose  unless she discusses this with [place] first. Tracey Lindsey agrees to follow up as directed.  At risk for osteopenia and osteoporosis Tracey Lindsey was given extended  (15 minutes) osteoporosis prevention counseling today. Tracey Lindsey is at risk for osteopenia and osteoporosis due to her vitamin D deficiency. She was encouraged to take her vitamin D and follow her higher calcium diet and increase strengthening exercise to help strengthen her bones and decrease her risk of osteopenia and osteoporosis.  PCOS (polycystic ovarian syndrome) Tracey Lindsey will follow up with her OB/GYN. She will follow up with our clinic at the agreed upon time.  Depression with Emotional Eating Behaviors We discussed behavior modification techniques today to help Tracey Lindsey deal with her emotional eating and depression. We will refer Tracey Lindsey to Dr. Dewaine Conger our bariatric psychologist.  Depression Screen Lisha had a strongly positive depression screening. Depression is commonly associated with obesity and often results in emotional eating behaviors. We will monitor this closely and work on CBT to help improve the non-hunger eating patterns.   Obesity Tahjae is currently in the action stage of change and her goal is to continue with weight loss efforts. I recommend Valleri begin the structured treatment plan as follows:  She has agreed to follow the Category 3 plan Tracey Lindsey  has been instructed to eventually work up to a goal of 150 minutes of combined cardio and strengthening exercise per week for weight loss and overall health benefits. We discussed the following Behavioral Modification Strategies today: more healthy snacks, increase H2O intake, no skipping meals, keeping healthy foods in the home, increasing lean protein intake, decreasing simple carbohydrates, increasing vegetables, decrease eating out and work on meal planning and easy cooking plans   She was informed of the importance of frequent follow up visits to maximize her  success with intensive lifestyle modifications for her multiple health conditions. She was informed we would discuss her lab results at her next visit unless there is a critical issue that needs to be addressed sooner. Tracey Lindsey agreed to keep her next visit at the agreed upon time to discuss these results.  ALLERGIES: No Known Allergies  MEDICATIONS: Current Outpatient Medications on File Prior to Visit  Medication Sig Dispense Refill  . cholecalciferol (VITAMIN D) 1000 units tablet Take 1,000 Units by mouth daily.    Marland Kitchen LORazepam (ATIVAN) 0.5 MG tablet Take 1 tablet (0.5 mg total) by mouth 2 (two) times daily as needed for anxiety. 15 tablet 1  . natalizumab (TYSABRI) 300 MG/15ML injection Inject 15 mLs into the vein every 30 (thirty) days.     . vitamin C (ASCORBIC ACID) 500 MG tablet Take 500 mg by mouth daily.    . Norethindrone Acetate-Ethinyl Estradiol (LOESTRIN 1.5/30, 21,) 1.5-30 MG-MCG tablet Take 1 tablet by mouth daily. (Patient not taking: Reported on 02/03/2019) 3 Package 2   Current Facility-Administered Medications on File Prior to Visit  Medication Dose Route Frequency Provider Last Rate Last Dose  . gadopentetate dimeglumine (MAGNEVIST) injection 18 mL  18 mL Intravenous Once PRN Sater, Pearletha Furl, MD        PAST MEDICAL HISTORY: Past Medical History:  Diagnosis Date  . Anxiety   . Chlamydia   . Depression   . History of abnormal cervical Papanicolaou smear   . MS (multiple sclerosis) [facility]) April 2016  . PCOS (polycystic ovarian syndrome)   . Vitamin D deficiency     PAST SURGICAL HISTORY: History reviewed. No pertinent surgical history.  SOCIAL HISTORY: Social History   Tobacco Use  . Smoking status: Never Smoker  . Smokeless tobacco: Never Used  Substance Use Topics  . Alcohol use: Yes    Comment: occasionally  . Drug use: Never    FAMILY HISTORY: Family History  Problem Relation Age of Onset  . Diabetes Father   . Hypertension Father   . Stroke  Father   . Cancer Mother        lung  . Hyperlipidemia Mother   . Lung disease Mother   . Eczema Brother     ROS: Review of Systems  Constitutional: Positive for malaise/Tracey.  Respiratory: Positive for shortness of breath (on exertion).   Cardiovascular: Negative for orthopnea.  Gastrointestinal: Negative for nausea and vomiting.  Musculoskeletal:       Negative for muscle weakness  Skin: Positive for itching and rash.  Neurological: Positive for tremors.  Psychiatric/Behavioral: Positive for depression. Negative for suicidal ideas. The patient has insomnia.     PHYSICAL EXAM: Blood pressure 112/77, pulse 72, temperature 98.1 F (36.7 C), temperature source Oral, height  (1.6 m), weight 231 lb (104.8 kg), last menstrual period 01/09/2019, SpO2 99 %. Body mass index is 40.92 kg/m. Physical Exam Vitals signs reviewed.  Constitutional:      Appearance: Normal appearance. She is  well-developed. She is obese.  HENT:     Head: Normocephalic and atraumatic.     Nose: Nose normal.     Mouth/Throat:     Comments: Mallampati = 1 Eyes:     General: No scleral icterus.    Extraocular Movements: Extraocular movements intact.  Neck:     Musculoskeletal: Normal range of motion and neck supple.     Thyroid: No thyromegaly.  Cardiovascular:     Rate and Rhythm: Normal rate and regular rhythm.  Pulmonary:     Effort: Pulmonary effort is normal. No respiratory distress.  Abdominal:     Palpations: Abdomen is soft.     Tenderness: There is no abdominal tenderness.  Musculoskeletal: Normal range of motion.     Comments: Range of Motion normal in all 4 extremities  Skin:    General: Skin is warm and dry.     Comments: Acanthosis Nigricans  Neurological:     Mental Status: She is alert and oriented to person, place, and time.     Coordination: Coordination normal.  Psychiatric:        Mood and Affect: Mood normal.        Behavior: Behavior normal.        Thought Content:  Thought content does not include homicidal or suicidal ideation.     RECENT LABS AND TESTS: BMET    Component Value Date/Time   NA 141 04/24/2018 1138   NA 138 11/14/2015 1417   K 3.6 04/24/2018 1138   CL 105 04/24/2018 1138   CO2 27 04/24/2018 1138   GLUCOSE 87 04/24/2018 1138   BUN 11 04/24/2018 1138   BUN 9 11/14/2015 1417   CREATININE 0.75 04/24/2018 1138   CALCIUM 9.7 04/24/2018 1138   GFRNONAA 110 04/24/2018 1138   GFRAA 127 04/24/2018 1138   No results found for: HGBA1C No results found for: INSULIN CBC    Component Value Date/Time   WBC 9.7 02/02/2019 1201   WBC 8.4 04/24/2018 1138   RBC 4.37 02/02/2019 1201   RBC 4.60 04/24/2018 1138   HGB 13.5 02/02/2019 1201   HCT 39.6 02/02/2019 1201   PLT 285 02/02/2019 1201   MCV 91 02/02/2019 1201   MCH 30.9 02/02/2019 1201   MCH 30.4 04/24/2018 1138   MCHC 34.1 02/02/2019 1201   MCHC 34.2 04/24/2018 1138   RDW 12.9 02/02/2019 1201   LYMPHSABS 4.0 (H) 02/02/2019 1201   MONOABS 0.3 03/31/2015 1015   EOSABS 0.3 02/02/2019 1201   BASOSABS 0.1 02/02/2019 1201   Iron/TIBC/Ferritin/ %Sat No results found for: IRON, TIBC, FERRITIN, IRONPCTSAT Lipid Panel     Component Value Date/Time   CHOL 200 (H) 04/24/2018 1138   CHOL 175 11/14/2015 1417   TRIG 97 04/24/2018 1138   HDL 49 (Lindsey) 04/24/2018 1138   HDL 52 11/14/2015 1417   CHOLHDL 4.1 04/24/2018 1138   LDLCALC 131 (H) 04/24/2018 1138   Hepatic Function Panel     Component Value Date/Time   PROT 6.7 04/24/2018 1138   PROT 6.7 11/14/2015 1417   ALBUMIN 4.6 11/14/2015 1417   AST 17 04/24/2018 1138   ALT 15 04/24/2018 1138   ALKPHOS 48 11/14/2015 1417   BILITOT 0.5 04/24/2018 1138   BILITOT 0.5 11/14/2015 1417      Component Value Date/Time   TSH 1.780 07/24/2018 1441   TSH 1.29 04/24/2018 1138   Results for Goerner, Tracey Lindsey (MRN [id number]) as of 02/03/2019 14:01  Ref. Range 02/02/2019 12:01  Vitamin  D, 25-Hydroxy Latest Ref Range: 30.0 - 100.0 ng/mL 31.9     ECG  shows NSR with a rate of 78 BPM INDIRECT CALORIMETER done today shows a VO2 of 271 and a REE of 1884.  Her calculated basal metabolic rate is 1610 thus her basal metabolic rate is better than expected.   OBESITY BEHAVIORAL INTERVENTION VISIT  Today's visit was # 1   Starting weight: 231 lbs Starting date: 02/03/2019 Today's weight : 231 lbs  Today's date: 02/03/2019 Total lbs lost to date: 0    02/03/2019  Height  (1.6 m)  Weight 231 lb (104.8 kg)  BMI (Calculated) 40.93  BLOOD PRESSURE - SYSTOLIC 112  BLOOD PRESSURE - DIASTOLIC 77  Waist Measurement  41 inches   Body Fat % 46.1 %  Total Body Water (lbs) 89.4 lbs  RMR 1884    ASK: We discussed the diagnosis of obesity with Antionette Poles today and Courtland agreed to give [place] permission to discuss obesity behavioral modification therapy today.  ASSESS: Meela has the diagnosis of obesity and her BMI today is 40.93 Infantof is in the action stage of change   ADVISE: Maxwell was educated on the multiple health risks of obesity as well as the benefit of weight loss to improve her health. She was advised of the need for Madia term treatment and the importance of lifestyle modifications to improve her current health and to decrease her risk of future health problems.  AGREE: Multiple dietary modification options and treatment options were discussed and  Adelisa agreed to follow the recommendations documented in the above note.  ARRANGE: Anaia was educated on the importance of frequent visits to treat obesity as outlined per CMS and USPSTF guidelines and agreed to schedule her next follow up appointment today.  Cristi Loron, am acting as Energy manager for [employer]. Manson Passey, DO  I have reviewed the above documentation for accuracy and completeness, and I agree with the above. -Corinna Capra, DO

## 2019-02-03 NOTE — Telephone Encounter (Signed)
Called, LVM for pt to call about results.  

## 2019-02-04 LAB — T4, FREE: Free T4: 1.27 ng/dL (ref 0.82–1.77)

## 2019-02-04 LAB — COMPREHENSIVE METABOLIC PANEL
ALK PHOS: 45 IU/L (ref 39–117)
ALT: 11 IU/L (ref 0–32)
AST: 18 IU/L (ref 0–40)
Albumin/Globulin Ratio: 1.9 (ref 1.2–2.2)
Albumin: 4.4 g/dL (ref 3.9–5.0)
BUN / CREAT RATIO: 10 (ref 9–23)
BUN: 8 mg/dL (ref 6–20)
Bilirubin Total: 0.3 mg/dL (ref 0.0–1.2)
CO2: 22 mmol/L (ref 20–29)
Calcium: 9.3 mg/dL (ref 8.7–10.2)
Chloride: 103 mmol/L (ref 96–106)
Creatinine, Ser: 0.79 mg/dL (ref 0.57–1.00)
GFR calc Af Amer: 119 mL/min/{1.73_m2} (ref >59)
GFR calc non Af Amer: 103 mL/min/{1.73_m2} (ref >59)
Globulin, Total: 2.3 g/dL (ref 1.5–4.5)
Glucose: 86 mg/dL (ref 65–99)
Potassium: 4.3 mmol/L (ref 3.5–5.2)
SODIUM: 138 mmol/L (ref 134–144)
Total Protein: 6.7 g/dL (ref 6.0–8.5)

## 2019-02-04 LAB — INSULIN, RANDOM: INSULIN: 14.9 u[IU]/mL (ref 2.6–24.9)

## 2019-02-04 LAB — HEMOGLOBIN A1C
Est. average glucose Bld gHb Est-mCnc: 111 mg/dL
HEMOGLOBIN A1C: 5.5 % (ref 4.8–5.6)

## 2019-02-04 LAB — LIPID PANEL WITH LDL/HDL RATIO
Cholesterol, Total: 205 mg/dL — ABNORMAL HIGH (ref 100–199)
HDL: 44 mg/dL (ref >39)
LDL CALC: 136 mg/dL — AB (ref 0–99)
LDL/HDL RATIO: 3.1 ratio (ref 0.0–3.2)
Triglycerides: 125 mg/dL (ref 0–149)
VLDL Cholesterol Cal: 25 mg/dL (ref 5–40)

## 2019-02-04 LAB — TSH: TSH: 2.44 u[IU]/mL (ref 0.450–4.500)

## 2019-02-04 LAB — T3: T3, Total: 154 ng/dL (ref 71–180)

## 2019-02-04 NOTE — Telephone Encounter (Signed)
I called and spoke with pt about lab results per Dr. Epimenio Foot note. She verbalized understanding. Added Vit D po 5000U daily to med list.

## 2019-02-04 NOTE — Telephone Encounter (Signed)
Called, LVM for pt again to call about results

## 2019-02-04 NOTE — Telephone Encounter (Signed)
Pt returned call and would like a call back at [phone].

## 2019-02-05 DIAGNOSIS — G35 Multiple sclerosis: Secondary | ICD-10-CM | POA: Diagnosis not present

## 2019-02-09 NOTE — Telephone Encounter (Signed)
JCV ab negative, index: 0.13. Drawn on 02/02/19.

## 2019-02-16 NOTE — Progress Notes (Signed)
Office: [phone]  /  Fax: [phone]    Date: 02/17/2019  Time Seen: 3:02pm Duration: 45 minutes Provider: Glennie Isle, PsyD Type of Session: Intake for Individual Therapy  Type of Contact: Face-to-face  Informed Consent:The provider's role was explained to [employer]. The provider reviewed and discussed issues of confidentiality, privacy, and limits therein. In addition to verbal informed consent, written informed consent for psychological services was obtained from [place] prior to the initial intake interview. Written consent included information concerning the practice, financial arrangements, and confidentiality and patients' rights. Since the clinic is not a 24/7 crisis center, mental health emergency resources were shared in the form of a handout, and the provider explained MyChart, e-mail, voicemail, and/or other messaging systems should be utilized only for non-emergency reasons. Evolet verbally acknowledged understanding of the aforementioned, and agreed to use mental health emergency resources discussed if needed. Notably, due to uncertainty related to the coronavirus, this provider and Aaren discussed using the aforementioned emergency resources should this provider's office be closed. Raizy expressed understanding, and was agreeable to using the resources if needed. Moreover, Bernardette agreed information may be shared with other [facility] providers as needed for coordination of care, and written consent was obtained.   Chief Complaint: Haiven was referred by Dr. Jearld Lesch due to depression with emotional eating behaviors. Per the note for the visit with Dr. Jearld Lesch on 02/03/2019, "Melvine is struggling with emotional eating and using food for comfort to the extent that it is negatively impacting her health. She often snacks when she is not hungry. Alanya sometimes feels she is out of control and then feels guilty that she made poor  food choices. She is attempting to work on behavior modification techniques to help reduce her emotional eating. She shows no sign of suicidal or homicidal ideations."  During today's appointment, Areanna reported, "I sometimes eat based on how I am feeling." She noted her last episode of emotional eating was 4-5 weeks ago due to "being generally sad due to the loss of a relationship." Alva explained, "We were together on and off for 13 years." She recalled eating "a lot of sweets." Alekhya explained she tends to crave sweets. Regarding the structured meal plan, Latika stated, "I did really well the first week, but events came up."   Aleiah was asked to complete a questionnaire assessing various behaviors related to emotional eating. Amyri endorsed the following: eat certain foods when you are anxious, stressed, depressed, or your feelings are hurt, find food is comforting to you and not worry about what you eat when you are in a good mood.  HPI: Per the note for the initial visit with Dr. Jearld Lesch on 02/03/2019, Joelene Millin craves sweets and fried foods. In addition, she snacks on ice cream and desserts. During the initial appointment with Dr. Jearld Lesch, Joelene Millin further reported experiencing the following: frequently drinking liquids with calories, frequently making poor food choices, frequently eating larger portions than normal , binge eating behaviors and struggling with emotional eating.   During today's appointment, Raylynne reported a history of gaining weight during relationships, and losing weight when single. She described the onset of emotional eating was likely in college around 2010. She denied there being any significant events during that time. Mae denied a history of binge eating. Mao denied a history of purging and engagement in other compensatory strategies, and has never been diagnosed with an eating disorder.   Mental Status Examination: Destyni arrived on  time  for the appointment. She presented as appropriately dressed and groomed. Gloristine appeared her stated age and demonstrated adequate orientation to time, place, person, and purpose of the appointment. She also demonstrated appropriate eye contact. No psychomotor abnormalities or behavioral peculiarities noted. Her mood was euthymic with congruent affect. Notably, she was observed becoming tearful when discussing her past relationship. Her thought processes were logical, linear, and goal-directed. No hallucinations, delusions, bizarre thinking or behavior reported or observed. Judgment, insight, and impulse control appeared to be grossly intact. There was no evidence of paraphasias (i.e., errors in speech, gross mispronunciations, and word substitutions), repetition deficits, or disturbances in volume or prosody (i.e., rhythm and intonation). There was no evidence of attention or memory impairments. Stephanye denied current suicidal and homicidal ideation, plan, and intent.   Family & Psychosocial History: Kaelyn shared she is not in a relationship and does not have any children. 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 Tiffiany stated her highest level of education is a Brewing technologist degree in social work. Currently, her social support consists of her two best friends, mother, father, and three siblings. She stated she identifies with Christianity, and does not attend church on a "regular basis."   Medical History:  Past Medical History:  Diagnosis Date   Anxiety    Chlamydia    Depression    History of abnormal cervical Papanicolaou smear    MS (multiple sclerosis) ([place]) April 2016   PCOS (polycystic ovarian syndrome)    Vitamin D deficiency    No past surgical history on file. Current Outpatient Medications on File Prior to Visit  Medication Sig Dispense Refill   cholecalciferol (VITAMIN D) 1000 units tablet Take 1,000  Units by mouth daily.     LORazepam (ATIVAN) 0.5 MG tablet Take 1 tablet (0.5 mg total) by mouth 2 (two) times daily as needed for anxiety. 15 tablet 1   natalizumab (TYSABRI) 300 MG/15ML injection Inject 15 mLs into the vein every 30 (thirty) days.      Norethindrone Acetate-Ethinyl Estradiol (LOESTRIN 1.5/30, 21,) 1.5-30 MG-MCG tablet Take 1 tablet by mouth daily. (Patient not taking: Reported on 02/03/2019) 3 Package 2   vitamin C (ASCORBIC ACID) 500 MG tablet Take 500 mg by mouth daily.     VITAMIN D PO Take 5,000 Units by mouth daily.     Current Facility-Administered Medications on File Prior to Visit  Medication Dose Route Frequency Provider Last Rate Last Dose   gadopentetate dimeglumine (MAGNEVIST) injection 18 mL  18 mL Intravenous Once PRN Sater, Nanine Means, MD      Joelene Millin denied a history of head injuries and loss of consciousness.   Mental Health History: Ambriella shared she first received therapeutic services in 2016 in the form of individual therapy. She explained it was only for one session due to relationship issues. She re-initiated services last summer for two sessions due to relationship issues. Genee denied a history of hospitalizations for psychiatric concerns, and has never met with a psychiatrist. Anelia shared her neurologist began prescribing Ativan as needed around 2016 due to MS. She described it as helpful. Rosie is "unaware" of any family history of mental health related concerns. Nashea denied a trauma history, including psychological, physical  and sexual abuse, as well as neglect.    Mckaylin described her typical mood as "really happy." She noted she experiences depressed mood at times; however, believes it may be related to birth control. She noted she  discontinued taking her birth control and shared, "I feel better." She also endorsed experiencing anhedonia following her break-up in November 2019, and explained it is "getting better." Currently,  Soundra experiences trouble staying asleep as well as falling asleep and explained it is due to worry and frequent urination. She shared she averages 6 to 7 hours of sleep a night. She further endorsed experiencing the following: datigue; overeating; decreased self-esteem due to weight; worry thoughts about her future; irritability; crying spells; decreased motivation.  She denied a history of illicit and recreational substance use, and noted she consumes alcohol socially.  Brihanna denied experiencing the following: hopelessness, attention and concentration issues, memory concerns, feeling fidgety/restless, obsessions and compulsions, hallucinations and delusions, paranoia, mania, angry outbursts, social withdrawal and panic attacks. She also denied history of and current suicidal ideation, plan, and intent; history of and current homicidal ideation, plan, and intent; and history of and current engagement in self-harm.  The following strengths were reported by Joelene Millin: Helpful, caring, kind, and task oriented. The following strengths were observed by this provider: ability to express thoughts and feelings during the therapeutic session, ability to establish and benefit from a therapeutic relationship, ability to learn and practice coping skills, willingness to work toward established goal(s) with the clinic and ability to engage in reciprocal conversation.  Legal History: Seryna denied a history of legal involvement.   Structured Assessment Results: The Patient Health Questionnaire-9 (PHQ-9) is a self-report measure that assesses symptoms and severity of depression over the course of the last two weeks. Bekah obtained a score of 6 suggesting mild depression. Sharniece finds the endorsed symptoms to be somewhat difficult. Depression screen PHQ 2/9 02/17/2019  Decreased Interest 1  Down, Depressed, Hopeless 1  PHQ - 2 Score 2  Altered sleeping 1  Tired, decreased energy 1  Change in appetite 1    Feeling bad or failure about yourself  1  Trouble concentrating 0  Moving slowly or fidgety/restless 0  Suicidal thoughts 0  PHQ-9 Score 6  Difficult doing work/chores -   The Generalized Anxiety Disorder-7 (GAD-7) is a brief self-report measure that assesses symptoms of anxiety over the course of the last two weeks. Nyriah obtained a score of 5 suggesting mild anxiety. GAD 7 : Generalized Anxiety Score 02/17/2019  Nervous, Anxious, on Edge 1  Control/stop worrying 1  Worry too much - different things 1  Trouble relaxing 1  Restless 0  Easily annoyed or irritable 1  Afraid - awful might happen 0  Total GAD 7 Score 5  Anxiety Difficulty Not difficult at all   Interventions: A chart review was conducted prior to the clinical intake interview. The PHQ-9, and GAD-7 were administered and a clinical intake interview was completed. In addition, Jaeleen was asked to complete a Mood and Food questionnaire to assess various behaviors related to emotional eating. Throughout session, empathic reflections and validation was provided. Continuing treatment with this provider was discussed and a treatment goal was established. Psychoeducation regarding emotional versus physical hunger was provided. Jury was given a handout to utilize between now and the next appointment to increase awareness of hunger patterns and subsequent eating.   Provisional DSM-5 Diagnosis: 311 (F32.8) Other Specified Depressive Disorder, Emotional Eating Behaviors  Plan: Mikenna appears able and willing to participate as evidenced by collaboration on a treatment goal, engagement in reciprocal conversation, and asking questions as needed for clarification. The next appointment will be scheduled in two weeks. The following treatment goal was established: decrease emotional eating. For  the aforementioned goal, Dailey can benefit from biweekly individual therapy sessions that are brief in duration for approximately four to six  sessions. The treatment modality will be individual therapeutic services, including an eclectic therapeutic approach utilizing techniques from Cognitive Behavioral Therapy, Patient Centered Therapy, Dialectical Behavior Therapy, Acceptance and Commitment Therapy, Interpersonal Therapy, and Cognitive Restructuring. Therapeutic approach will include various interventions as appropriate, such as validation, support, mindfulness, thought defusion, reframing, psychoeducation, values assessment, and role playing. This provider will regularly review the treatment plan and medical chart to keep informed of status changes. Magalie expressed understanding and agreement with the initial treatment plan of care.

## 2019-02-17 ENCOUNTER — Other Ambulatory Visit: Payer: Self-pay

## 2019-02-17 ENCOUNTER — Ambulatory Visit (INDEPENDENT_AMBULATORY_CARE_PROVIDER_SITE_OTHER): Payer: 59 | Admitting: Psychology

## 2019-02-17 ENCOUNTER — Ambulatory Visit (INDEPENDENT_AMBULATORY_CARE_PROVIDER_SITE_OTHER): Payer: 59 | Admitting: Bariatrics

## 2019-02-17 ENCOUNTER — Encounter (INDEPENDENT_AMBULATORY_CARE_PROVIDER_SITE_OTHER): Payer: Self-pay | Admitting: Bariatrics

## 2019-02-17 VITALS — BP 108/75 | HR 86 | Temp 98.6°F | Ht 63.0 in | Wt 227.0 lb

## 2019-02-17 DIAGNOSIS — Z9189 Other specified personal risk factors, not elsewhere classified: Secondary | ICD-10-CM | POA: Diagnosis not present

## 2019-02-17 DIAGNOSIS — Z6833 Body mass index (BMI) 33.0-33.9, adult: Secondary | ICD-10-CM

## 2019-02-17 DIAGNOSIS — F3289 Other specified depressive episodes: Secondary | ICD-10-CM

## 2019-02-17 DIAGNOSIS — E8881 Metabolic syndrome: Secondary | ICD-10-CM

## 2019-02-17 DIAGNOSIS — E669 Obesity, unspecified: Secondary | ICD-10-CM

## 2019-02-17 DIAGNOSIS — E559 Vitamin D deficiency, unspecified: Secondary | ICD-10-CM | POA: Diagnosis not present

## 2019-02-17 MED ORDER — VITAMIN D (ERGOCALCIFEROL) 1.25 MG (50000 UNIT) PO CAPS: 50000.0000 [IU] | ORAL_CAPSULE | ORAL | 0 refills | Status: DC

## 2019-02-17 MED FILL — VIT D2 1.25 MG (50,000 UNIT: 1.25 MG | 28 days supply | Qty: 4 | Fill #0

## 2019-02-17 NOTE — Progress Notes (Signed)
Office: [phone]  /  Fax: [phone]   HPI:   Chief Complaint: OBESITY Tracey Lindsey is here to discuss her progress with her obesity treatment plan. She is on the Category 3 plan and is following her eating plan approximately 50 % of the time. She states she is exercising 0 minutes 0 times per week. Tracey Lindsey is doing well with the plan. She states that she is tired of "eating the same thing over and over again". Tracey Lindsey did well the first week. Her weight is 227 lb (103 kg) today and has had a weight loss of 4 pounds over a period of 2 weeks since her last visit. She has lost 4 lbs since starting treatment with [place].  Vitamin D deficiency Tracey Lindsey has a diagnosis of vitamin D deficiency. Her last vitamin D level was at 31.9 She is currently taking vit D and denies nausea, vomiting or muscle weakness.  At risk for osteopenia and osteoporosis Tracey Lindsey is at higher risk of osteopenia and osteoporosis due to vitamin D deficiency.   Insulin Resistance Tracey Lindsey has a diagnosis of insulin resistance based on her elevated fasting insulin level >5. Although Tracey Lindsey's blood glucose readings are still under good control, insulin resistance puts her at greater risk of metabolic syndrome and diabetes. Tracey Lindsey has a history of PCOS. Her last Aic was at 5.5 and last insulin level was at 14.9 She is not taking metformin currently and continues to work on diet and exercise to decrease risk of diabetes.  Depression with emotional eating behaviors Tracey Lindsey struggles with emotional eating and using food for comfort to the extent that it is negatively impacting her health. She often snacks when she is not hungry. Tracey Lindsey sometimes feels she is out of control and then feels guilty that she made poor food choices. She saw Dr. Dewaine Conger today. She has been working on behavior modification techniques to help reduce her emotional eating and has been somewhat successful. She shows no sign of suicidal or homicidal  ideations.  Depression screen [facility] 2/9 02/17/2019 02/03/2019 04/24/2018 08/23/2017 03/01/2017  Decreased Interest 1 3 0 0 0  Down, Depressed, Hopeless 1 3 0 0 0  PHQ - 2 Score 2 6 0 0 0  Altered sleeping 1 2 - - -  Tired, decreased energy 1 3 - - -  Change in appetite 1 3 - - -  Feeling bad or failure about yourself  1 1 - - -  Trouble concentrating 0 0 - - -  Moving slowly or fidgety/restless 0 - - - -  Suicidal thoughts 0 0 - - -  PHQ-9 Score 6 15 - - -  Difficult doing work/chores - Somewhat difficult - - -     ASSESSMENT AND PLAN:  Vitamin D deficiency - Plan: Vitamin D, Ergocalciferol, (DRISDOL) 1.25 MG (50000 UT) CAPS capsule  Insulin resistance  Other depression - with emotional eating  At risk for osteoporosis  Class 1 obesity with serious comorbidity and body mass index (BMI) of 33.0 to 33.9 in adult, unspecified obesity type  PLAN:  Vitamin D Deficiency Tracey Lindsey was informed that low vitamin D levels contributes to fatigue and are associated with obesity, breast, and colon cancer. She agrees to take prescription Vit D @50 ,000 IU every week #4 with no refills and will follow up for routine testing of vitamin D, at least 2-3 times per year. She was informed of the risk of over-replacement of vitamin D and agrees to not increase her dose unless she discusses this  with us first. Tracey Lindsey agrees to follow up as directed.  At risk for osteopenia and osteoporosis Tracey Lindsey was given extended  (15 minutes) osteoporosis prevention counseling today. Tracey Lindsey is at risk for osteopenia and osteoporosis due to her vitamin D deficiency. She was encouraged to take her vitamin D and follow her higher calcium diet and increase strengthening exercise to help strengthen her bones and decrease her risk of osteopenia and osteoporosis.  Insulin Resistance Tracey Lindsey will continue to work on weight loss, exercise, and decreasing simple carbohydrates in her diet to help decrease the risk of diabetes.  We dicussed insulin resistance with myself and our registered dietician today. She was informed that eating too many simple carbohydrates or too many calories at one sitting increases the likelihood of GI side effects. Tracey Lindsey agreed to follow up with us as directed to monitor her progress.  Depression with Emotional Eating Behaviors We discussed behavior modification techniques today to help Tracey Lindsey deal with her emotional eating and depression. She will continue to see Dr. Dewaine CongerBarker our bariatric psychologist.  Obesity Tracey Lindsey is currently in the action stage of change. As such, her goal is to continue with weight loss efforts She has agreed to follow the Category 3 plan Tracey Lindsey has been instructed to work up to a goal of 150 minutes of combined cardio and strengthening exercise per week for weight loss and overall health benefits. We discussed the following Behavioral Modification Strategies today: increase H2O intake, no skipping meals, keeping healthy foods in the home, increasing lean protein intake, decreasing simple carbohydrates, increasing vegetables, decrease eating out and work on meal planning and easy cooking plans  Tracey Lindsey has agreed to follow up with our clinic in 2 weeks. She was informed of the importance of frequent follow up visits to maximize her success with intensive lifestyle modifications for her multiple health conditions.  ALLERGIES: No Known Allergies  MEDICATIONS: Current Outpatient Medications on File Prior to Visit  Medication Sig Dispense Refill   cholecalciferol (VITAMIN D) 1000 units tablet Take 1,000 Units by mouth daily.     LORazepam (ATIVAN) 0.5 MG tablet Take 1 tablet (0.5 mg total) by mouth 2 (two) times daily as needed for anxiety. 15 tablet 1   natalizumab (TYSABRI) 300 MG/15ML injection Inject 15 mLs into the vein every 30 (thirty) days.      Norethindrone Acetate-Ethinyl Estradiol (LOESTRIN 1.5/30, 21,) 1.5-30 MG-MCG tablet Take 1 tablet by  mouth daily. 3 Package 2   vitamin C (ASCORBIC ACID) 500 MG tablet Take 500 mg by mouth daily.     VITAMIN D PO Take 5,000 Units by mouth daily.     Current Facility-Administered Medications on File Prior to Visit  Medication Dose Route Frequency Provider Last Rate Last Dose   gadopentetate dimeglumine (MAGNEVIST) injection 18 mL  18 mL Intravenous Once PRN Sater, Pearletha Furlichard A, MD        PAST MEDICAL HISTORY: Past Medical History:  Diagnosis Date   Anxiety    Chlamydia    Depression    History of abnormal cervical Papanicolaou smear    MS (multiple sclerosis) ([facility]) April 2016   PCOS (polycystic ovarian syndrome)    Vitamin D deficiency     PAST SURGICAL HISTORY: History reviewed. No pertinent surgical history.  SOCIAL HISTORY: Social History   Tobacco Use   Smoking status: Never Smoker   Smokeless tobacco: Never Used  Substance Use Topics   Alcohol use: Yes    Comment: occasionally   Drug use: Never  FAMILY HISTORY: Family History  Problem Relation Age of Onset   Diabetes Father    Hypertension Father    Stroke Father    Cancer Mother        lung   Hyperlipidemia Mother    Lung disease Mother    Eczema Brother     ROS: Review of Systems  Constitutional: Positive for weight loss.  Gastrointestinal: Negative for nausea and vomiting.  Musculoskeletal:       Negative for muscle weakness  Psychiatric/Behavioral: Positive for depression. Negative for suicidal ideas.    PHYSICAL EXAM: Blood pressure 108/75, pulse 86, temperature 98.6 F (37 C), temperature source Oral, height 5\' 3"  (1.6 m), weight 227 lb (103 kg), last menstrual period 02/07/2019, SpO2 100 %. Body mass index is 40.21 kg/m. Physical Exam Vitals signs reviewed.  Constitutional:      Appearance: Normal appearance. She is well-developed. She is obese.  Cardiovascular:     Rate and Rhythm: Normal rate.  Pulmonary:     Effort: Pulmonary effort is normal.  Musculoskeletal:  Normal range of motion.  Skin:    General: Skin is warm and dry.  Neurological:     Mental Status: She is alert and oriented to person, place, and time.  Psychiatric:        Mood and Affect: Mood normal.        Behavior: Behavior normal.        Thought Content: Thought content does not include homicidal or suicidal ideation.     RECENT LABS AND TESTS: BMET    Component Value Date/Time   NA 138 02/03/2019 0933   K 4.3 02/03/2019 0933   CL 103 02/03/2019 0933   CO2 22 02/03/2019 0933   GLUCOSE 86 02/03/2019 0933   GLUCOSE 87 04/24/2018 1138   BUN 8 02/03/2019 0933   CREATININE 0.79 02/03/2019 0933   CREATININE 0.75 04/24/2018 1138   CALCIUM 9.3 02/03/2019 0933   GFRNONAA 103 02/03/2019 0933   GFRNONAA 110 04/24/2018 1138   GFRAA 119 02/03/2019 0933   GFRAA 127 04/24/2018 1138   Lab Results  Component Value Date   HGBA1C 5.5 02/03/2019   Lab Results  Component Value Date   INSULIN 14.9 02/03/2019   CBC    Component Value Date/Time   WBC 9.7 02/02/2019 1201   WBC 8.4 04/24/2018 1138   RBC 4.37 02/02/2019 1201   RBC 4.60 04/24/2018 1138   HGB 13.5 02/02/2019 1201   HCT 39.6 02/02/2019 1201   PLT 285 02/02/2019 1201   MCV 91 02/02/2019 1201   MCH 30.9 02/02/2019 1201   MCH 30.4 04/24/2018 1138   MCHC 34.1 02/02/2019 1201   MCHC 34.2 04/24/2018 1138   RDW 12.9 02/02/2019 1201   LYMPHSABS 4.0 (H) 02/02/2019 1201   MONOABS 0.3 03/31/2015 1015   EOSABS 0.3 02/02/2019 1201   BASOSABS 0.1 02/02/2019 1201   Iron/TIBC/Ferritin/ %Sat No results found for: IRON, TIBC, FERRITIN, IRONPCTSAT Lipid Panel     Component Value Date/Time   CHOL 205 (H) 02/03/2019 0933   TRIG 125 02/03/2019 0933   HDL 44 02/03/2019 0933   CHOLHDL 4.1 04/24/2018 1138   LDLCALC 136 (H) 02/03/2019 0933   LDLCALC 131 (H) 04/24/2018 1138   Hepatic Function Panel     Component Value Date/Time   PROT 6.7 02/03/2019 0933   ALBUMIN 4.4 02/03/2019 0933   AST 18 02/03/2019 0933   ALT 11  02/03/2019 0933   ALKPHOS 45 02/03/2019 0933   BILITOT 0.3  02/03/2019 0933      Component Value Date/Time   TSH 2.440 02/03/2019 0933   TSH 1.780 07/24/2018 1441   TSH 1.29 04/24/2018 1138     Ref. Range 02/02/2019 12:01  Vitamin D, 25-Hydroxy Latest Ref Range: 30.0 - 100.0 ng/mL 31.9     OBESITY BEHAVIORAL INTERVENTION VISIT  Today's visit was # 2   Starting weight: 231 lbs Starting date: 02/03/2019 Today's weight : 227 lbs Today's date: 02/17/2019 Total lbs lost to date: 4    02/17/2019  Height 5\' 3"  (1.6 m)  Weight 227 lb (103 kg)  BMI (Calculated) 40.22  BLOOD PRESSURE - SYSTOLIC 108  BLOOD PRESSURE - DIASTOLIC 75   Body Fat % 38.8 %  Total Body Water (lbs) 95.6 lbs    ASK: We discussed the diagnosis of obesity with Antionette Poles today and Mikailah agreed to give [place] permission to discuss obesity behavioral modification therapy today.  ASSESS: Sanylah has the diagnosis of obesity and her BMI today is 40.22 Jonea is in the action stage of change   ADVISE: Cena was educated on the multiple health risks of obesity as well as the benefit of weight loss to improve her health. She was advised of the need for [place] term treatment and the importance of lifestyle modifications to improve her current health and to decrease her risk of future health problems.  AGREE: Multiple dietary modification options and treatment options were discussed and  Victoriah agreed to follow the recommendations documented in the above note.  ARRANGE: Jennefer was educated on the importance of frequent visits to treat obesity as outlined per CMS and USPSTF guidelines and agreed to schedule her next follow up appointment today.  Cristi Loron, am acting as Energy manager for [employer]. Manson Passey, DO  I have reviewed the above documentation for accuracy and completeness, and I agree with the above. -Corinna Capra, DO

## 2019-02-18 ENCOUNTER — Ambulatory Visit (INDEPENDENT_AMBULATORY_CARE_PROVIDER_SITE_OTHER): Payer: 59 | Admitting: Bariatrics

## 2019-02-18 DIAGNOSIS — E8881 Metabolic syndrome: Secondary | ICD-10-CM | POA: Insufficient documentation

## 2019-02-18 NOTE — Progress Notes (Signed)
Hi Dr. Sherie Don,   Thank you so much for your kind words. Just let me know if you ever want to come by and I will arrange it.   Dr. Manson Passey

## 2019-02-19 ENCOUNTER — Encounter (INDEPENDENT_AMBULATORY_CARE_PROVIDER_SITE_OTHER): Payer: Self-pay

## 2019-02-24 ENCOUNTER — Encounter (INDEPENDENT_AMBULATORY_CARE_PROVIDER_SITE_OTHER): Payer: Self-pay

## 2019-03-05 ENCOUNTER — Encounter (INDEPENDENT_AMBULATORY_CARE_PROVIDER_SITE_OTHER): Payer: Self-pay | Admitting: Family Medicine

## 2019-03-05 ENCOUNTER — Other Ambulatory Visit: Payer: Self-pay

## 2019-03-05 ENCOUNTER — Ambulatory Visit (INDEPENDENT_AMBULATORY_CARE_PROVIDER_SITE_OTHER): Payer: 59 | Admitting: Family Medicine

## 2019-03-05 ENCOUNTER — Ambulatory Visit (INDEPENDENT_AMBULATORY_CARE_PROVIDER_SITE_OTHER): Payer: Self-pay | Admitting: Psychology

## 2019-03-05 ENCOUNTER — Encounter (INDEPENDENT_AMBULATORY_CARE_PROVIDER_SITE_OTHER): Payer: Self-pay

## 2019-03-05 DIAGNOSIS — Z6841 Body Mass Index (BMI) 40.0 and over, adult: Secondary | ICD-10-CM | POA: Diagnosis not present

## 2019-03-05 DIAGNOSIS — E559 Vitamin D deficiency, unspecified: Secondary | ICD-10-CM | POA: Diagnosis not present

## 2019-03-05 DIAGNOSIS — G35 Multiple sclerosis: Secondary | ICD-10-CM | POA: Diagnosis not present

## 2019-03-05 DIAGNOSIS — E8881 Metabolic syndrome: Secondary | ICD-10-CM | POA: Diagnosis not present

## 2019-03-05 MED ORDER — METFORMIN HCL 500 MG PO TABS: 500.0000 mg | ORAL_TABLET | Freq: Every day | ORAL | 0 refills | Status: DC

## 2019-03-05 MED FILL — metFORMIN HCL 500 MG TABS: 500 | 30 days supply | Qty: 30 | Fill #0

## 2019-03-08 NOTE — Progress Notes (Signed)
Office: [phone]  /  Fax: [phone] TeleHealth Visit:  Tracey Lindsey has verbally consented to this TeleHealth visit today. The patient is located at home, the provider is located at the [facility] office. The participants in this visit include the listed provider and patient and provider's assistant. The visit was conducted today via face time.  HPI:   Chief Complaint: OBESITY Tracey Lindsey is here to discuss her progress with her obesity treatment plan. She is on the Category 3 plan and is following her eating plan approximately 25 % of the time. She states she is exercising 0 minutes 0 times per week. Masiya is still going to work (works at [employer] and [employer]) at [employer]. She is having a hard time finding food she needs at the grocery store for mostly lunch meal. She is doing a omelet with veggies for breakfast and dinner she is doing meat with veggies (occasionally with potatoes).  We were unable to weigh the patient today for this TeleHealth visit. She feels as if she has maintained her weight since her last visit. She has lost 4 lbs since starting treatment with [place].  Insulin Resistance Tracey Lindsey has a diagnosis of insulin resistance based on her elevated fasting insulin level >5. Although Krishana's blood glucose readings are still under good control, insulin resistance puts her at greater risk of metabolic syndrome and diabetes. She is not taking metformin currently and voices increase in hunger and carbohydrates cravings. She continues to work on diet and exercise to decrease risk of diabetes.  Vitamin D Deficiency Tracey Lindsey has a diagnosis of vitamin D deficiency. She is currently taking prescription Vit D. She notes fatigue and denies nausea, vomiting or muscle weakness.  ASSESSMENT AND PLAN:  Insulin resistance - Plan: metFORMIN (GLUCOPHAGE) 500 MG tablet  Vitamin D deficiency  Class 3 severe obesity with serious comorbidity and body mass index (BMI) of  40.0 to 44.9 in adult, unspecified obesity type (HCC)  PLAN:  Insulin Resistance Tracey Lindsey will continue to work on weight loss, exercise, and decreasing simple carbohydrates in her diet to help decrease the risk of diabetes. We dicussed metformin including benefits and risks. She was informed that eating too many simple carbohydrates or too many calories at one sitting increases the likelihood of GI side effects. Fraidy agrees to start metformin 500 mg PO daily #30 with no refills. Syriah agrees to follow up with our clinic in 2 weeks as directed to monitor her progress.  Vitamin D Deficiency Xinyu was informed that low vitamin D levels contributes to fatigue and are associated with obesity, breast, and colon cancer. Ahsha agrees to continue taking prescription Vit D @50 ,000 IU every week, no refill needed. She will follow up for routine testing of vitamin D, at least 2-3 times per year. She was informed of the risk of over-replacement of vitamin D and agrees to not increase her dose unless she discusses this with [place] first. Taneka agrees to follow up with our clinic in 2 weeks.  Obesity Tracey Lindsey is currently in the action stage of change. As such, her goal is to continue with weight loss efforts She has agreed to keep a food journal with 350-500 calories and 30+ grams of protein at lunch daily and follow the Category 3 plan Tracey Lindsey has been instructed to work up to a goal of 150 minutes of combined cardio and strengthening exercise per week for weight loss and overall health benefits. We discussed the following Behavioral Modification Strategies today: increasing lean  protein intake, increasing vegetables and work on meal planning and easy cooking plans, better snacking choices, and planning for success   Tracey Lindsey has agreed to follow up with our clinic in 2 weeks. She was informed of the importance of frequent follow up visits to maximize her success with intensive lifestyle  modifications for her multiple health conditions.  ALLERGIES: No Known Allergies  MEDICATIONS: Current Outpatient Medications on File Prior to Visit  Medication Sig Dispense Refill  . cholecalciferol (VITAMIN D) 1000 units tablet Take 1,000 Units by mouth daily.    Marland Kitchen LORazepam (ATIVAN) 0.5 MG tablet Take 1 tablet (0.5 mg total) by mouth 2 (two) times daily as needed for anxiety. 15 tablet 1  . natalizumab (TYSABRI) 300 MG/15ML injection Inject 15 mLs into the vein every 30 (thirty) days.     . Norethindrone Acetate-Ethinyl Estradiol (LOESTRIN 1.5/30, 21,) 1.5-30 MG-MCG tablet Take 1 tablet by mouth daily. 3 Package 2  . vitamin C (ASCORBIC ACID) 500 MG tablet Take 500 mg by mouth daily.    Marland Kitchen VITAMIN D PO Take 5,000 Units by mouth daily.    . Vitamin D, Ergocalciferol, (DRISDOL) 1.25 MG (50000 UT) CAPS capsule Take 1 capsule (50,000 Units total) by mouth every 7 (seven) days. 4 capsule 0   Current Facility-Administered Medications on File Prior to Visit  Medication Dose Route Frequency Provider Last Rate Last Dose  . gadopentetate dimeglumine (MAGNEVIST) injection 18 mL  18 mL Intravenous Once PRN Sater, Pearletha Furl, MD        PAST MEDICAL HISTORY: Past Medical History:  Diagnosis Date  . Anxiety   . Chlamydia   . Depression   . History of abnormal cervical Papanicolaou smear   . MS (multiple sclerosis) Tracey Lindsey) April 2016  . PCOS (polycystic ovarian syndrome)   . Vitamin D deficiency     PAST SURGICAL HISTORY: History reviewed. No pertinent surgical history.  SOCIAL HISTORY: Social History   Tobacco Use  . Smoking status: Never Smoker  . Smokeless tobacco: Never Used  Substance Use Topics  . Alcohol use: Yes    Comment: occasionally  . Drug use: Never    FAMILY HISTORY: Family History  Problem Relation Age of Onset  . Diabetes Father   . Hypertension Father   . Stroke Father   . Cancer Mother        lung  . Hyperlipidemia Mother   . Lung disease Mother   . Eczema  Brother     ROS: Review of Systems  Constitutional: Positive for malaise/fatigue. Negative for weight loss.  Gastrointestinal: Negative for nausea and vomiting.  Musculoskeletal:       Negative muscle weakness    PHYSICAL EXAM: Pt in no acute distress  RECENT LABS AND TESTS: BMET    Component Value Date/Time   NA 138 02/03/2019 0933   K 4.3 02/03/2019 0933   CL 103 02/03/2019 0933   CO2 22 02/03/2019 0933   GLUCOSE 86 02/03/2019 0933   GLUCOSE 87 04/24/2018 1138   BUN 8 02/03/2019 0933   CREATININE 0.79 02/03/2019 0933   CREATININE 0.75 04/24/2018 1138   CALCIUM 9.3 02/03/2019 0933   GFRNONAA 103 02/03/2019 0933   GFRNONAA 110 04/24/2018 1138   GFRAA 119 02/03/2019 0933   GFRAA 127 04/24/2018 1138   Lab Results  Component Value Date   HGBA1C 5.5 02/03/2019   Lab Results  Component Value Date   INSULIN 14.9 02/03/2019   CBC    Component Value Date/Time  WBC 9.7 02/02/2019 1201   WBC 8.4 04/24/2018 1138   RBC 4.37 02/02/2019 1201   RBC 4.60 04/24/2018 1138   HGB 13.5 02/02/2019 1201   HCT 39.6 02/02/2019 1201   PLT 285 02/02/2019 1201   MCV 91 02/02/2019 1201   MCH 30.9 02/02/2019 1201   MCH 30.4 04/24/2018 1138   MCHC 34.1 02/02/2019 1201   MCHC 34.2 04/24/2018 1138   RDW 12.9 02/02/2019 1201   LYMPHSABS 4.0 (H) 02/02/2019 1201   MONOABS 0.3 03/31/2015 1015   EOSABS 0.3 02/02/2019 1201   BASOSABS 0.1 02/02/2019 1201   Iron/TIBC/Ferritin/ %Sat No results found for: IRON, TIBC, FERRITIN, IRONPCTSAT Lipid Panel     Component Value Date/Time   CHOL 205 (H) 02/03/2019 0933   TRIG 125 02/03/2019 0933   HDL 44 02/03/2019 0933   CHOLHDL 4.1 04/24/2018 1138   LDLCALC 136 (H) 02/03/2019 0933   LDLCALC 131 (H) 04/24/2018 1138   Hepatic Function Panel     Component Value Date/Time   PROT 6.7 02/03/2019 0933   ALBUMIN 4.4 02/03/2019 0933   AST 18 02/03/2019 0933   ALT 11 02/03/2019 0933   ALKPHOS 45 02/03/2019 0933   BILITOT 0.3 02/03/2019 0933       Component Value Date/Time   TSH 2.440 02/03/2019 0933   TSH 1.780 07/24/2018 1441   TSH 1.29 04/24/2018 1138      I, Burt Knack, am acting as transcriptionist for Debbra Riding, MD I have reviewed the above documentation for accuracy and completeness, and I agree with the above. - Debbra Riding, MD

## 2019-03-12 ENCOUNTER — Telehealth (INDEPENDENT_AMBULATORY_CARE_PROVIDER_SITE_OTHER): Payer: Self-pay | Admitting: Psychology

## 2019-03-12 NOTE — Telephone Encounter (Signed)
  Office: [phone]  /  Fax: [phone]  Date of Call: March 12, 2019 Time of Call: 11:17am Duration of Call: 5 minutes Provider: Lawerance Cruel, PsyD  CONTENT: This provider called Cala Bradford to check-in and schedule an appointment, as the last appointment was canceled by [facility]. Neri shared things are "good;" however, she continues to have to go into work in-person. She acknowledged she has also engaged in "stress eating" since the last appointment with this provider. Adair was receptive to scheduling an appointment; therefore, this provider confirmed Diamante's e-mail address and provided instructions for WebEx appointments. This provider explained an e-mail will be sent with a secure link for the upcoming appointment, and the e-mail will also have an informed consent document for telepsychological services. This provider requested it be completed and returned. Damoni verbally acknowledged understanding that sending the consent document via a MyChart message will make it part of her medical record; therefore, visible to all providers. A brief risk assessment was completed. She denied experiencing suicidal and homicidal ideation, plan, and intent since the last appointment with this provider.   PLAN: Daisja is scheduled for a follow-up appointment via WebEx on March 25, 2019 at 4:00pm.

## 2019-03-19 ENCOUNTER — Encounter (INDEPENDENT_AMBULATORY_CARE_PROVIDER_SITE_OTHER): Payer: Self-pay | Admitting: Family Medicine

## 2019-03-19 ENCOUNTER — Other Ambulatory Visit: Payer: Self-pay

## 2019-03-19 ENCOUNTER — Ambulatory Visit (INDEPENDENT_AMBULATORY_CARE_PROVIDER_SITE_OTHER): Payer: 59 | Admitting: Family Medicine

## 2019-03-19 DIAGNOSIS — E7849 Other hyperlipidemia: Secondary | ICD-10-CM

## 2019-03-19 DIAGNOSIS — Z6841 Body Mass Index (BMI) 40.0 and over, adult: Secondary | ICD-10-CM

## 2019-03-19 DIAGNOSIS — E8881 Metabolic syndrome: Secondary | ICD-10-CM | POA: Diagnosis not present

## 2019-03-23 ENCOUNTER — Encounter (INDEPENDENT_AMBULATORY_CARE_PROVIDER_SITE_OTHER): Payer: Self-pay

## 2019-03-25 ENCOUNTER — Encounter (INDEPENDENT_AMBULATORY_CARE_PROVIDER_SITE_OTHER): Payer: Self-pay

## 2019-03-25 ENCOUNTER — Other Ambulatory Visit: Payer: Self-pay

## 2019-03-25 ENCOUNTER — Ambulatory Visit (INDEPENDENT_AMBULATORY_CARE_PROVIDER_SITE_OTHER): Payer: 59 | Admitting: Psychology

## 2019-03-25 DIAGNOSIS — F3289 Other specified depressive episodes: Secondary | ICD-10-CM | POA: Diagnosis not present

## 2019-03-25 NOTE — Progress Notes (Signed)
Office: [phone]  /  Fax: [phone] TeleHealth Visit:  Tracey Lindsey has verbally consented to this TeleHealth visit today. The patient is located at home, the provider is located at the [facility] office. The participants in this visit include the listed provider and patient. The visit was conducted today via face time.  HPI:   Chief Complaint: OBESITY Tracey Lindsey is here to discuss her progress with her obesity treatment plan. She is on the keep a food journal with 350-500 calories and 30+ grams of protein at lunch daily and follow the Category 3 plan and is following her eating plan approximately 25 % of the time. She states she is exercising 0 minutes 0 times per week. Tracey Lindsey is still working full time at the [employer] the first after her last appointment. She was on track with following the plan, but this week she has been eating more out of convenience because lunch is being catered. She plans to start meal prepping.  We were unable to weigh the patient today for this TeleHealth visit. She feels as if she has maintained her weight since her last visit. She has lost 4 lbs since starting treatment with [place].  Hyperlipidemia Tracey Lindsey has hyperlipidemia and has been trying to improve her cholesterol levels with intensive lifestyle modification including a low saturated fat diet, exercise and weight loss. Last LDL was of 136 and HDL of 44. She denies any chest pain, claudication or myalgias.  Insulin Resistance Tracey Lindsey has a diagnosis of insulin resistance based on her elevated fasting insulin level >5. Last insulin level of 14.9 and Hgb A1c of 5.5. Although Tracey Lindsey's blood glucose readings are still under good control, insulin resistance puts her at greater risk of metabolic syndrome and diabetes. She stopped taking metformin secondary to GI issues. She continues to work on diet and exercise to decrease risk of diabetes.  ASSESSMENT AND PLAN:  Other  hyperlipidemia  Insulin resistance  Class 3 severe obesity with serious comorbidity and body mass index (BMI) of 40.0 to 44.9 in adult, unspecified obesity type (HCC)  PLAN:  Hyperlipidemia Tracey Lindsey was informed of the American Heart Association Guidelines emphasizing intensive lifestyle modifications as the first line treatment for hyperlipidemia. We discussed many lifestyle modifications today in depth, and Tracey Lindsey will continue to work on decreasing saturated fats such as fatty red meat, butter and many fried foods. She will also increase vegetables and lean protein in her diet and continue to work on exercise and weight loss efforts. We will repeat FLP in mid June. March agrees to follow up with our clinic in 2 weeks.  Insulin Resistance Tracey Lindsey will continue to work on weight loss, exercise, and decreasing simple carbohydrates in her diet to help decrease the risk of diabetes. We dicussed metformin including benefits and risks. She was informed that eating too many simple carbohydrates or too many calories at one sitting increases the likelihood of GI side effects. Tracey Lindsey declined metformin for now and prescription was not written today. She is to retry metformin when she is following the plan. Tracey Lindsey agrees to follow up with our clinic in 2 weeks as directed to monitor her progress.  Obesity Tracey Lindsey is currently in the action stage of change. As such, her goal is to continue with weight loss efforts She has agreed to follow the Category 3 plan Tracey Lindsey has been instructed to work up to a goal of 150 minutes of combined cardio and strengthening exercise per week for weight loss  and overall health benefits. We discussed the following Behavioral Modification Strategies today: increasing lean protein intake, increasing vegetables, work on meal planning and easy cooking plans, emotional eating strategies, ways to avoid boredom eating, better snacking choices, and planning for success  Tracey Lindsey is to work on self-care.  Tracey Lindsey has agreed to follow up with our clinic in 2 weeks. She was informed of the importance of frequent follow up visits to maximize her success with intensive lifestyle modifications for her multiple health conditions.  ALLERGIES: No Known Allergies  MEDICATIONS: Current Outpatient Medications on File Prior to Visit  Medication Sig Dispense Refill  . cholecalciferol (VITAMIN D) 1000 units tablet Take 1,000 Units by mouth daily.    Marland Kitchen LORazepam (ATIVAN) 0.5 MG tablet Take 1 tablet (0.5 mg total) by mouth 2 (two) times daily as needed for anxiety. 15 tablet 1  . metFORMIN (GLUCOPHAGE) 500 MG tablet Take 1 tablet (500 mg total) by mouth daily with breakfast. 30 tablet 0  . natalizumab (TYSABRI) 300 MG/15ML injection Inject 15 mLs into the vein every 30 (thirty) days.     . Norethindrone Acetate-Ethinyl Estradiol (LOESTRIN 1.5/30, 21,) 1.5-30 MG-MCG tablet Take 1 tablet by mouth daily. 3 Package 2  . vitamin C (ASCORBIC ACID) 500 MG tablet Take 500 mg by mouth daily.    Marland Kitchen VITAMIN D PO Take 5,000 Units by mouth daily.    . Vitamin D, Ergocalciferol, (DRISDOL) 1.25 MG (50000 UT) CAPS capsule Take 1 capsule (50,000 Units total) by mouth every 7 (seven) days. 4 capsule 0   Current Facility-Administered Medications on File Prior to Visit  Medication Dose Route Frequency Provider Last Rate Last Dose  . gadopentetate dimeglumine (MAGNEVIST) injection 18 mL  18 mL Intravenous Once PRN Sater, Pearletha Furl, MD        PAST MEDICAL HISTORY: Past Medical History:  Diagnosis Date  . Anxiety   . Chlamydia   . Depression   . History of abnormal cervical Papanicolaou smear   . MS (multiple sclerosis) [facility]) April 2016  . PCOS (polycystic ovarian syndrome)   . Vitamin D deficiency     PAST SURGICAL HISTORY: History reviewed. No pertinent surgical history.  SOCIAL HISTORY: Social History   Tobacco Use  . Smoking status: Never Smoker  . Smokeless tobacco: Never  Used  Substance Use Topics  . Alcohol use: Yes    Comment: occasionally  . Drug use: Never    FAMILY HISTORY: Family History  Problem Relation Age of Onset  . Diabetes Father   . Hypertension Father   . Stroke Father   . Cancer Mother        lung  . Hyperlipidemia Mother   . Lung disease Mother   . Eczema Brother     ROS: Review of Systems  Constitutional: Negative for weight loss.  Cardiovascular: Negative for chest pain and claudication.  Musculoskeletal: Negative for myalgias.    PHYSICAL EXAM: Pt in no acute distress  RECENT LABS AND TESTS: BMET    Component Value Date/Time   NA 138 02/03/2019 0933   K 4.3 02/03/2019 0933   CL 103 02/03/2019 0933   CO2 22 02/03/2019 0933   GLUCOSE 86 02/03/2019 0933   GLUCOSE 87 04/24/2018 1138   BUN 8 02/03/2019 0933   CREATININE 0.79 02/03/2019 0933   CREATININE 0.75 04/24/2018 1138   CALCIUM 9.3 02/03/2019 0933   GFRNONAA 103 02/03/2019 0933   GFRNONAA 110 04/24/2018 1138   GFRAA 119 02/03/2019 0933   GFRAA 127  04/24/2018 1138   Lab Results  Component Value Date   HGBA1C 5.5 02/03/2019   Lab Results  Component Value Date   INSULIN 14.9 02/03/2019   CBC    Component Value Date/Time   WBC 9.7 02/02/2019 1201   WBC 8.4 04/24/2018 1138   RBC 4.37 02/02/2019 1201   RBC 4.60 04/24/2018 1138   HGB 13.5 02/02/2019 1201   HCT 39.6 02/02/2019 1201   PLT 285 02/02/2019 1201   MCV 91 02/02/2019 1201   MCH 30.9 02/02/2019 1201   MCH 30.4 04/24/2018 1138   MCHC 34.1 02/02/2019 1201   MCHC 34.2 04/24/2018 1138   RDW 12.9 02/02/2019 1201   LYMPHSABS 4.0 (H) 02/02/2019 1201   MONOABS 0.3 03/31/2015 1015   EOSABS 0.3 02/02/2019 1201   BASOSABS 0.1 02/02/2019 1201   Iron/TIBC/Ferritin/ %Sat No results found for: IRON, TIBC, FERRITIN, IRONPCTSAT Lipid Panel     Component Value Date/Time   CHOL 205 (H) 02/03/2019 0933   TRIG 125 02/03/2019 0933   HDL 44 02/03/2019 0933   CHOLHDL 4.1 04/24/2018 1138   LDLCALC  136 (H) 02/03/2019 0933   LDLCALC 131 (H) 04/24/2018 1138   Hepatic Function Panel     Component Value Date/Time   PROT 6.7 02/03/2019 0933   ALBUMIN 4.4 02/03/2019 0933   AST 18 02/03/2019 0933   ALT 11 02/03/2019 0933   ALKPHOS 45 02/03/2019 0933   BILITOT 0.3 02/03/2019 0933      Component Value Date/Time   TSH 2.440 02/03/2019 0933   TSH 1.780 07/24/2018 1441   TSH 1.29 04/24/2018 1138      I, Burt KnackSharon Martin, am acting as transcriptionist for Debbra RidingAlexandria Kadolph, MD  I have reviewed the above documentation for accuracy and completeness, and I agree with the above. - Debbra RidingAlexandria Kadolph, MD

## 2019-03-25 NOTE — Progress Notes (Signed)
Office: [phone]  /  Fax: [phone]    Date: March 25, 2019   Appointment Start Time: 2:42pm Duration: 26 minutes Provider: Lawerance Cruel, Psy.D. Type of Session: Individual Therapy  Location of Patient: Work-office Location of Provider: Home Type of Contact: Telepsychological Visit via [employer] Web[employer]   Session Content: Prior to initiating telepsychological services, Kaislynn was provided with an informed consent document via e-mail, which included the development of a safety plan (I.e., an emergency contact and emergency resources) in the event of an emergency/crisis. Manning returned the completed consent form prior to today's appointment via fax as this provider's clinic is closed. This provider verbally reviewed the consent form during today's appointment prior to proceeding with the appointment. Neila verbally acknowledged understanding that she is ultimately responsible for understanding her insurance benefits as it relates to reimbursement of telepsychological services. This provider also reviewed confidentiality, as it relates to telepsychological services, as well as the rationale for telepsychological services. More specifically, this provider's clinic is closed for in-person visits due to COVID-19. Therapeutic services will resume to in-person appointments once the clinic re-opens. Janesse expressed understanding regarding the rationale for telepsychological services. In addition, this provider explained the telepsychological services informed consent document would be considered an addendum to the initial consent document. Lenna verbally consented to proceed.    Jama is a 28 y.o. female presenting via [employer] WebEx for a follow-up appointment to address the previously established treatment goal of decreasing emotional eating. Prior to proceeding with today's appointment, Geralda's physical location at the time of this appointment was obtained. Gianelle reported she was at  work in her office, and provided the address. In the event of technical difficulties, Jaleese shared a phone number she could be reached at. Niria and this provider participated in today's telepsychological service. Also, Hela denied anyone else being present in the room or on the WebEx appointment. Of note, Lithzy initially called in to today's WebEx appointment, and explained it was because she did not have "storage" on her phone to download the app. Thus, this provider discussed alternatives so that today's appointment could have both audio and video capabilities. As such, today's appointment was initiated 12 minutes late.   After discussing the consent document for telepsychological services, this provider verbally administered the PHQ-9 and GAD-7, and conducted a brief check-in. Anjanetta shared, "This week has been a lot of better." She discussed adjusting to the new changes due to COVID- 19, and she continues to go into work. Mehwish further shared things have "been a little boring" from a social perspective. From an eating perspective, Ikeisha shared, "At first, it was really hard, but this week has been a lot of better." She added, "I just feel a lot better overall." Regarding emotional eating, Hardeep noted engaging in emotional eating since the last appointment with this provider, but denied episodes this week. Moreover, psychoeducation regarding triggers for emotional eating was provided. Nickayla was provided a handout via a MyChart message, and encouraged to utilize the handout between now and the next appointment to increase awareness of triggers and frequency. Cala Bradford agreed. This provider also discussed behavioral strategies for specific triggers, such as placing the utensil down when conversing to avoid mindless eating. Prior to sending the MyChartmessage, this provider explained the message would be visible to all providers, as it would be part of the electronic medical record.  Tyreonna verbally acknowledged understanding, and verbally consented to this provider sending the MyChart message. Chantell was receptive to today's session as evidenced by openness  to sharing, responsiveness to feedback, and willingness to identify triggers for emotional eating.  Mental Status Examination:  Appearance: neat Behavior: cooperative Mood: euthymic Affect: mood congruent Speech: normal in rate, volume, and tone Eye Contact: appropriate Psychomotor Activity: appropriate Thought Process: linear, logical, and goal directed  Content/Perceptual Disturbances: denies suicidal and homicidal ideation, plan, and intent and no hallucinations, delusions, bizarre thinking or behavior reported or observed Orientation: time, person, place and purpose of appointment Cognition/Sensorium: memory, attention, language, and fund of knowledge intact  Insight: good Judgment: good  Structured Assessment Results: The Patient Health Questionnaire-9 (PHQ-9) is a self-report measure that assesses symptoms and severity of depression over the course of the last two weeks. Norris obtained a score of 0. Depression screen [facility] 2/9 03/25/2019  Decreased Interest 0  Down, Depressed, Hopeless 0  PHQ - 2 Score 0  Altered sleeping 0  Tired, decreased energy 0  Change in appetite 0  Feeling bad or failure about yourself  0  Trouble concentrating 0  Moving slowly or fidgety/restless 0  Suicidal thoughts 0  PHQ-9 Score 0  Difficult doing work/chores -   The Generalized Anxiety Disorder-7 (GAD-7) is a brief self-report measure that assesses symptoms of anxiety over the course of the last two weeks. Margrethe obtained a score of 0. GAD 7 : Generalized Anxiety Score 03/25/2019  Nervous, Anxious, on Edge 0  Control/stop worrying 0  Worry too much - different things 0  Trouble relaxing 0  Restless 0  Easily annoyed or irritable 0  Afraid - awful might happen 0  Total GAD 7 Score 0  Anxiety Difficulty -    Interventions:  Administration of PHQ-9 and GAD-7 for symptom monitoring Empathic reflections and validation Psychoeducation regarding triggers for emotional eating Positive reinforcement Rapport building Brief chart review  DSM-5 Diagnosis: 311 (F32.8) Other Specified Depressive Disorder, Emotional Eating Behaviors  Treatment Goal & Progress: During the initial appointment with this provider, the following treatment goal was established: decrease emotional eating. Progress is limited, as Dyonna has just begun treatment with this provider; however, she is receptive to the interaction and interventions and rapport is being established.  Plan: Gayna continues to appear able and willing to participate as evidenced by engagement in reciprocal conversation, and asking questions for clarification as appropriate. The next appointment will be scheduled in two weeks, which will be via [employer]. Once this provider's office resumes in-person appointments, Yuri will be notified. The next session will focus on reviewing triggers for emotional eating and the introduction of mindfulness.

## 2019-04-02 ENCOUNTER — Encounter (INDEPENDENT_AMBULATORY_CARE_PROVIDER_SITE_OTHER): Payer: Self-pay | Admitting: Family Medicine

## 2019-04-02 ENCOUNTER — Other Ambulatory Visit: Payer: Self-pay

## 2019-04-02 ENCOUNTER — Ambulatory Visit (INDEPENDENT_AMBULATORY_CARE_PROVIDER_SITE_OTHER): Payer: 59 | Admitting: Family Medicine

## 2019-04-02 DIAGNOSIS — Z6841 Body Mass Index (BMI) 40.0 and over, adult: Secondary | ICD-10-CM

## 2019-04-02 DIAGNOSIS — G35 Multiple sclerosis: Secondary | ICD-10-CM | POA: Diagnosis not present

## 2019-04-02 DIAGNOSIS — E8881 Metabolic syndrome: Secondary | ICD-10-CM

## 2019-04-02 DIAGNOSIS — E7849 Other hyperlipidemia: Secondary | ICD-10-CM | POA: Diagnosis not present

## 2019-04-02 NOTE — Progress Notes (Signed)
Office: [phone]  /  Fax: [phone] TeleHealth Visit:  Tracey Lindsey has verbally consented to this TeleHealth visit today. The patient is located at home, the provider is located at the [facility] office. The participants in this visit include the listed provider and patient. The visit was conducted today via face time.  HPI:   Chief Complaint: OBESITY Tracey Lindsey is here to discuss her progress with her obesity treatment plan. She is on the Category 3 plan and is following her eating plan approximately 50 % of the time. She states she is exercising 0 minutes 0 times per week. 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 The first week she did really good on the plan, but the second week was difficult secondary to lack of meal planning. She doesn't have meat at home. She is doing a mix of takeout and eating what's available at home.  We were unable to weigh the patient today for this TeleHealth visit. She feels as if she has lost 2 lbs since her last visit. She has lost 4-6 lbs since starting treatment with us.  Insulin Resistance Tracey Lindsey has a diagnosis of insulin resistance based on her elevated fasting insulin level >5. Last insulin level was 14.9 and Hgb A1c was 5.5. Although Tracey Lindsey's blood glucose readings are still under good control, insulin resistance puts her at greater risk of metabolic syndrome and diabetes. She is taking metformin currently and notes carbohydrate cravings. She continues to work on diet and exercise to decrease risk of diabetes.  Hyperlipidemia Tracey Lindsey has hyperlipidemia and has been trying to improve her cholesterol levels with intensive lifestyle modification including a low saturated fat diet, exercise and weight loss. Last LDL was of 136 and HDL of 44. She denies any chest pain, claudication or myalgias.  ASSESSMENT AND PLAN:  Insulin resistance  Other hyperlipidemia  Class 3 severe obesity with  serious comorbidity and body mass index (BMI) of 40.0 to 44.9 in adult, unspecified obesity type (HCC)  PLAN:  Insulin Resistance Tracey Lindsey will continue to work on weight loss, exercise, and decreasing simple carbohydrates in her diet to help decrease the risk of diabetes. We dicussed metformin including benefits and risks. She was informed that eating too many simple carbohydrates or too many calories at one sitting increases the likelihood of GI side effects. Tracey Lindsey agrees to continue taking metformin and we will repeat labs in mid June. Tracey Lindsey agrees to follow up with our clinic in 2 weeks as directed to monitor her progress.  Hyperlipidemia Tracey Lindsey was informed of the American Heart Association Guidelines emphasizing intensive lifestyle modifications as the first line treatment for hyperlipidemia. We discussed many lifestyle modifications today in depth, and Tracey Lindsey will continue to work on decreasing saturated fats such as fatty red meat, butter and many fried foods. She will also increase vegetables and lean protein in her diet and continue to work on exercise and weight loss efforts. We will repeat labs at the end of June. Tracey Lindsey agrees to follow up with our clinic in 2 weeks.  Obesity Tracey Lindsey is currently in the action stage of change. As such, her goal is to continue with weight loss efforts She has agreed to follow the Category 3 plan Tracey Lindsey has been instructed to work up to a goal of 150 minutes of combined cardio and strengthening exercise per week or walk in the morning before work for weight loss and overall health benefits. We discussed the following  Behavioral Modification Strategies today: increasing lean protein intake, increasing vegetables and work on meal planning and easy cooking plans, better snacking choices, and planning for success   Tracey Lindsey has agreed to follow up with our clinic in 2 weeks. She was informed of the importance of frequent follow up visits to  maximize her success with intensive lifestyle modifications for her multiple health conditions.  ALLERGIES: No Known Allergies  MEDICATIONS: Current Outpatient Medications on File Prior to Visit  Medication Sig Dispense Refill  . cholecalciferol (VITAMIN D) 1000 units tablet Take 1,000 Units by mouth daily.    Marland Kitchen LORazepam (ATIVAN) 0.5 MG tablet Take 1 tablet (0.5 mg total) by mouth 2 (two) times daily as needed for anxiety. 15 tablet 1  . metFORMIN (GLUCOPHAGE) 500 MG tablet Take 1 tablet (500 mg total) by mouth daily with breakfast. 30 tablet 0  . natalizumab (TYSABRI) 300 MG/15ML injection Inject 15 mLs into the vein every 30 (thirty) days.     . Norethindrone Acetate-Ethinyl Estradiol (LOESTRIN 1.5/30, 21,) 1.5-30 MG-MCG tablet Take 1 tablet by mouth daily. 3 Package 2  . vitamin C (ASCORBIC ACID) 500 MG tablet Take 500 mg by mouth daily.    Marland Kitchen VITAMIN D PO Take 5,000 Units by mouth daily.    . Vitamin D, Ergocalciferol, (DRISDOL) 1.25 MG (50000 UT) CAPS capsule Take 1 capsule (50,000 Units total) by mouth every 7 (seven) days. 4 capsule 0   Current Facility-Administered Medications on File Prior to Visit  Medication Dose Route Frequency Provider Last Rate Last Dose  . gadopentetate dimeglumine (MAGNEVIST) injection 18 mL  18 mL Intravenous Once PRN Sater, Pearletha Furl, MD        PAST MEDICAL HISTORY: Past Medical History:  Diagnosis Date  . Anxiety   . Chlamydia   . Depression   . History of abnormal cervical Papanicolaou smear   . MS (multiple sclerosis) [facility]) April 2016  . PCOS (polycystic ovarian syndrome)   . Vitamin D deficiency     PAST SURGICAL HISTORY: History reviewed. No pertinent surgical history.  SOCIAL HISTORY: Social History   Tobacco Use  . Smoking status: Never Smoker  . Smokeless tobacco: Never Used  Substance Use Topics  . Alcohol use: Yes    Comment: occasionally  . Drug use: Never    FAMILY HISTORY: Family History  Problem Relation Age of Onset   . Diabetes Father   . Hypertension Father   . Stroke Father   . Cancer Mother        lung  . Hyperlipidemia Mother   . Lung disease Mother   . Eczema Brother     ROS: Review of Systems  Constitutional: Positive for weight loss.  Cardiovascular: Negative for chest pain and claudication.  Musculoskeletal: Negative for myalgias.    PHYSICAL EXAM: Pt in no acute distress  RECENT LABS AND TESTS: BMET    Component Value Date/Time   NA 138 02/03/2019 0933   K 4.3 02/03/2019 0933   CL 103 02/03/2019 0933   CO2 22 02/03/2019 0933   GLUCOSE 86 02/03/2019 0933   GLUCOSE 87 04/24/2018 1138   BUN 8 02/03/2019 0933   CREATININE 0.79 02/03/2019 0933   CREATININE 0.75 04/24/2018 1138   CALCIUM 9.3 02/03/2019 0933   GFRNONAA 103 02/03/2019 0933   GFRNONAA 110 04/24/2018 1138   GFRAA 119 02/03/2019 0933   GFRAA 127 04/24/2018 1138   Lab Results  Component Value Date   HGBA1C 5.5 02/03/2019   Lab Results  Component Value Date   INSULIN 14.9 02/03/2019   CBC    Component Value Date/Time   WBC 9.7 02/02/2019 1201   WBC 8.4 04/24/2018 1138   RBC 4.37 02/02/2019 1201   RBC 4.60 04/24/2018 1138   HGB 13.5 02/02/2019 1201   HCT 39.6 02/02/2019 1201   PLT 285 02/02/2019 1201   MCV 91 02/02/2019 1201   MCH 30.9 02/02/2019 1201   MCH 30.4 04/24/2018 1138   MCHC 34.1 02/02/2019 1201   MCHC 34.2 04/24/2018 1138   RDW 12.9 02/02/2019 1201   LYMPHSABS 4.0 (H) 02/02/2019 1201   MONOABS 0.3 03/31/2015 1015   EOSABS 0.3 02/02/2019 1201   BASOSABS 0.1 02/02/2019 1201   Iron/TIBC/Ferritin/ %Sat No results found for: IRON, TIBC, FERRITIN, IRONPCTSAT Lipid Panel     Component Value Date/Time   CHOL 205 (H) 02/03/2019 0933   TRIG 125 02/03/2019 0933   HDL 44 02/03/2019 0933   CHOLHDL 4.1 04/24/2018 1138   LDLCALC 136 (H) 02/03/2019 0933   LDLCALC 131 (H) 04/24/2018 1138   Hepatic Function Panel     Component Value Date/Time   PROT 6.7 02/03/2019 0933   ALBUMIN 4.4  02/03/2019 0933   AST 18 02/03/2019 0933   ALT 11 02/03/2019 0933   ALKPHOS 45 02/03/2019 0933   BILITOT 0.3 02/03/2019 0933      Component Value Date/Time   TSH 2.440 02/03/2019 0933   TSH 1.780 07/24/2018 1441   TSH 1.29 04/24/2018 1138      I, Burt Knack, am acting as transcriptionist for Debbra Riding, MD  I have reviewed the above documentation for accuracy and completeness, and I agree with the above. - Debbra Riding, MD

## 2019-04-07 ENCOUNTER — Ambulatory Visit (INDEPENDENT_AMBULATORY_CARE_PROVIDER_SITE_OTHER): Payer: Self-pay | Admitting: Psychology

## 2019-04-13 ENCOUNTER — Telehealth (INDEPENDENT_AMBULATORY_CARE_PROVIDER_SITE_OTHER): Payer: Self-pay | Admitting: Psychology

## 2019-04-13 NOTE — Telephone Encounter (Signed)
  Office: [phone]  /  Fax: [phone]  Date of Call: Apr 13, 2019 Time of Call: 2:54pm Duration of Call: 1 minute Provider: Lawerance Cruel, PsyD  CONTENT: This provider called to check-in as Tracey Lindsey canceled the last appointment on Apr 07, 2019. She noted a desire to discontinue services at this time due to work. Tracey Lindsey shared she would call when she wishes to re-initiate services. A brief risk assessment was completed. Tracey Lindsey denied experiencing suicidal and homicidal ideation, plan, and intent since the last appointment with this provider.  PLAN: This provider will wait for Tracey Lindsey to call back should she wish to re-initiate services.

## 2019-04-15 ENCOUNTER — Other Ambulatory Visit: Payer: Self-pay

## 2019-04-15 ENCOUNTER — Encounter (INDEPENDENT_AMBULATORY_CARE_PROVIDER_SITE_OTHER): Payer: Self-pay | Admitting: Family Medicine

## 2019-04-15 ENCOUNTER — Ambulatory Visit (INDEPENDENT_AMBULATORY_CARE_PROVIDER_SITE_OTHER): Payer: 59 | Admitting: Family Medicine

## 2019-04-15 DIAGNOSIS — E8881 Metabolic syndrome: Secondary | ICD-10-CM

## 2019-04-15 DIAGNOSIS — Z6841 Body Mass Index (BMI) 40.0 and over, adult: Secondary | ICD-10-CM | POA: Diagnosis not present

## 2019-04-15 DIAGNOSIS — E559 Vitamin D deficiency, unspecified: Secondary | ICD-10-CM

## 2019-04-15 NOTE — Progress Notes (Signed)
Office: [phone]  /  Fax: [phone] TeleHealth Visit:  Tracey Lindsey has verbally consented to this TeleHealth visit today. The patient is located at home, the provider is located at the [facility] office. The participants in this visit include the listed provider and patient. The visit was conducted today via face time.  HPI:   Chief Complaint: OBESITY Tracey Lindsey is here to discuss her progress with her obesity treatment plan. She is on the Category 3 plan and is following her eating plan approximately 60 % of the time. She states she is exercising 0 minutes 0 times per week. Tracey Lindsey had a difficult few weeks, not eating on the plan for the past two weeks. She voices that she has struggled with following the plan and structure, secondary to lack of meal planning.  We were unable to weigh the patient today for this TeleHealth visit. She feels as if she has maintained her weight since her last visit. She has lost 4 lbs since starting treatment with us.  Insulin Resistance Tracey Lindsey has a diagnosis of insulin resistance based on her elevated fasting insulin level >5. Although [name]'s blood glucose readings are still under good control, insulin resistance puts her at greater risk of metabolic syndrome and diabetes. She hasn't started metformin yet and notes carbohydrate cravings. She continues to work on diet and exercise to decrease risk of diabetes.  Vitamin D Deficiency Tracey Lindsey has a diagnosis of vitamin D deficiency. She is currently taking prescription Vit D. She notes fatigue and denies nausea, vomiting or muscle weakness.  ASSESSMENT AND PLAN:  Insulin resistance  Vitamin D deficiency  Class 3 severe obesity with serious comorbidity and body mass index (BMI) of 40.0 to 44.9 in adult, unspecified obesity type (HCC)  PLAN:  Insulin Resistance Tracey Lindsey will continue to work on weight loss, exercise, and decreasing simple carbohydrates in her diet to help  decrease the risk of diabetes. We dicussed metformin including benefits and risks. She was informed that eating too many simple carbohydrates or too many calories at one sitting increases the likelihood of GI side effects. Tracey Lindsey is to start metformin on her day off, and she agrees to follow up with our clinic in 3 weeks as directed to monitor her progress.  Vitamin D Deficiency Tracey Lindsey was informed that low vitamin D levels contributes to fatigue and are associated with obesity, breast, and colon cancer. Tracey Lindsey agrees to continue taking prescription Vit D @50 ,000 IU every week and will follow up for routine testing of vitamin D, at least 2-3 times per year. She was informed of the risk of over-replacement of vitamin D and agrees to not increase her dose unless she discusses this with us first. Tracey Lindsey agrees to follow up with our clinic in 3 weeks.  Obesity Tracey Lindsey is currently in the action stage of change. As such, her goal is to continue with weight loss efforts She has agreed to keep a food journal with 1450-1600 calories and 100+ grams of protein daily Tracey Lindsey has been instructed to work up to a goal of 150 minutes of combined cardio and strengthening exercise per week for weight loss and overall health benefits. We discussed the following Behavioral Modification Strategies today: increasing lean protein intake, increasing vegetables and work on meal planning and easy cooking plans, keeping healthy foods in the home, planning for success, and keep a strict food journal   Tracey Lindsey has agreed to follow up with our clinic in 3 weeks. She was informed of the  importance of frequent follow up visits to maximize her success with intensive lifestyle modifications for her multiple health conditions.  ALLERGIES: No Known Allergies  MEDICATIONS: Current Outpatient Medications on File Prior to Visit  Medication Sig Dispense Refill  . cholecalciferol (VITAMIN D) 1000 units tablet Take 1,000  Units by mouth daily.    Marland Kitchen LORazepam (ATIVAN) 0.5 MG tablet Take 1 tablet (0.5 mg total) by mouth 2 (two) times daily as needed for anxiety. 15 tablet 1  . metFORMIN (GLUCOPHAGE) 500 MG tablet Take 1 tablet (500 mg total) by mouth daily with breakfast. 30 tablet 0  . natalizumab (TYSABRI) 300 MG/15ML injection Inject 15 mLs into the vein every 30 (thirty) days.     . Norethindrone Acetate-Ethinyl Estradiol (LOESTRIN 1.5/30, 21,) 1.5-30 MG-MCG tablet Take 1 tablet by mouth daily. 3 Package 2  . vitamin C (ASCORBIC ACID) 500 MG tablet Take 500 mg by mouth daily.    Marland Kitchen VITAMIN D PO Take 5,000 Units by mouth daily.    . Vitamin D, Ergocalciferol, (DRISDOL) 1.25 MG (50000 UT) CAPS capsule Take 1 capsule (50,000 Units total) by mouth every 7 (seven) days. 4 capsule 0   Current Facility-Administered Medications on File Prior to Visit  Medication Dose Route Frequency Provider Last Rate Last Dose  . gadopentetate dimeglumine (MAGNEVIST) injection 18 mL  18 mL Intravenous Once PRN Sater, Pearletha Furl, MD        PAST MEDICAL HISTORY: Past Medical History:  Diagnosis Date  . Anxiety   . Chlamydia   . Depression   . History of abnormal cervical Papanicolaou smear   . MS (multiple sclerosis) [facility]) April 2016  . PCOS (polycystic ovarian syndrome)   . Vitamin D deficiency     PAST SURGICAL HISTORY: History reviewed. No pertinent surgical history.  SOCIAL HISTORY: Social History   Tobacco Use  . Smoking status: Never Smoker  . Smokeless tobacco: Never Used  Substance Use Topics  . Alcohol use: Yes    Comment: occasionally  . Drug use: Never    FAMILY HISTORY: Family History  Problem Relation Age of Onset  . Diabetes Father   . Hypertension Father   . Stroke Father   . Cancer Mother        lung  . Hyperlipidemia Mother   . Lung disease Mother   . Eczema Brother     ROS: Review of Systems  Constitutional: Positive for malaise/fatigue. Negative for weight loss.  Gastrointestinal:  Negative for nausea and vomiting.  Musculoskeletal:       Negative muscle weakness    PHYSICAL EXAM: Pt in no acute distress  RECENT LABS AND TESTS: BMET    Component Value Date/Time   NA 138 02/03/2019 0933   K 4.3 02/03/2019 0933   CL 103 02/03/2019 0933   CO2 22 02/03/2019 0933   GLUCOSE 86 02/03/2019 0933   GLUCOSE 87 04/24/2018 1138   BUN 8 02/03/2019 0933   CREATININE 0.79 02/03/2019 0933   CREATININE 0.75 04/24/2018 1138   CALCIUM 9.3 02/03/2019 0933   GFRNONAA 103 02/03/2019 0933   GFRNONAA 110 04/24/2018 1138   GFRAA 119 02/03/2019 0933   GFRAA 127 04/24/2018 1138   Lab Results  Component Value Date   HGBA1C 5.5 02/03/2019   Lab Results  Component Value Date   INSULIN 14.9 02/03/2019   CBC    Component Value Date/Time   WBC 9.7 02/02/2019 1201   WBC 8.4 04/24/2018 1138   RBC 4.37 02/02/2019 1201  RBC 4.60 04/24/2018 1138   HGB 13.5 02/02/2019 1201   HCT 39.6 02/02/2019 1201   PLT 285 02/02/2019 1201   MCV 91 02/02/2019 1201   MCH 30.9 02/02/2019 1201   MCH 30.4 04/24/2018 1138   MCHC 34.1 02/02/2019 1201   MCHC 34.2 04/24/2018 1138   RDW 12.9 02/02/2019 1201   LYMPHSABS 4.0 (H) 02/02/2019 1201   MONOABS 0.3 03/31/2015 1015   EOSABS 0.3 02/02/2019 1201   BASOSABS 0.1 02/02/2019 1201   Iron/TIBC/Ferritin/ %Sat No results found for: IRON, TIBC, FERRITIN, IRONPCTSAT Lipid Panel     Component Value Date/Time   CHOL 205 (H) 02/03/2019 0933   TRIG 125 02/03/2019 0933   HDL 44 02/03/2019 0933   CHOLHDL 4.1 04/24/2018 1138   LDLCALC 136 (H) 02/03/2019 0933   LDLCALC 131 (H) 04/24/2018 1138   Hepatic Function Panel     Component Value Date/Time   PROT 6.7 02/03/2019 0933   ALBUMIN 4.4 02/03/2019 0933   AST 18 02/03/2019 0933   ALT 11 02/03/2019 0933   ALKPHOS 45 02/03/2019 0933   BILITOT 0.3 02/03/2019 0933      Component Value Date/Time   TSH 2.440 02/03/2019 0933   TSH 1.780 07/24/2018 1441   TSH 1.29 04/24/2018 1138      I,  Burt Knack, am acting as transcriptionist for Debbra Riding, MD  I have reviewed the above documentation for accuracy and completeness, and I agree with the above. - Debbra Riding, MD

## 2019-04-20 ENCOUNTER — Telehealth: Payer: Self-pay | Admitting: *Deleted

## 2019-04-20 NOTE — Telephone Encounter (Signed)
Faxed completed/signed Tysabri pt status report and reauth questionnaire to [facility] at [phone]. Received confirmation.  

## 2019-04-20 NOTE — Telephone Encounter (Signed)
Received fax notification that pt re-authorized from 04/20/19-11/19/19. Enrollment number: [id number]. Account: [facility]. Site auth number: [id number].

## 2019-04-28 ENCOUNTER — Encounter: Payer: Self-pay | Admitting: Nurse Practitioner

## 2019-04-28 ENCOUNTER — Other Ambulatory Visit: Payer: Self-pay

## 2019-04-28 ENCOUNTER — Encounter: Payer: 59 | Admitting: Family Medicine

## 2019-04-28 ENCOUNTER — Other Ambulatory Visit (HOSPITAL_COMMUNITY)
Admission: RE | Admit: 2019-04-28 | Discharge: 2019-04-28 | Disposition: A | Discharge status: Home / Self Care | Payer: 59 | Source: Ambulatory Visit | Attending: Nurse Practitioner | Admitting: Nurse Practitioner

## 2019-04-28 ENCOUNTER — Ambulatory Visit (INDEPENDENT_AMBULATORY_CARE_PROVIDER_SITE_OTHER): Payer: 59 | Admitting: Nurse Practitioner

## 2019-04-28 VITALS — BP 116/70 | HR 97 | Temp 98.6°F | Resp 14 | Ht 63.0 in | Wt 233.7 lb

## 2019-04-28 DIAGNOSIS — G35 Multiple sclerosis: Secondary | ICD-10-CM | POA: Diagnosis not present

## 2019-04-28 DIAGNOSIS — Z113 Encounter for screening for infections with a predominantly sexual mode of transmission: Secondary | ICD-10-CM

## 2019-04-28 DIAGNOSIS — E8881 Metabolic syndrome: Secondary | ICD-10-CM

## 2019-04-28 DIAGNOSIS — Z0000 Encounter for general adult medical examination without abnormal findings: Secondary | ICD-10-CM | POA: Diagnosis not present

## 2019-04-28 DIAGNOSIS — E7849 Other hyperlipidemia: Secondary | ICD-10-CM | POA: Diagnosis not present

## 2019-04-28 MED ORDER — METFORMIN HCL ER 500 MG PO TB24: 500.0000 mg | ORAL_TABLET | Freq: Every day | ORAL | 1 refills | Status: DC

## 2019-04-28 NOTE — Progress Notes (Signed)
Name: Tracey Lindsey   MRN: [id number]    DOB: 1991-01-05   Date:04/28/2019       Progress Note  Subjective  Chief Complaint  Chief Complaint  Patient presents with  . Annual Exam    HPI  Patient presents for annual CPE .  Diet:  Breakfast- skips occasionally, usually has cereal and fruit or eggs Lunch: pizza, typically varies- just eats what she wants to Dinner: pizza, typically lunch leftovers, occasionally cooks.  [facility]- states knows what she should do but is not doing it right now.  Does not snack often. Drinks lemonade and some water- at least 24 ounces.   Exercise: no routine exercise, walks here and there 30 minutes tops.   USPSTF grade A and B recommendations    Office Visit from 04/28/2019 in [facility]  AUDIT-C Score  0     Hypertension: BP Readings from Last 3 Encounters:  04/28/19 116/70  02/17/19 108/75  02/03/19 112/77   Obesity: Wt Readings from Last 3 Encounters:  04/28/19 233 lb 11.2 oz (106 kg)  02/17/19 227 lb (103 kg)  02/03/19 231 lb (104.8 kg)   BMI Readings from Last 3 Encounters:  04/28/19 41.40 kg/m  02/17/19 40.21 kg/m  02/03/19 40.92 kg/m    Hep C Screening: completed.  STD testing and prevention (HIV/chl/gon/syphilis): will test today.  Intimate partner violence: declines  Sexual History/Pain during Intercourse: sexual active with one partner who had vasectomy, does not wear protection every time.  Menstrual History/LMP/Abnormal Bleeding: denies  Incontinence Symptoms: denies   Advanced Care Planning: A voluntary discussion about advance care planning including the explanation and discussion of advance directives.  Discussed health care proxy and Living will, and the patient was able to identify a health care proxy as mom Lavinia Sharps.  Patient does not have a living will at present time. If patient does have living will, I have requested they bring this to the clinic to be scanned in  to their chart.  Breast cancer: no family history,  Cervical cancer screening: due in December with GYN   Osteoporosis Screening:  No results found for: HMDEXASCAN  Lipids:  Lab Results  Component Value Date   CHOL 205 (H) 02/03/2019   CHOL 200 (H) 04/24/2018   CHOL 175 11/14/2015   Lab Results  Component Value Date   HDL 44 02/03/2019   HDL 49 (L) 04/24/2018   HDL 52 11/14/2015   Lab Results  Component Value Date   LDLCALC 136 (H) 02/03/2019   LDLCALC 131 (H) 04/24/2018   LDLCALC 111 (H) 11/14/2015   Lab Results  Component Value Date   TRIG 125 02/03/2019   TRIG 97 04/24/2018   TRIG 61 11/14/2015   Lab Results  Component Value Date   CHOLHDL 4.1 04/24/2018   No results found for: LDLDIRECT  Glucose:  Glucose  Date Value Ref Range Status  02/03/2019 86 65 - 99 mg/dL Final  40/98/1191 73 65 - 99 mg/dL Final   Glucose, Bld  Date Value Ref Range Status  04/24/2018 87 65 - 139 mg/dL Final    Comment:    .        Non-fasting reference interval .   03/31/2015 94 70 - 99 mg/dL Final  47/82/9562 130 (H) 70 - 99 mg/dL Final    Skin cancer: discussed    Patient Active Problem List   Diagnosis Date Noted  . Insulin resistance 02/18/2019  . Screen for STD (  sexually transmitted disease) 03/01/2017  . Encounter for screening for HIV 03/01/2017  . High risk medication use 01/24/2017  . Other headache syndrome 07/24/2016  . Obesity 11/19/2015  . Preventative health care 11/14/2015  . Oral contraceptive prescribed 06/08/2015  . Verruca vulgaris 06/08/2015  . Depression with anxiety 05/26/2015  . Weakness of left arm 05/26/2015  . Vertigo 04/04/2015  . Numbness 04/04/2015  . Vitamin D deficiency 04/04/2015  . 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 Anxiety state 04/04/2015  . Insomnia 04/04/2015  . Other fatigue 04/04/2015  . MS (multiple sclerosis) (HCC) 03/04/2015    History reviewed. No pertinent surgical history.  Family History  Problem Relation Age of Onset   . Diabetes Father   . Hypertension Father   . Stroke Father   . Cancer Mother        lung  . Hyperlipidemia Mother   . Lung disease Mother   . Eczema Brother     Social History   Socioeconomic History  . Marital status: Single    Spouse name: Not on file  . Number of children: Not on file  . Years of education: 59  . Highest education level: Master's degree (e.g., MA, MS, MEng, MEd, MSW, MBA)  Occupational History  . Occupation: Visual merchandiser  Social Needs  . Financial resource strain: Not hard at all  . Food insecurity:    Worry: Never true    Inability: Never true  . Transportation needs:    Medical: No    Non-medical: No  Tobacco Use  . Smoking status: Never Smoker  . Smokeless tobacco: Never Used  Substance and Sexual Activity  . Alcohol use: Yes    Comment: occasionally  . Drug use: Never  . Sexual activity: Yes    Partners: Male    Birth control/protection: Condom  Lifestyle  . Physical activity:    Days per week: 0 days    Minutes per session: 0 min  . Stress: Not at all  Relationships  . Social connections:    Talks on phone: More than three times a week    Gets together: Once a week    Attends religious service: 1 to 4 times per year    Active member of club or organization: No    Attends meetings of clubs or organizations: Never    Relationship status: Never married  . Intimate partner violence:    Fear of current or ex partner: No    Emotionally abused: No    Physically abused: No    Forced sexual activity: No  Other Topics Concern  . Not on file  Social History Narrative   Lives with mother    Drinks soda daily (8-10 ounces)     Current Outpatient Medications:  .  cholecalciferol (VITAMIN D) 1000 units tablet, Take 1,000 Units by mouth daily., Disp: , Rfl:  .  LORazepam (ATIVAN) 0.5 MG tablet, Take 1 tablet (0.5 mg total) by mouth 2 (two) times daily as needed for anxiety., Disp: 15 tablet, Rfl: 1 .  natalizumab (TYSABRI) 300  MG/15ML injection, Inject 15 mLs into the vein every 30 (thirty) days. , Disp: , Rfl:  .  vitamin C (ASCORBIC ACID) 500 MG tablet, Take 500 mg by mouth daily., Disp: , Rfl:  .  VITAMIN D PO, Take 5,000 Units by mouth daily., Disp: , Rfl:  .  metFORMIN (GLUCOPHAGE) 500 MG tablet, Take 1 tablet (500 mg total) by mouth daily with breakfast. (Patient not taking: Reported on  04/28/2019), Disp: 30 tablet, Rfl: 0 .  Norethindrone Acetate-Ethinyl Estradiol (LOESTRIN 1.5/30, 21,) 1.5-30 MG-MCG tablet, Take 1 tablet by mouth daily. (Patient not taking: Reported on 04/28/2019), Disp: 3 Package, Rfl: 2 .  Vitamin D, Ergocalciferol, (DRISDOL) 1.25 MG (50000 UT) CAPS capsule, Take 1 capsule (50,000 Units total) by mouth every 7 (seven) days. (Patient not taking: Reported on 04/28/2019), Disp: 4 capsule, Rfl: 0 No current facility-administered medications for this visit.   Facility-Administered Medications Ordered in Other Visits:  .  gadopentetate dimeglumine (MAGNEVIST) injection 18 mL, 18 mL, Intravenous, Once PRN, Sater, Pearletha Furl, MD  No Known Allergies   Review of Systems  Constitutional: Negative for chills, fever and malaise/fatigue.  HENT: Negative for congestion, sinus pain and sore throat.   Eyes: Negative for blurred vision and double vision.  Respiratory: Negative for cough and shortness of breath.   Cardiovascular: Negative for chest pain, palpitations and leg swelling.  Gastrointestinal: Negative for abdominal pain, constipation, diarrhea and nausea.  Genitourinary: Negative for dysuria and hematuria.  Musculoskeletal: Negative for falls and joint pain.  Skin: Negative for rash.  Neurological: Negative for dizziness and headaches.  Endo/Heme/Allergies: Negative for polydipsia.  Psychiatric/Behavioral: The patient is not nervous/anxious and does not have insomnia.      Objective  Vitals:   04/28/19 1103  BP: 116/70  Pulse: 97  Resp: 14  Temp: 98.6 F (37 C)  TempSrc: Oral  SpO2:  99%  Weight: 233 lb 11.2 oz (106 kg)  Height: 5\' 3"  (1.6 m)    Body mass index is 41.4 kg/m.  Physical Exam    Recent Results (from the past 2160 hour(s))  CBC with Differential/Platelet     Status: Abnormal   Collection Time: 02/02/19 12:01 PM  Result Value Ref Range   WBC 9.7 3.4 - 10.8 x10E3/uL   RBC 4.37 3.77 - 5.28 x10E6/uL   Hemoglobin 13.5 11.1 - 15.9 g/dL   Hematocrit 40.8 14.4 - 46.6 %   MCV 91 79 - 97 fL   MCH 30.9 26.6 - 33.0 pg   MCHC 34.1 31.5 - 35.7 g/dL   RDW 81.8 56.3 - 14.9 %   Platelets 285 150 - 450 x10E3/uL   Neutrophils 50 Not Estab. %   Lymphs 41 Not Estab. %   Monocytes 5 Not Estab. %   Eos 3 Not Estab. %   Basos 1 Not Estab. %   Neutrophils Absolute 4.8 1.4 - 7.0 x10E3/uL   Lymphocytes Absolute 4.0 (H) 0.7 - 3.1 x10E3/uL   Monocytes Absolute 0.5 0.1 - 0.9 x10E3/uL   EOS (ABSOLUTE) 0.3 0.0 - 0.4 x10E3/uL   Basophils Absolute 0.1 0.0 - 0.2 x10E3/uL   Immature Granulocytes 0 Not Estab. %   Immature Grans (Abs) 0.0 0.0 - 0.1 x10E3/uL  VITAMIN D 25 Hydroxy (Vit-D Deficiency, Fractures)     Status: None   Collection Time: 02/02/19 12:01 PM  Result Value Ref Range   Vit D, 25-Hydroxy 31.9 30.0 - 100.0 ng/mL    Comment: Vitamin D deficiency has been defined by the Institute of Medicine and an Endocrine Society practice guideline as a level of serum 25-OH vitamin D less than 20 ng/mL (1,2). The Endocrine Society went on to further define vitamin D insufficiency as a level between 21 and 29 ng/mL (2). 1. IOM (Institute of Medicine). 2010. Dietary reference    intakes for calcium and D. [place]: The    [employer]. 2. Holick MF, Binkley [place], Bischoff-Ferrari HA, et al.  Evaluation, treatment, and prevention of vitamin D    deficiency: an Endocrine Society clinical practice    guideline. JCEM. 2011 Jul; 96(7):1911-30.   Comprehensive metabolic panel     Status: None   Collection Time: 02/03/19  9:33 AM  Result Value Ref Range    Glucose 86 65 - 99 mg/dL   BUN 8 6 - 20 mg/dL   Creatinine, Ser 7.42 0.57 - 1.00 mg/dL   GFR calc non Af Amer 103 >59 mL/min/1.73   GFR calc Af Amer 119 >59 mL/min/1.73   BUN/Creatinine Ratio 10 9 - 23   Sodium 138 134 - 144 mmol/L   Potassium 4.3 3.5 - 5.2 mmol/L   Chloride 103 96 - 106 mmol/L   CO2 22 20 - 29 mmol/L   Calcium 9.3 8.7 - 10.2 mg/dL   Total Protein 6.7 6.0 - 8.5 g/dL   Albumin 4.4 3.9 - 5.0 g/dL   Globulin, Total 2.3 1.5 - 4.5 g/dL   Albumin/Globulin Ratio 1.9 1.2 - 2.2   Bilirubin Total 0.3 0.0 - 1.2 mg/dL   Alkaline Phosphatase 45 39 - 117 IU/L   AST 18 0 - 40 IU/L   ALT 11 0 - 32 IU/L  Hemoglobin A1c     Status: None   Collection Time: 02/03/19  9:33 AM  Result Value Ref Range   Hgb A1c MFr Bld 5.5 4.8 - 5.6 %    Comment:          Prediabetes: 5.7 - 6.4          Diabetes: >6.4          Glycemic control for adults with diabetes: <7.0    Est. average glucose Bld gHb Est-mCnc 111 mg/dL  Insulin, random     Status: None   Collection Time: 02/03/19  9:33 AM  Result Value Ref Range   INSULIN 14.9 2.6 - 24.9 uIU/mL  Lipid Panel With LDL/HDL Ratio     Status: Abnormal   Collection Time: 02/03/19  9:33 AM  Result Value Ref Range   Cholesterol, Total 205 (H) 100 - 199 mg/dL   Triglycerides 595 0 - 149 mg/dL   HDL 44 >63 mg/dL   VLDL Cholesterol Cal 25 5 - 40 mg/dL   LDL Calculated 875 (H) 0 - 99 mg/dL   LDl/HDL Ratio 3.1 0.0 - 3.2 ratio    Comment:                                     LDL/HDL Ratio                                             Men  Women                               1/2 Avg.Risk  1.0    1.5                                   Avg.Risk  3.6    3.2  2X Avg.Risk  6.2    5.0                                3X Avg.Risk  8.0    6.1   T3     Status: None   Collection Time: 02/03/19  9:33 AM  Result Value Ref Range   T3, Total 154 71 - 180 ng/dL  T4, free     Status: None   Collection Time: 02/03/19  9:33 AM  Result  Value Ref Range   Free T4 1.27 0.82 - 1.77 ng/dL  TSH     Status: None   Collection Time: 02/03/19  9:33 AM  Result Value Ref Range   TSH 2.440 0.450 - 4.500 uIU/mL     PHQ2/9: [facility] 04/28/2019 03/25/2019 02/17/2019 02/03/2019 04/24/2018  Decreased Interest 0 0 1 3 0  Down, Depressed, Hopeless 0 0 1 3 0  PHQ - 2 Score 0 0 2 6 0  Altered sleeping 0 0 1 2 -  Tired, decreased energy 0 0 1 3 -  Change in appetite 0 0 1 3 -  Feeling bad or failure about yourself  0 0 1 1 -  Trouble concentrating 0 0 0 0 -  Moving slowly or fidgety/restless 0 0 0 - -  Suicidal thoughts 0 0 0 0 -  PHQ-9 Score 0 0 6 15 -  Difficult doing work/chores Not difficult at all - - Somewhat difficult -     Fall Risk: Fall Risk  04/28/2019 06/17/2018 04/24/2018 08/23/2017 03/01/2017  Falls in the past year? 0 No No No No     Functional Status Survey: Is the patient deaf or have difficulty hearing?: No Does the patient have difficulty seeing, even when wearing glasses/contacts?: No Does the patient have difficulty concentrating, remembering, or making decisions?: No Does the patient have difficulty walking or climbing stairs?: No Does the patient have difficulty dressing or bathing?: No Does the patient have difficulty doing errands alone such as visiting a doctor's office or shopping?: No  Assessment & Plan  1. Preventative health care - Cervicovaginal ancillary only - HIV antibody (with reflex) - Hepatitis C Antibody - Lipid Profile - COMPLETE METABOLIC PANEL WITH GFR  2. Screen for STD (sexually transmitted disease) - Cervicovaginal ancillary only - RPR - HIV antibody (with reflex) - Hepatitis C Antibody  3. Insulin resistance Trial extended release metformin  - metFORMIN (GLUCOPHAGE XR) 500 MG 24 hr tablet; Take 1 tablet (500 mg total) by mouth daily with breakfast.  Dispense: 90 tablet; Refill: 1 - COMPLETE METABOLIC PANEL WITH GFR  4. Other hyperlipidemia Discussed diet -  Lipid Profile  -USPSTF grade A and B recommendations reviewed with patient; age-appropriate recommendations, preventive care, screening tests, etc discussed and encouraged; healthy living encouraged; see AVS for patient education given to patient -Discussed importance of 150 minutes of physical activity weekly, eat two servings of fish weekly, eat one serving of tree nuts ( cashews, pistachios, pecans, almonds.Marland Kitchen.) every other day, eat 6 servings of fruit/vegetables daily and drink plenty of water and avoid sweet beverages.

## 2019-04-28 NOTE — Patient Instructions (Signed)

## 2019-04-28 NOTE — Assessment & Plan Note (Signed)
Follows Dr. Epimenio Foot with [facility], gets monthly infusion

## 2019-04-29 LAB — LIPID PANEL
Cholesterol: 177 mg/dL (ref <200)
HDL: 44 mg/dL — ABNORMAL LOW (ref >=50)
LDL Cholesterol (Calc): 113 mg/dL (calc) — ABNORMAL HIGH
Non-HDL Cholesterol (Calc): 133 mg/dL (calc) — ABNORMAL HIGH (ref <130)
Total CHOL/HDL Ratio: 4 (calc) (ref <5.0)
Triglycerides: 98 mg/dL (ref <150)

## 2019-04-29 LAB — COMPLETE METABOLIC PANEL WITH GFR
AG Ratio: 1.8 (calc) (ref 1.0–2.5)
ALT: 13 U/L (ref 6–29)
AST: 16 U/L (ref 10–30)
Albumin: 4.4 g/dL (ref 3.6–5.1)
Alkaline phosphatase (APISO): 40 U/L (ref 31–125)
BUN: 8 mg/dL (ref 7–25)
CO2: 26 mmol/L (ref 20–32)
Calcium: 9.4 mg/dL (ref 8.6–10.2)
Chloride: 106 mmol/L (ref 98–110)
Creat: 0.75 mg/dL (ref 0.50–1.10)
GFR, Est African American: 127 mL/min/{1.73_m2} (ref >=60)
GFR, Est Non African American: 109 mL/min/{1.73_m2} (ref >=60)
Globulin: 2.4 g/dL (calc) (ref 1.9–3.7)
Glucose, Bld: 82 mg/dL (ref 65–99)
Potassium: 3.7 mmol/L (ref 3.5–5.3)
Sodium: 139 mmol/L (ref 135–146)
Total Bilirubin: 0.4 mg/dL (ref 0.2–1.2)
Total Protein: 6.8 g/dL (ref 6.1–8.1)

## 2019-04-29 LAB — RPR: RPR Ser Ql: NONREACTIVE

## 2019-04-29 LAB — HEPATITIS C ANTIBODY
Hepatitis C Ab: NONREACTIVE
SIGNAL TO CUT-OFF: 0.01 (ref <1.00)

## 2019-04-29 LAB — HIV ANTIBODY (ROUTINE TESTING W REFLEX): HIV 1&2 Ab, 4th Generation: NONREACTIVE

## 2019-04-30 LAB — CERVICOVAGINAL ANCILLARY ONLY
Bacterial vaginitis: NEGATIVE
Candida vaginitis: NEGATIVE
Chlamydia: NEGATIVE
Neisseria Gonorrhea: NEGATIVE
Trichomonas: NEGATIVE

## 2019-05-05 ENCOUNTER — Ambulatory Visit (INDEPENDENT_AMBULATORY_CARE_PROVIDER_SITE_OTHER): Payer: 59 | Admitting: Family Medicine

## 2019-05-08 ENCOUNTER — Encounter (INDEPENDENT_AMBULATORY_CARE_PROVIDER_SITE_OTHER): Payer: Self-pay

## 2019-05-27 DIAGNOSIS — G35 Multiple sclerosis: Secondary | ICD-10-CM | POA: Diagnosis not present

## 2019-06-24 DIAGNOSIS — G35 Multiple sclerosis: Secondary | ICD-10-CM | POA: Diagnosis not present

## 2019-07-01 ENCOUNTER — Telehealth: Payer: 59 | Admitting: Nurse Practitioner

## 2019-07-01 DIAGNOSIS — M549 Dorsalgia, unspecified: Secondary | ICD-10-CM

## 2019-07-01 MED ORDER — CYCLOBENZAPRINE HCL 10 MG PO TABS: 10.0000 mg | ORAL_TABLET | Freq: Three times a day (TID) | ORAL | 1 refills | Status: DC | PRN

## 2019-07-01 MED ORDER — NAPROXEN 500 MG PO TABS: 500.0000 mg | ORAL_TABLET | Freq: Two times a day (BID) | ORAL | 1 refills | Status: DC

## 2019-07-01 MED FILL — CYCLOBENZAPRINE HCL 10 MG T: 10 | 10 days supply | Qty: 30 | Fill #0

## 2019-07-01 MED FILL — NAPROXEN 500 MG TABLET: 500 | 30 days supply | Qty: 60 | Fill #0

## 2019-07-01 NOTE — Progress Notes (Signed)

## 2019-07-14 ENCOUNTER — Ambulatory Visit: Payer: 59 | Admitting: Certified Nurse Midwife

## 2019-07-14 ENCOUNTER — Encounter: Payer: Self-pay | Admitting: Certified Nurse Midwife

## 2019-07-14 ENCOUNTER — Other Ambulatory Visit: Payer: Self-pay

## 2019-07-14 VITALS — BP 122/84 | HR 101 | Ht 62.0 in | Wt 238.6 lb

## 2019-07-14 DIAGNOSIS — R635 Abnormal weight gain: Secondary | ICD-10-CM | POA: Diagnosis not present

## 2019-07-14 DIAGNOSIS — E282 Polycystic ovarian syndrome: Secondary | ICD-10-CM

## 2019-07-14 DIAGNOSIS — Z8639 Personal history of other endocrine, nutritional and metabolic disease: Secondary | ICD-10-CM | POA: Diagnosis not present

## 2019-07-14 MED ORDER — CLINDAMYCIN PHOSPHATE 1 % EX LOTN: TOPICAL_LOTION | Freq: Two times a day (BID) | CUTANEOUS | 0 refills | Status: DC

## 2019-07-14 MED ORDER — DROSPIRENONE-ETHINYL ESTRADIOL 3-0.02 MG PO TABS: 1.0000 | ORAL_TABLET | Freq: Every day | ORAL | 11 refills | Status: DC

## 2019-07-14 NOTE — Progress Notes (Signed)
Patient here to discuss weight management options.

## 2019-07-14 NOTE — Patient Instructions (Addendum)
Polycystic Ovarian Syndrome  Polycystic ovarian syndrome (PCOS) is a common hormonal disorder among women of reproductive age. In most women with PCOS, many small fluid-filled sacs (cysts) grow on the ovaries, and the cysts are not part of a normal menstrual cycle. PCOS can cause problems with your menstrual periods and make it difficult to get pregnant. It can also cause an increased risk of miscarriage with pregnancy. If it is not treated, PCOS can lead to serious health problems, such as diabetes and heart disease. What are the causes? The cause of PCOS is not known, but it may be the result of a combination of certain factors, such as:  Irregular menstrual cycle.  High levels of certain hormones (androgens).  Problems with the hormone that helps to control blood sugar (insulin resistance).  Certain genes. What increases the risk? This condition is more likely to develop in women who have a family history of PCOS. What are the signs or symptoms? Symptoms of PCOS may include:  Multiple ovarian cysts.  Infrequent periods or no periods.  Periods that are too frequent or too heavy.  Unpredictable periods.  Inability to get pregnant (infertility) because of not ovulating.  Increased growth of hair on the face, chest, stomach, back, thumbs, thighs, or toes.  Acne or oily skin. Acne may develop during adulthood, and it may not respond to treatment.  Pelvic pain.  Weight gain or obesity.  Patches of thickened and dark brown or black skin on the neck, arms, breasts, or thighs (acanthosis nigricans).  Excess hair growth on the face, chest, abdomen, or upper thighs (hirsutism). How is this diagnosed? This condition is diagnosed based on:  Your medical history.  A physical exam, including a pelvic exam. Your health care provider may look for areas of increased hair growth on your skin.  Tests, such as: ? Ultrasound. This may be used to examine the ovaries and the lining of the  uterus (endometrium) for cysts. ? Blood tests. These may be used to check levels of sugar (glucose), female hormone (testosterone), and female hormones (estrogen and progesterone) in your blood. How is this treated? There is no cure for PCOS, but treatment can help to manage symptoms and prevent more health problems from developing. Treatment varies depending on:  Your symptoms.  Whether you want to have a baby or whether you need birth control (contraception). Treatment may include nutrition and lifestyle changes along with:  Progesterone hormone to start a menstrual period.  Birth control pills to help you have regular menstrual periods.  Medicines to make you ovulate, if you want to get pregnant.  Medicine to reduce excessive hair growth.  Surgery, in severe cases. This may involve making small holes in one or both of your ovaries. This decreases the amount of testosterone that your body produces. Follow these instructions at home:  Take over-the-counter and prescription medicines only as told by your health care provider.  Follow a healthy meal plan. This can help you reduce the effects of PCOS. ? Eat a healthy diet that includes lean proteins, complex carbohydrates, fresh fruits and vegetables, low-fat dairy products, and healthy fats. Make sure to eat enough fiber.  If you are overweight, lose weight as told by your health care provider. ? Losing 10% of your body weight may improve symptoms. ? Your health care provider can determine how much weight loss is best for you and can help you lose weight safely.  Keep all follow-up visits as told by your health care provider.  This is important. Contact a health care provider if:  Your symptoms do not get better with medicine.  You develop new symptoms. This information is not intended to replace advice given to you by your health care provider. Make sure you discuss any questions you have with your health care provider. Document  Released: 03/15/2005 Document Revised: 11/01/2017 Document Reviewed: 05/06/2016 Elsevier Patient Education  2020 [place] for Polycystic Ovary Syndrome Polycystic ovary syndrome (PCOS) is a disorder of the chemicals (hormones) that regulate a woman's reproductive system, including monthly periods (menstruation). The condition causes important hormones to be out of balance. PCOS can:  Stop your periods or make them irregular.  Cause cysts to develop on your ovaries.  Make it difficult to get pregnant.  Stop your body from responding to the effects of insulin (insulin resistance). Insulin resistance can lead to obesity and diabetes. Changing what you eat can help you manage PCOS and improve your health. Following a balanced diet can help you lose weight and improve the way that your body uses insulin. What are tips for following this plan?  Follow a balanced diet for meals and snacks. Eat breakfast, lunch, dinner, and one or two snacks every day.  Include protein in each meal and snack.  Choose whole grains instead of products that are made with refined flour.  Eat a variety of foods.  Exercise regularly as told by your health care provider. Aim to do 30 or more minutes of exercise on most days of the week.  If you are overweight or obese: ? Pay attention to how many calories you eat. Cutting down on calories can help you lose weight. ? Work with your health care provider or a diet and nutrition specialist (dietitian) to figure out how many calories you need each day. What foods can I eat?  Fruits Include a variety of colors and types. All fruits are helpful for PCOS. Vegetables Include a variety of colors and types. All vegetables are helpful for PCOS. Grains Whole grains, such as whole wheat. Whole-grain breads, crackers, cereals, and pasta. Unsweetened oatmeal, bulgur, barley, quinoa, and brown rice. Tortillas made from corn or whole-wheat flour. Meats and other  proteins Low-fat (lean) proteins, such as fish, chicken, beans, eggs, and tofu. Dairy Low-fat dairy products, such as skim milk, cheese sticks, and yogurt. Beverages Low-fat or fat-free drinks, such as water, low-fat milk, sugar-free drinks, and small amounts of 100% fruit juice. Seasonings and condiments Ketchup. Mustard. Barbecue sauce. Relish. Low-fat or fat-free mayonnaise. Fats and oils Olive oil or canola oil. Walnuts and almonds. The items listed above may not be a complete list of recommended foods and beverages. Contact a dietitian for more options. What foods are not recommended? Foods that are high in calories or fat. Fried foods. Sweets. Products that are made from refined white flour, including white bread, pastries, white rice, and pasta. The items listed above may not be a complete list of foods and beverages to avoid. Contact a dietitian for more information. Summary  PCOS is a hormonal imbalance that affects a woman's reproductive system.  You can help to manage your PCOS by exercising regularly and eating a healthy, varied diet of vegetables, fruit, whole grains, low-fat (lean) protein, and low-fat dairy products.  Changing what you eat can improve the way that your body uses insulin, help your hormones reach normal levels, and help you lose weight. This information is not intended to replace advice given to you by your health care  provider. Make sure you discuss any questions you have with your health care provider. Document Released: 03/12/2016 Document Revised: 03/11/2019 Document Reviewed: 09/23/2017 Elsevier Patient Education  [place]; Ethinyl Estradiol tablets What is this medicine? DROSPIRENONE; ETHINYL ESTRADIOL (dro SPY re nown; ETH in il es tra DYE ole) is an oral contraceptive (birth control pill). This medicine combines two types of female hormones, an estrogen and a progestin. It is used to prevent ovulation and pregnancy. This  medicine may be used for other purposes; ask your health care provider or pharmacist if you have questions. COMMON BRAND NAME(S): Standley Dakins, Lo-Zumandimine, Steele Sizer 28-Day, Ocella, Syeda, Vestura, Lauretta Grill, Zumandimine What should I tell my health care provider before I take this medicine? They need to know if you have or ever had any of these conditions:  abnormal vaginal bleeding  adrenal gland disease  blood vessel disease or blood clots  breast, cervical, endometrial, ovarian, liver, or uterine cancer  diabetes  gallbladder disease  heart disease or recent heart attack  high blood pressure  high cholesterol  high potassium level  kidney disease  liver disease  migraine headaches  stroke  systemic lupus erythematosus (SLE)  tobacco smoker  an unusual or allergic reaction to estrogens, progestins, or other medicines, foods, dyes, or preservatives  pregnant or trying to get pregnant  breast-feeding How should I use this medicine? Take this medicine by mouth. To reduce nausea, this medicine may be taken with food. Follow the directions on the prescription label. Take this medicine at the same time each day and in the order directed on the package. Do not take your medicine more often than directed. A patient package insert for the product will be given with each prescription and refill. Read this sheet carefully each time. The sheet may change frequently. Talk to your pediatrician regarding the use of this medicine in children. Special care may be needed. This medicine has been used in female children who have started having menstrual periods. Overdosage: If you think you have taken too much of this medicine contact a poison control center or emergency room at once. NOTE: This medicine is only for you. Do not share this medicine with others. What if I miss a dose? If you miss a dose, refer to the patient information sheet you received with your  medicine for direction. If you miss more than one pill, this medicine may not be as effective and you may need to use another form of birth control. What may interact with this medicine? Do not take this medicine with any of the following medications:  aminoglutethimide  amprenavir, fosamprenavir  atazanavir; cobicistat  anastrozole  bosentan  exemestane  letrozole  metyrapone  testolactone This medicine may also interact with the following medications:  acetaminophen  antiviral medicines for HIV or AIDS  aprepitant  barbiturates  certain antibiotics like rifampin, rifabutin, rifapentine, and possibly penicillins or tetracyclines  certain diuretics like amiloride, spironolactone, triamterene  certain medicines for fungal infections like griseofulvin, ketoconazole, itraconazole  certain medications for high blood pressure or heart conditions like ACE-inhibitors, Angiotensin-II receptor blockers, eplerenone  certain medicines for seizures like carbamazepine, oxcarbazepine, phenobarbital, phenytoin  cholestyramine  cobicistat  corticosteroid like hydrocortisone and prednisolone  cyclosporine  dantrolene  felbamate  grapefruit juice  heparin  lamotrigine  medicines for diabetes, including pioglitazone  modafinil  NSAIDs  potassium supplements  pyrimethamine  raloxifene  St. John's wort  sulfasalazine  tamoxifen  topiramate  thyroid hormones  warfarin his list may not describe all possible interactions. Give your health care provider a list of all the medicines, herbs, non-prescription drugs, or dietary supplements you use. Also tell them if you smoke, drink alcohol, or use illegal drugs. Some items may interact with your medicine. This list may not describe all possible interactions. Give your health care provider a list of all the medicines, herbs, non-prescription drugs, or dietary supplements you use. Also tell them if you smoke, drink  alcohol, or use illegal drugs. Some items may interact with your medicine. What should I watch for while using this medicine? Visit your doctor or health care professional for regular checks on your progress. You will need a regular breast and pelvic exam and Pap smear while on this medicine. Use an additional method of contraception during the first cycle that you take these tablets. If you have any reason to think you are pregnant, stop taking this medicine right away and contact your doctor or health care professional. If you are taking this medicine for hormone related problems, it may take several cycles of use to see improvement in your condition. Smoking increases the risk of getting a blood clot or having a stroke while you are taking birth control pills, especially if you are more than 28 years old. You are strongly advised not to smoke. This medicine can make your body retain fluid, making your fingers, hands, or ankles swell. Your blood pressure can go up. Contact your doctor or health care professional if you feel you are retaining fluid. This medicine can make you more sensitive to the sun. Keep out of the sun. If you cannot avoid being in the sun, wear protective clothing and use sunscreen. Do not use sun lamps or tanning beds/booths. If you wear contact lenses and notice visual changes, or if the lenses begin to feel uncomfortable, consult your eye care specialist. In some women, tenderness, swelling, or minor bleeding of the gums may occur. Notify your dentist if this happens. Brushing and flossing your teeth regularly may help limit this. See your dentist regularly and inform your dentist of the medicines you are taking. If you are going to have elective surgery, you may need to stop taking this medicine before the surgery. Consult your health care professional for advice. This medicine does not protect you against HIV infection (AIDS) or any other sexually transmitted diseases. What  side effects may I notice from receiving this medicine? Side effects that you should report to your doctor or health care professional as soon as possible:  allergic reactions like skin rash, itching or hives, swelling of the face, lips, or tongue  breast tissue changes or discharge  changes in vision  chest pain  confusion, trouble speaking or understanding  dark urine  general ill feeling or flu-like symptoms  light-colored stools  nausea, vomiting  pain, swelling, warmth in the leg  right upper belly pain  severe headaches  shortness of breath  sudden numbness or weakness of the face, arm or leg  trouble walking, dizziness, loss of balance or coordination  unusual vaginal bleeding  yellowing of the eyes or skin Side effects that usually do not require medical attention (report to your doctor or health care professional if they continue or are bothersome):  acne  brown spots on the face  change in appetite  change in sexual desire  depressed mood or mood swings  fluid retention and swelling  stomach cramps or bloating  unusually weak or tired  weight gain This list may not describe all possible side effects. Call your doctor for medical advice about side effects. You may report side effects to FDA at 1-800-FDA-1088. Where should I keep my medicine? Keep out of the reach of children. Store at room temperature between 15 and 30 degrees C (59 and 86 degrees F). Throw away any unused medicine after the expiration date. NOTE: This sheet is a summary. It may not cover all possible information. If you have questions about this medicine, talk to your doctor, pharmacist, or health care provider.  2020 Elsevier/Gold Standard (2016-08-10 13:52:56)  Metformin tablets What is this medicine? METFORMIN (met FOR min) is used to treat type 2 diabetes. It helps to control blood sugar. Treatment is combined with diet and exercise. This medicine can be used alone or  with other medicines for diabetes. This medicine may be used for other purposes; ask your health care provider or pharmacist if you have questions. COMMON BRAND NAME(S): Glucophage What should I tell my health care provider before I take this medicine? They need to know if you have any of these conditions:  anemia  dehydration  heart disease  frequently drink alcohol-containing beverages  kidney disease  liver disease  polycystic ovary syndrome  serious infection or injury  vomiting  an unusual or allergic reaction to metformin, other medicines, foods, dyes, or preservatives  pregnant or trying to get pregnant  breast-feeding How should I use this medicine? Take this medicine by mouth with a glass of water. Follow the directions on the prescription label. Take this medicine with food. Take your medicine at regular intervals. Do not take your medicine more often than directed. Do not stop taking except on your doctor's advice. Talk to your pediatrician regarding the use of this medicine in children. While this drug may be prescribed for children as young as 56 years of age for selected conditions, precautions do apply. Overdosage: If you think you have taken too much of this medicine contact a poison control center or emergency room at once. NOTE: This medicine is only for you. Do not share this medicine with others. What if I miss a dose? If you miss a dose, take it as soon as you can. If it is almost time for your next dose, take only that dose. Do not take double or extra doses. What may interact with this medicine? Do not take this medicine with any of the following medications:  certain contrast medicines given before X-rays, CT scans, MRI, or other procedures  dofetilide This medicine may also interact with the following medications:  acetazolamide  alcohol  certain antivirals for HIV or hepatitis  certain medicines for blood pressure, heart disease, irregular  heart beat  cimetidine  dichlorphenamide  digoxin  diuretics  female hormones, like estrogens or progestins and birth control pills  glycopyrrolate  isoniazid  lamotrigine  memantine  methazolamide  metoclopramide  midodrine  niacin  phenothiazines like chlorpromazine, mesoridazine, prochlorperazine, thioridazine  phenytoin  ranolazine  steroid medicines like prednisone or cortisone  stimulant medicines for attention disorders, weight loss, or to stay awake  thyroid medicines  topiramate  trospium  vandetanib  zonisamide This list may not describe all possible interactions. Give your health care provider a list of all the medicines, herbs, non-prescription drugs, or dietary supplements you use. Also tell them if you smoke, drink alcohol, or use illegal drugs. Some items may interact with your medicine. What should I watch for while using this medicine? Visit your  doctor or health care professional for regular checks on your progress. A test called the HbA1C (A1C) will be monitored. This is a simple blood test. It measures your blood sugar control over the last 2 to 3 months. You will receive this test every 3 to 6 months. Learn how to check your blood sugar. Learn the symptoms of low and high blood sugar and how to manage them. Always carry a quick-source of sugar with you in case you have symptoms of low blood sugar. Examples include hard sugar candy or glucose tablets. Make sure others know that you can choke if you eat or drink when you develop serious symptoms of low blood sugar, such as seizures or unconsciousness. They must get medical help at once. Tell your doctor or health care professional if you have high blood sugar. You might need to change the dose of your medicine. If you are sick or exercising more than usual, you might need to change the dose of your medicine. Do not skip meals. Ask your doctor or health care professional if you should avoid  alcohol. Many nonprescription cough and cold products contain sugar or alcohol. These can affect blood sugar. This medicine may cause ovulation in premenopausal women who do not have regular monthly periods. This may increase your chances of becoming pregnant. You should not take this medicine if you become pregnant or think you may be pregnant. Talk with your doctor or health care professional about your birth control options while taking this medicine. Contact your doctor or health care professional right away if you think you are pregnant. If you are going to need surgery, a MRI, CT scan, or other procedure, tell your doctor that you are taking this medicine. You may need to stop taking this medicine before the procedure. Wear a medical ID bracelet or chain, and carry a card that describes your disease and details of your medicine and dosage times. This medicine may cause a decrease in folic acid and vitamin B12. You should make sure that you get enough vitamins while you are taking this medicine. Discuss the foods you eat and the vitamins you take with your health care professional. What side effects may I notice from receiving this medicine? Side effects that you should report to your doctor or health care professional as soon as possible:  allergic reactions like skin rash, itching or hives, swelling of the face, lips, or tongue  breathing problems  feeling faint or lightheaded, falls  muscle aches or pains  signs and symptoms of low blood sugar such as feeling anxious, confusion, dizziness, increased hunger, unusually weak or tired, sweating, shakiness, cold, irritable, headache, blurred vision, fast heartbeat, loss of consciousness  slow or irregular heartbeat  unusual stomach pain or discomfort  unusually tired or weak Side effects that usually do not require medical attention (report to your doctor or health care professional if they continue or are  bothersome):  diarrhea  headache  heartburn  metallic taste in mouth  nausea  stomach gas, upset This list may not describe all possible side effects. Call your doctor for medical advice about side effects. You may report side effects to FDA at 1-800-FDA-1088. Where should I keep my medicine? Keep out of the reach of children. Store at room temperature between 15 and 30 degrees C (59 and 86 degrees F). Protect from moisture and light. Throw away any unused medicine after the expiration date. NOTE: This sheet is a summary. It may not cover all possible  information. If you have questions about this medicine, talk to your doctor, pharmacist, or health care provider.  2020 Elsevier/Gold Standard (2017-12-26 19:15:19)  Bupropion extended-release tablets (Depression/Mood Disorders) What is this medicine? BUPROPION (byoo PROE pee on) is used to treat depression. This medicine may be used for other purposes; ask your health care provider or pharmacist if you have questions. COMMON BRAND NAME(S): Aplenzin, Budeprion XL, Forfivo XL, Wellbutrin XL What should I tell my health care provider before I take this medicine? They need to know if you have any of these conditions:  an eating disorder, such as anorexia or bulimia  bipolar disorder or psychosis  diabetes or high blood sugar, treated with medication  glaucoma  head injury or brain tumor  heart disease, previous heart attack, or irregular heart beat  high blood pressure  kidney or liver disease  seizures (convulsions)  suicidal thoughts or a previous suicide attempt  Tourette's syndrome  weight loss  an unusual or allergic reaction to bupropion, other medicines, foods, dyes, or preservatives  breast-feeding  pregnant or trying to become pregnant How should I use this medicine? Take this medicine by mouth with a glass of water. Follow the directions on the prescription label. You can take it with or without food. If  it upsets your stomach, take it with food. Do not crush, chew, or cut these tablets. This medicine is taken once daily at the same time each day. Do not take your medicine more often than directed. Do not stop taking this medicine suddenly except upon the advice of your doctor. Stopping this medicine too quickly may cause serious side effects or your condition may worsen. A special MedGuide will be given to you by the pharmacist with each prescription and refill. Be sure to read this information carefully each time. Talk to your pediatrician regarding the use of this medicine in children. Special care may be needed. Overdosage: If you think you have taken too much of this medicine contact a poison control center or emergency room at once. NOTE: This medicine is only for you. Do not share this medicine with others. What if I miss a dose? If you miss a dose, skip the missed dose and take your next tablet at the regular time. Do not take double or extra doses. What may interact with this medicine? Do not take this medicine with any of the following medications:  linezolid  MAOIs like Azilect, Carbex, Eldepryl, Marplan, Nardil, and Parnate  methylene blue (injected into a vein)  other medicines that contain bupropion like Zyban This medicine may also interact with the following medications:  alcohol  certain medicines for anxiety or sleep  certain medicines for blood pressure like metoprolol, propranolol  certain medicines for depression or psychotic disturbances  certain medicines for HIV or AIDS like efavirenz, lopinavir, nelfinavir, ritonavir  certain medicines for irregular heart beat like propafenone, flecainide  certain medicines for Parkinson's disease like amantadine, levodopa  certain medicines for seizures like carbamazepine, phenytoin,  phenobarbital  cimetidine  clopidogrel  cyclophosphamide  digoxin  furazolidone  isoniazid  nicotine  orphenadrine  procarbazine  steroid medicines like prednisone or cortisone  stimulant medicines for attention disorders, weight loss, or to stay awake  tamoxifen  theophylline  thiotepa  ticlopidine  tramadol  warfarin This list may not describe all possible interactions. Give your health care provider a list of all the medicines, herbs, non-prescription drugs, or dietary supplements you use. Also tell them if you smoke, drink alcohol, or use  illegal drugs. Some items may interact with your medicine. What should I watch for while using this medicine? Tell your doctor if your symptoms do not get better or if they get worse. Visit your doctor or healthcare provider for regular checks on your progress. Because it may take several weeks to see the full effects of this medicine, it is important to continue your treatment as prescribed by your doctor. This medicine may cause serious skin reactions. They can happen weeks to months after starting the medicine. Contact your healthcare provider right away if you notice fevers or flu-like symptoms with a rash. The rash may be red or purple and then turn into blisters or peeling of the skin. Or, you might notice a red rash with swelling of the face, lips or lymph nodes in your neck or under your arms. Patients and their families should watch out for new or worsening thoughts of suicide or depression. Also watch out for sudden changes in feelings such as feeling anxious, agitated, panicky, irritable, hostile, aggressive, impulsive, severely restless, overly excited and hyperactive, or not being able to sleep. If this happens, especially at the beginning of treatment or after a change in dose, call your healthcare provider. Avoid alcoholic drinks while taking this medicine. Drinking large amounts of alcoholic beverages, using sleeping or  anxiety medicines, or quickly stopping the use of these agents while taking this medicine may increase your risk for a seizure. Do not drive or use heavy machinery until you know how this medicine affects you. This medicine can impair your ability to perform these tasks. Do not take this medicine close to bedtime. It may prevent you from sleeping. Your mouth may get dry. Chewing sugarless gum or sucking hard candy, and drinking plenty of water may help. Contact your doctor if the problem does not go away or is severe. The tablet shell for some brands of this medicine does not dissolve. This is normal. The tablet shell may appear whole in the stool. This is not a cause for concern. What side effects may I notice from receiving this medicine? Side effects that you should report to your doctor or health care professional as soon as possible:  allergic reactions like skin rash, itching or hives, swelling of the face, lips, or tongue  breathing problems  changes in vision  confusion  elevated mood, decreased need for sleep, racing thoughts, impulsive behavior  fast or irregular heartbeat  hallucinations, loss of contact with reality  increased blood pressure  rash, fever, and swollen lymph nodes  redness, blistering, peeling or loosening of the skin, including inside the mouth  seizures  suicidal thoughts or other mood changes  unusually weak or tired  vomiting Side effects that usually do not require medical attention (report to your doctor or health care professional if they continue or are bothersome):  constipation  headache  loss of appetite  nausea  tremors  weight loss This list may not describe all possible side effects. Call your doctor for medical advice about side effects. You may report side effects to FDA at 1-800-FDA-1088. Where should I keep my medicine? Keep out of the reach of children. Store at room temperature between 15 and 30 degrees C (59 and 86  degrees F). Throw away any unused medicine after the expiration date. NOTE: This sheet is a summary. It may not cover all possible information. If you have questions about this medicine, talk to your doctor, pharmacist, or health care provider.  2020 Elsevier/Gold Standard (  2019-02-12 13:45:31)  Topiramate tablets What is this medicine? TOPIRAMATE (toe PYRE a mate) is used to treat seizures in adults or children with epilepsy. It is also used for the prevention of migraine headaches. This medicine may be used for other purposes; ask your health care provider or pharmacist if you have questions. COMMON BRAND NAME(S): Topamax, Topiragen What should I tell my health care provider before I take this medicine? They need to know if you have any of these conditions:  bleeding disorders  cirrhosis of the liver or liver disease  diarrhea  glaucoma  kidney stones or kidney disease  low blood counts, like low white cell, platelet, or red cell counts  lung disease like asthma, obstructive pulmonary disease, emphysema  metabolic acidosis  on a ketogenic diet  schedule for surgery or a procedure  suicidal thoughts, plans, or attempt; a previous suicide attempt by you or a family member  an unusual or allergic reaction to topiramate, other medicines, foods, dyes, or preservatives  pregnant or trying to get pregnant  breast-feeding How should I use this medicine? Take this medicine by mouth with a glass of water. Follow the directions on the prescription label. Do not crush or chew. You may take this medicine with meals. Take your medicine at regular intervals. Do not take it more often than directed. Talk to your pediatrician regarding the use of this medicine in children. Special care may be needed. While this drug may be prescribed for children as young as 35 years of age for selected conditions, precautions do apply. Overdosage: If you think you have taken too much of this medicine  contact a poison control center or emergency room at once. NOTE: This medicine is only for you. Do not share this medicine with others. What if I miss a dose? If you miss a dose, take it as soon as you can. If your next dose is to be taken in less than 6 hours, then do not take the missed dose. Take the next dose at your regular time. Do not take double or extra doses. What may interact with this medicine? Do not take this medicine with any of the following medications:  probenecid This medicine may also interact with the following medications:  acetazolamide  alcohol  amitriptyline  aspirin and aspirin-like medicines  birth control pills  certain medicines for depression  certain medicines for seizures  certain medicines that treat or prevent blood clots like warfarin, enoxaparin, dalteparin, apixaban, dabigatran, and rivaroxaban  digoxin  hydrochlorothiazide  lithium  medicines for pain, sleep, or muscle relaxation  metformin  methazolamide  NSAIDS, medicines for pain and inflammation, like ibuprofen or naproxen  pioglitazone  risperidone This list may not describe all possible interactions. Give your health care provider a list of all the medicines, herbs, non-prescription drugs, or dietary supplements you use. Also tell them if you smoke, drink alcohol, or use illegal drugs. Some items may interact with your medicine. What should I watch for while using this medicine? Visit your doctor or health care professional for regular checks on your progress. Do not stop taking this medicine suddenly. This increases the risk of seizures if you are using this medicine to control epilepsy. Wear a medical identification bracelet or chain to say you have epilepsy or seizures, and carry a card that lists all your medicines. This medicine can decrease sweating and increase your body temperature. Watch for signs of deceased sweating or fever, especially in children. Avoid extreme  heat, hot baths,  and saunas. Be careful about exercising, especially in hot weather. Contact your health care provider right away if you notice a fever or decrease in sweating. You should drink plenty of fluids while taking this medicine. If you have had kidney stones in the past, this will help to reduce your chances of forming kidney stones. If you have stomach pain, with nausea or vomiting and yellowing of your eyes or skin, call your doctor immediately. You may get drowsy, dizzy, or have blurred vision. Do not drive, use machinery, or do anything that needs mental alertness until you know how this medicine affects you. To reduce dizziness, do not sit or stand up quickly, especially if you are an older patient. Alcohol can increase drowsiness and dizziness. Avoid alcoholic drinks. If you notice blurred vision, eye pain, or other eye problems, seek medical attention at once for an eye exam. The use of this medicine may increase the chance of suicidal thoughts or actions. Pay special attention to how you are responding while on this medicine. Any worsening of mood, or thoughts of suicide or dying should be reported to your health care professional right away. This medicine may increase the chance of developing metabolic acidosis. If left untreated, this can cause kidney stones, bone disease, or slowed growth in children. Symptoms include breathing fast, fatigue, loss of appetite, irregular heartbeat, or loss of consciousness. Call your doctor immediately if you experience any of these side effects. Also, tell your doctor about any surgery you plan on having while taking this medicine since this may increase your risk for metabolic acidosis. Birth control pills may not work properly while you are taking this medicine. Talk to your doctor about using an extra method of birth control. Women who become pregnant while using this medicine may enroll in the [place] Pregnancy Registry by  calling [id number]. This registry collects information about the safety of antiepileptic drug use during pregnancy. What side effects may I notice from receiving this medicine? Side effects that you should report to your doctor or health care professional as soon as possible:  allergic reactions like skin rash, itching or hives, swelling of the face, lips, or tongue  decreased sweating and/or rise in body temperature  depression  difficulty breathing, fast or irregular breathing patterns  difficulty speaking  difficulty walking or controlling muscle movements  hearing impairment  redness, blistering, peeling or loosening of the skin, including inside the mouth  tingling, pain or numbness in the hands or feet  unusual bleeding or bruising  unusually weak or tired  worsening of mood, thoughts or actions of suicide or dying Side effects that usually do not require medical attention (report to your doctor or health care professional if they continue or are bothersome):  altered taste  back pain, joint or muscle aches and pains  diarrhea, or constipation  headache  loss of appetite  nausea  stomach upset, indigestion  tremors This list may not describe all possible side effects. Call your doctor for medical advice about side effects. You may report side effects to FDA at 1-800-FDA-1088. Where should I keep my medicine? Keep out of the reach of children. Store at room temperature between 15 and 30 degrees C (59 and 86 degrees F) in a tightly closed container. Protect from moisture. Throw away any unused medicine after the expiration date. NOTE: This sheet is a summary. It may not cover all possible information. If you have questions about this medicine, talk to your doctor, pharmacist,  or health care provider.  2020 Elsevier/Gold Standard (2013-11-23 23:17:57)  Spironolactone tablets What is this medicine? SPIRONOLACTONE (speer on [place] LAK tone) is a diuretic. It  helps you make more urine and to lose excess water from your body. This medicine is used to treat high blood pressure, and edema or swelling from heart, kidney, or liver disease. It is also used to treat patients who make too much aldosterone or have low potassium. This medicine may be used for other purposes; ask your health care provider or pharmacist if you have questions. COMMON BRAND NAME(S): Aldactone What should I tell my health care provider before I take this medicine? They need to know if you have any of these conditions:  high blood level of potassium  kidney disease or trouble making urine  liver disease  an unusual or allergic reaction to spironolactone, other medicines, foods, dyes, or preservatives  pregnant or trying to get pregnant  breast-feeding How should I use this medicine? Take this medicine by mouth with a drink of water. Follow the directions on your prescription label. You can take it with or without food. If it upsets your stomach, take it with food. Do not take your medicine more often than directed. Remember that you will need to pass more urine after taking this medicine. Do not take your doses at a time of day that will cause you problems. Do not take at bedtime. Talk to your pediatrician regarding the use of this medicine in children. While this drug may be prescribed for selected conditions, precautions do apply. Overdosage: If you think you have taken too much of this medicine contact a poison control center or emergency room at once. NOTE: This medicine is only for you. Do not share this medicine with others. What if I miss a dose? If you miss a dose, take it as soon as you can. If it is almost time for your next dose, take only that dose. Do not take double or extra doses. What may interact with this medicine? Do not take this medicine with any of the following medications:  cidofovir  eplerenone  tranylcypromine This medicine may also interact with  the following medications:  aspirin  certain medicines for blood pressure or heart disease like benazepril, lisinopril, losartan, valsartan  certain medicines that treat or prevent blood clots like heparin and enoxaparin  cholestyramine  cyclosporine  digoxin  lithium  medicines that relax muscles for surgery  NSAIDs, medicines for pain and inflammation, like ibuprofen or naproxen  other diuretics  potassium supplements  steroid medicines like prednisone or cortisone  trimethoprim This list may not describe all possible interactions. Give your health care provider a list of all the medicines, herbs, non-prescription drugs, or dietary supplements you use. Also tell them if you smoke, drink alcohol, or use illegal drugs. Some items may interact with your medicine. What should I watch for while using this medicine? Visit your doctor or health care professional for regular checks on your progress. Check your blood pressure as directed. Ask your doctor what your blood pressure should be, and when you should contact them. You may need to be on a special diet while taking this medicine. Ask your doctor. Also, ask how many glasses of fluid you need to drink a day. You must not get dehydrated. This medicine may make you feel confused, dizzy or lightheaded. Drinking alcohol and taking some medicines can make this worse. Do not drive, use machinery, or do anything that needs mental alertness  until you know how this medicine affects you. Do not sit or stand up quickly. What side effects may I notice from receiving this medicine? Side effects that you should report to your doctor or health care professional as soon as possible:  allergic reactions such as skin rash or itching, hives, swelling of the lips, mouth, tongue, or throat  black or tarry stools  fast, irregular heartbeat  fever  muscle pain, cramps  numbness, tingling in hands or feet  trouble breathing  trouble passing  urine  unusual bleeding  unusually weak or tired Side effects that usually do not require medical attention (report to your doctor or health care professional if they continue or are bothersome):  change in voice or hair growth  confusion  dizzy, drowsy  dry mouth, increased thirst  enlarged or tender breasts  headache  irregular menstrual periods  sexual difficulty, unable to have an erection  stomach upset This list may not describe all possible side effects. Call your doctor for medical advice about side effects. You may report side effects to FDA at 1-800-FDA-1088. Where should I keep my medicine? Keep out of the reach of children. Store below 25 degrees C (77 degrees F). Throw away any unused medicine after the expiration date. NOTE: This sheet is a summary. It may not cover all possible information. If you have questions about this medicine, talk to your doctor, pharmacist, or health care provider.  2020 Elsevier/Gold Standard (2017-04-05 09:42:28)

## 2019-07-14 NOTE — Progress Notes (Signed)
GYN ENCOUNTER NOTE  Subjective:       Tracey Lindsey is a 28 y.o. [id number] female is here for gynecologic evaluation of the following issues:  1. Weight gain 2. Acne 3. Increased facial hair  History of MS, insulin resistance, vitamin d deficiency and PCOS.   Desires medical weight loss with phentermine. Sister is taking the phentermine with good results.   Denies difficulty breathing or respiratory distress, chest pain, abdominal pain, excessive vaginal bleeding, dysuria, and leg pain or swelling.    Gynecologic History  Patient's last menstrual period was 07/03/2019 (exact date). Period Cycle (Days): 28 Period Duration (Days): 5 Period Pattern: Regular Menstrual Flow: Moderate Menstrual Control: Tampon, Thin pad Dysmenorrhea: (!) Mild Dysmenorrhea Symptoms: Cramping  Contraception: vasectomy  Last Pap: 11/2016. Results were: Negative/Negative  Obstetric History  OB History  Gravida Para Term Preterm AB Living  1 0 0 0 1 0  SAB TAB Ectopic Multiple Live Births  0 1 0 0 0    # Outcome Date GA Lbr Len/2nd Weight Sex Delivery Anes PTL Lv  1 TAB 2017            Past Medical History:  Diagnosis Date  . Anxiety   . Chlamydia   . Depression   . History of abnormal cervical Papanicolaou smear   . MS (multiple sclerosis) [facility]) April 2016  . PCOS (polycystic ovarian syndrome)   . Vitamin D deficiency     No past surgical history on file.  Current Outpatient Medications on File Prior to Visit  Medication Sig Dispense Refill  . cholecalciferol (VITAMIN D) 1000 units tablet Take 1,000 Units by mouth daily.    Marland Kitchen LORazepam (ATIVAN) 0.5 MG tablet Take 1 tablet (0.5 mg total) by mouth 2 (two) times daily as needed for anxiety. 15 tablet 1  . metFORMIN (GLUCOPHAGE XR) 500 MG 24 hr tablet Take 1 tablet (500 mg total) by mouth daily with breakfast. 90 tablet 1  . natalizumab (TYSABRI) 300 MG/15ML injection Inject 15 mLs into the vein every 30 (thirty) days.     . vitamin C  (ASCORBIC ACID) 500 MG tablet Take 500 mg by mouth daily.     Current Facility-Administered Medications on File Prior to Visit  Medication Dose Route Frequency Provider Last Rate Last Dose  . gadopentetate dimeglumine (MAGNEVIST) injection 18 mL  18 mL Intravenous Once PRN Sater, Nanine Means, MD        No Known Allergies  Social History   Socioeconomic History  . Marital status: Single    Spouse name: Not on file  . Number of children: Not on file  . Years of education: 23  . Highest education level: Master's degree (e.g., MA, MS, MEng, MEd, MSW, MBA)  Occupational History  . Occupation: Holiday representative  Social Needs  . Financial resource strain: Not hard at all  . Food insecurity    Worry: Never true    Inability: Never true  . Transportation needs    Medical: No    Non-medical: No  Tobacco Use  . Smoking status: Never Smoker  . Smokeless tobacco: Never Used  Substance and Sexual Activity  . Alcohol use: Yes    Comment: occasionally  . Drug use: Never  . Sexual activity: Yes    Partners: Male    Birth control/protection: Other-see comments    Comment: partner has vasectomy  Lifestyle  . Physical activity    Days per week: 0 days    Minutes per session:  0 min  . Stress: Not at all  Relationships  . Social connections    Talks on phone: More than three times a week    Gets together: Once a week    Attends religious service: 1 to 4 times per year    Active member of club or organization: No    Attends meetings of clubs or organizations: Never    Relationship status: Never married  . Intimate partner violence    Fear of current or ex partner: No    Emotionally abused: No    Physically abused: No    Forced sexual activity: No  Other Topics Concern  . Not on file  Social History Narrative   Lives with mother    Drinks soda daily (8-10 ounces)    Family History  Problem Relation Age of Onset  . Diabetes Father   . Hypertension Father   . Stroke Father    . Cancer Mother        lung  . Hyperlipidemia Mother   . Lung disease Mother   . Eczema Brother   . Breast cancer Neg Hx   . Ovarian cancer Neg Hx   . Colon cancer Neg Hx     The following portions of the patient's history were reviewed and updated as appropriate: allergies, current medications, past family history, past medical history, past social history, past surgical history and problem list.  Review of Systems  ROS negative except as noted above. Information obtained from patient.   Objective:   BP 122/84   Pulse (!) 101   Ht 5\' 2"  (1.575 m)   Wt 238 lb 9.6 oz (108.2 kg)   LMP 07/03/2019 (Exact Date)   BMI 43.64 kg/m    CONSTITUTIONAL: Well-developed, well-nourished female in no acute distress.   PHYSICAL EXAM: Not indicated.    Assessment:   1. PCOS (polycystic ovarian syndrome)  - Testosterone, Free, Total, SHBG - Thyroid Panel With TSH - FSH/LH - Hemoglobin A1c  2. History of insulin resistance   3. Weight gain    Plan:   Discussed options for management of weight and PCOS   Labs: see orders  Rx: Yaz and Clindamycin, see orders  Reviewed red flag symptoms and when to call  RTC x 3 months for follow up or sooner if needed   Gunnar Bulla, CNM [facility], [facility] 07/14/19 6:21 PM   A total of 20 minutes were spent face-to-face with the patient during the encounter with greater than 50% dealing with counseling and coordination of care.

## 2019-07-16 LAB — FSH/LH
FSH: 17.9 m[IU]/mL
LH: 120 m[IU]/mL

## 2019-07-16 LAB — THYROID PANEL WITH TSH
Free Thyroxine Index: 1.6 (ref 1.2–4.9)
T3 Uptake Ratio: 24 % (ref 24–39)
T4, Total: 6.5 ug/dL (ref 4.5–12.0)
TSH: 1.63 u[IU]/mL (ref 0.450–4.500)

## 2019-07-16 LAB — TESTOSTERONE, FREE, TOTAL, SHBG
Sex Hormone Binding: 30.4 nmol/L (ref 24.6–122.0)
Testosterone, Free: 2.5 pg/mL (ref 0.0–4.2)
Testosterone: 37 ng/dL (ref 8–48)

## 2019-07-16 LAB — HEMOGLOBIN A1C
Est. average glucose Bld gHb Est-mCnc: 114 mg/dL
Hgb A1c MFr Bld: 5.6 % (ref 4.8–5.6)

## 2019-07-21 ENCOUNTER — Encounter: Payer: Self-pay | Admitting: Certified Nurse Midwife

## 2019-07-28 DIAGNOSIS — G35 Multiple sclerosis: Secondary | ICD-10-CM | POA: Diagnosis not present

## 2019-07-29 ENCOUNTER — Telehealth: Payer: 59 | Admitting: Nurse Practitioner

## 2019-07-29 DIAGNOSIS — N3000 Acute cystitis without hematuria: Secondary | ICD-10-CM | POA: Diagnosis not present

## 2019-07-29 MED ORDER — NITROFURANTOIN MONOHYD MACRO 100 MG PO CAPS: 100.0000 mg | ORAL_CAPSULE | Freq: Two times a day (BID) | ORAL | 0 refills | Status: DC

## 2019-07-29 MED FILL — NITROFURANTOIN MONO-MCR 100: 100 | 7 days supply | Qty: 14 | Fill #0

## 2019-07-29 NOTE — Progress Notes (Signed)

## 2019-08-05 ENCOUNTER — Encounter: Payer: Self-pay | Admitting: Neurology

## 2019-08-05 ENCOUNTER — Other Ambulatory Visit: Payer: Self-pay

## 2019-08-05 ENCOUNTER — Telehealth: Payer: Self-pay | Admitting: *Deleted

## 2019-08-05 ENCOUNTER — Ambulatory Visit: Payer: 59 | Admitting: Neurology

## 2019-08-05 VITALS — BP 118/78 | HR 112 | Temp 96.4°F | Ht 62.0 in | Wt 233.0 lb

## 2019-08-05 DIAGNOSIS — Z79899 Other long term (current) drug therapy: Secondary | ICD-10-CM

## 2019-08-05 DIAGNOSIS — R351 Nocturia: Secondary | ICD-10-CM

## 2019-08-05 DIAGNOSIS — G35 Multiple sclerosis: Secondary | ICD-10-CM | POA: Diagnosis not present

## 2019-08-05 DIAGNOSIS — E559 Vitamin D deficiency, unspecified: Secondary | ICD-10-CM

## 2019-08-05 MED ORDER — LORAZEPAM 0.5 MG PO TABS: 0.5000 mg | ORAL_TABLET | Freq: Two times a day (BID) | ORAL | 1 refills | Status: DC | PRN

## 2019-08-05 NOTE — Telephone Encounter (Signed)
Placed [facility] lab in [facility] lock box for routine lab pick up. Results pending. 

## 2019-08-05 NOTE — Progress Notes (Signed)
[facility]  PATIENT: Tracey Lindsey DOB: 04/05/1991  REFERRING DOCTOR OR PCP:  Luan PullingMelinda Peterson Jaci LazierLada ([place]) SOURCE: Patient and ER records and PACS images  _________________________________   HISTORICAL  CHIEF COMPLAINT:  Chief Complaint  Patient presents with  . Follow-up    RM 13, alone. Last seen 02/02/19  . Multiple Sclerosis    On Tysabri. Last JCV ab 02/02/19 negative, index: 0.13. Last infusion: Week of 07/27/19. Scheduled 08/25/19 for next. NEEDS JCV LAB TODAY     HISTORY OF PRESENT ILLNESS:  Tracey Lindsey is a 28 y.o. woman recently diagnosed with MS.     Update 08/05/2019: She is on Tysabri and denies any exacerbation.   Tolerates it well.   She is JCV Ab negative (last checked 02/2019).  MRI of the brain 2019 showed no new lesions.  Her gait is doing well.   She occasional feels slightly off balanced if standing still but no problems climbing stairs.     No weakness or numbness.   Bladder function is ok though she has nocturia x 2..   Vision is fine.  She notes some fatigue.   She is sleeping well (falls back asleep easily most nights after nocturia).    She is on OCP for PCOS.   She feels a little more anxious but not depressed.    She feels she is a bit more reserved due to mild anxiety but ist does not prevent her from doing what she needs to do.   Cognition is fine.  Occasionally, she has some short term memory issue but does ok with a hint.    She started Vit D 50000 units weekly as she prefers that to OTC daily.    Update 02/02/2019: She has done well on Tysabri.   MRI 08/2018 showed no new lesions.   She tolerates it well and has no exacerbation.  Last JCV Ab is 0.23 (negative inhib assay).   Gait is good.   She can climb stairs and ladders and no recent falls.   Bladder function is good.   She has nocturia 1-2 times but no incontinence.    Vision is doing well.    She likely had ON in 2014.     She has fatigue.   She sleeps well most nights.   She  is planning on joining a weight management programs.  She has mild depression and some anxiety but only neds lorazepam once a week.   No recent panic attacks.  Cognition is good.   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       Update 07/31/2018: She is on Tysabri and she tolerates it well.    She denies any new MS symptoms and no exacerbation.    She is walking well and she can climb stairs and a ladder.     She denies weakness or spasticity    Her fingers feel tigh if she does a lot of typing.    Bladder function is ok with mild frequency.     Vision is doing well.  She has some fatigue.   She sleeps well most nights.    She denies depression but notes some anxiety.   She takes lorazepam prn.   No recent panic attacks.   She deneis any cognitive issues.    Her last MRis were from 10/2016 and showed no new lesions.  She was diagnosed with PCOS and placed on hormone therapy.  Update 01/30/2018: Her MS has done very well  on Tysabri. She has not had any exacerbations. She tolerates the medication well. Her JCV antibody was last tested 07/24/2017. It was negative at 0.11 at that time..       She has urinary frequency but no incontinence.    Vision is fine.     She has some fatigue but is able to do everthing that she wants to do.    She denies depression but notes generalized anxiety.   There are no panic attacks.   She takes lorazepam just 2 x a month on average.    She has some insomnia, also has some nocturia, twice most nights. She denies any difficulty with her cognition.  Her headaches are doing better.   She took imipramine.   HA improved but not sure if due to imipramine..   Imipramine helped her sleep and nocturia. She stopped after a few weeks.      From 07/24/2017:  MS:  She is on Tysabri and tolerates it well.  She is JCV Ab negative (0.08  on 01/24/2017)   She denies any new exacerbation.   Cervical spine and brain November 2017 was personally reviewed. There were no new lesions. The previously enhancing lesions  were smaller and no longer enhanced.  Headaches:   She is still getting frequent headaches. (>25/30 a month).   Pain is bilateral and is usually mild to moderate.   It builds up daily at work.  HA is sometimes present when she wakes up though.  She gets a pressure sensation.  HA is usually milder the next morning.   It only seems to occur when she tries to go to sleep.  No nausea or vomiting.   Moving does not alter the pain much.   She denies occipital pain.    She notes pain in the back of the head/upper neck as well.   She does not take anything for the headache.  She is not sure if imipramine helped any when she tried in the past.  Gait/strength/sensation:   Her gait is back to baseline. She denies any weakness or spasticity in the arms or legs. There is no numbness.   Bladder/bowel:  She has urinary frequency only at night.    She has 3-4 times nocturia many nights.   No incontinence.  No recent UTI's.   No hesitancy.   Bowel is fine  Vision/ears:  Diplopia resolved. She denies any history of optic neuritis. She does not have any visual acuity issues or problems with color vision..  Fatigue/sleep:   She reports mild fatigue. This is worse with heat. She is working full-time.      She wakes up some at night but usually falls back asleep.   he is sleeping worse with frequent awakenings for nocturia.     Imipramine helped but caused a hangover.     Mood/Cognition:   She notes some anxiety but no depression.   She denies any significant cognitive change.   MS History:   In retrospect, she had an episode of vertigo in 2014 with mild vision changes that improved.    In March 2016, she had vertigo that improved after a couple weeks.  In late April, she had  numbness in the left foot. When she woke up the next morning, she noted that the numbness was higher up the left arm and the next day it also involved the left hand and she went to the emergency room. In the emergency room, she had an MRI  of the brain  that showed white matter foci were some for multiple sclerosis.   MRI of the brain with and without contrast 03/29/15 showed several posterior fossa lesions with one in the right middle cerebellar peduncle and several in the pons.   The middle cerebellar peduncle focus and 2 of the pontine foci enhanced. There are also several periventricular and deep white matter foci in the cerebral hemispheres.   MRI cervical spine shows one enhancing focus.  Repeat imaging of the brain and cervical spine November 2017 did not show any new lesions. The previously enhancing lesions were smaller and did not enhance.Marland Kitchen       REVIEW OF SYSTEMS: Constitutional: No fevers, chills, sweats, or change in appetite.   She notes some fatigue. Eyes: No visual changes, double vision, eye pain Ear, nose and throat: No hearing loss, ear pain, nasal congestion, sore throat Cardiovascular: No chest pain, palpitations Respiratory: No shortness of breath at rest or with exertion.   No wheezes GastrointestinaI: No nausea, vomiting, diarrhea, abdominal pain, fecal incontinence Genitourinary: Notes frequency, nocturia (x2).   She has PCOS Musculoskeletal: No neck pain, back pain Integumentary: No rash, pruritus, skin lesions Neurological: as above Psychiatric: as above  Endocrine: No palpitations, diaphoresis, change in appetite, change in weigh or increased thirst Hematologic/Lymphatic: No anemia, purpura, petechiae. Allergic/Immunologic: No itchy/runny eyes, nasal congestion, recent allergic reactions, rashes  ALLERGIES: No Known Allergies  HOME MEDICATIONS:  Current Outpatient Medications:  .  cholecalciferol (VITAMIN D) 1000 units tablet, Take 1,000 Units by mouth daily., Disp: , Rfl:  .  clindamycin (CLEOCIN T) 1 % lotion, Apply topically 2 (two) times daily., Disp: 60 mL, Rfl: 0 .  drospirenone-ethinyl estradiol (YAZ) 3-0.02 MG tablet, Take 1 tablet by mouth daily., Disp: 1 Package, Rfl: 11 .  LORazepam (ATIVAN) 0.5  MG tablet, Take 1 tablet (0.5 mg total) by mouth 2 (two) times daily as needed for anxiety., Disp: 15 tablet, Rfl: 1 .  metFORMIN (GLUCOPHAGE XR) 500 MG 24 hr tablet, Take 1 tablet (500 mg total) by mouth daily with breakfast., Disp: 90 tablet, Rfl: 1 .  natalizumab (TYSABRI) 300 MG/15ML injection, Inject 15 mLs into the vein every 30 (thirty) days. , Disp: , Rfl:  .  vitamin C (ASCORBIC ACID) 500 MG tablet, Take 500 mg by mouth daily., Disp: , Rfl:  No current facility-administered medications for this visit.   Facility-Administered Medications Ordered in Other Visits:  .  gadopentetate dimeglumine (MAGNEVIST) injection 18 mL, 18 mL, Intravenous, Once PRN, Doneisha Ivey, Nanine Means, MD  PAST MEDICAL HISTORY: Past Medical History:  Diagnosis Date  . Anxiety   . Chlamydia   . Depression   . History of abnormal cervical Papanicolaou smear   . MS (multiple sclerosis) [facility]) April 2016  . PCOS (polycystic ovarian syndrome)   . Vitamin D deficiency     PAST SURGICAL HISTORY: No past surgical history on file.  FAMILY HISTORY: Family History  Problem Relation Age of Onset  . Diabetes Father   . Hypertension Father   . Stroke Father   . Cancer Mother        lung  . Hyperlipidemia Mother   . Lung disease Mother   . Eczema Brother   . Breast cancer Neg Hx   . Ovarian cancer Neg Hx   . Colon cancer Neg Hx     SOCIAL HISTORY:  Social History   Socioeconomic History  . Marital status: Single    Spouse name: Not on file  .  Number of children: Not on file  . Years of education: 6418  . Highest education level: Master's degree (e.g., MA, MS, MEng, MEd, MSW, MBA)  Occupational History  . Occupation: Visual merchandiserClinical Social Worker  Social Needs  . Financial resource strain: Not hard at all  . Food insecurity    Worry: Never true    Inability: Never true  . Transportation needs    Medical: No    Non-medical: No  Tobacco Use  . Smoking status: Never Smoker  . Smokeless tobacco: Never Used   Substance and Sexual Activity  . Alcohol use: Yes    Comment: occasionally  . Drug use: Never  . Sexual activity: Yes    Partners: Male    Birth control/protection: Other-see comments    Comment: partner has vasectomy  Lifestyle  . Physical activity    Days per week: 0 days    Minutes per session: 0 min  . Stress: Not at all  Relationships  . Social connections    Talks on phone: More than three times a week    Gets together: Once a week    Attends religious service: 1 to 4 times per year    Active member of club or organization: No    Attends meetings of clubs or organizations: Never    Relationship status: Never married  . Intimate partner violence    Fear of current or ex partner: No    Emotionally abused: No    Physically abused: No    Forced sexual activity: No  Other Topics Concern  . Not on file  Social History Narrative   Lives with mother    Drinks soda daily (8-10 ounces)     PHYSICAL EXAM  Vitals:   08/05/19 1303  BP: 118/78  Pulse: (!) 112  Temp: (!) 96.4 F (35.8 C)  SpO2: 98%  Weight: 233 lb (105.7 kg)  Height: 5\' 2"  (1.575 m)    Body mass index is 42.62 kg/m.   General: The patient is well-developed and well-nourished and in no acute distress   Neurologic Exam  Mental status: The patient is alert and oriented x 3 at the time of the examination. The patient has apparent normal recent and remote memory, with an apparently normal attention span and concentration ability.   Speech is normal.  Cranial nerves:   Extraocular muscles are intact.  Pupils react normally.,  Color vision is normal.  Facial strength and sensation normal. Trapezius strength is strong.. No obvious hearing deficits are noted.  Motor:  Muscle bulk is normal.   Muscle tone is normal.  Strength is 5/5 in the arms and legs.  There is no drift.  Sensory:   She has normal touch and vibration sensation in the arms and legs.  Coordination: Cerebellar testing reveals good  finger-nose-finger bilaterally.  Heel-to-shin is performed well.  Gait and station: Station is normal.  Gait is normal.  Tandem gait is normal.  Romberg is negative.  Reflexes: Deep tendon reflexes are normal and symmetric.   DIAGNOSTIC DATA (LABS, IMAGING, TESTING) - I reviewed patient records, labs, notes, testing and imaging myself where available.  Lab Results  Component Value Date   WBC 9.7 02/02/2019   HGB 13.5 02/02/2019   HCT 39.6 02/02/2019   MCV 91 02/02/2019   PLT 285 02/02/2019      Component Value Date/Time   NA 139 04/28/2019 1208   NA 138 02/03/2019 0933   K 3.7 04/28/2019 1208   CL 106  04/28/2019 1208   CO2 26 04/28/2019 1208   GLUCOSE 82 04/28/2019 1208   BUN 8 04/28/2019 1208   BUN 8 02/03/2019 0933   CREATININE 0.75 04/28/2019 1208   CALCIUM 9.4 04/28/2019 1208   PROT 6.8 04/28/2019 1208   PROT 6.7 02/03/2019 0933   ALBUMIN 4.4 02/03/2019 0933   AST 16 04/28/2019 1208   ALT 13 04/28/2019 1208   ALKPHOS 45 02/03/2019 0933   BILITOT 0.4 04/28/2019 1208   BILITOT 0.3 02/03/2019 0933   GFRNONAA 109 04/28/2019 1208   GFRAA 127 04/28/2019 1208   No results found.    ASSESSMENT AND PLAN  MS (multiple sclerosis) (HCC) - Plan: Stratify JCV Antibody Test ([facility]), CBC with Differential/Platelet  High risk medication use - Plan: Stratify JCV Antibody Test ([facility]), CBC with Differential/Platelet  Vitamin D deficiency  Nocturia   1.   She will continue Tysabri.  I will check a JCV antibody and CBC with differential today.     2.   Renew lorazepam, only takes rarely for anxiety. . 3.  Stay active and exercise as tolerated.  4.   Continue vitamin D supplements.  She would prefer to take 50,000 weekly than the over-the-counter dose.   5.   She will return to see me in 6 months for regular visits and call sooner if she has new or worsening symptoms.    Luiza Carranco A. Epimenio Foot, MD, PhD 08/05/2019, 1:30 PM Certified in Neurology, Clinical Neurophysiology, Sleep  Medicine, Pain Medicine and Neuroimaging  [facility] [address] [place], [place] [address] [phone]

## 2019-08-06 LAB — CBC WITH DIFFERENTIAL/PLATELET
Basophils Absolute: 0.1 10*3/uL (ref 0.0–0.2)
Basos: 1 %
EOS (ABSOLUTE): 0.2 10*3/uL (ref 0.0–0.4)
Eos: 2 %
Hematocrit: 38.6 % (ref 34.0–46.6)
Hemoglobin: 12.9 g/dL (ref 11.1–15.9)
Immature Grans (Abs): 0 10*3/uL (ref 0.0–0.1)
Immature Granulocytes: 0 %
Lymphocytes Absolute: 3.5 10*3/uL — ABNORMAL HIGH (ref 0.7–3.1)
Lymphs: 36 %
MCH: 29.9 pg (ref 26.6–33.0)
MCHC: 33.4 g/dL (ref 31.5–35.7)
MCV: 90 fL (ref 79–97)
Monocytes Absolute: 0.5 10*3/uL (ref 0.1–0.9)
Monocytes: 5 %
Neutrophils Absolute: 5.5 10*3/uL (ref 1.4–7.0)
Neutrophils: 56 %
Platelets: 252 10*3/uL (ref 150–450)
RBC: 4.31 x10E6/uL (ref 3.77–5.28)
RDW: 13 % (ref 11.7–15.4)
WBC: 9.8 10*3/uL (ref 3.4–10.8)

## 2019-08-07 ENCOUNTER — Ambulatory Visit: Payer: 59 | Admitting: Obstetrics and Gynecology

## 2019-08-14 ENCOUNTER — Encounter: Payer: 59 | Admitting: Certified Nurse Midwife

## 2019-08-19 NOTE — Telephone Encounter (Signed)
JCV ab drawn on 08/05/19 negative, index: 0.19

## 2019-08-21 MED FILL — metFORMIN HCL ER 500 MG TB2: 500 | 90 days supply | Qty: 90 | Fill #0

## 2019-08-25 DIAGNOSIS — G35 Multiple sclerosis: Secondary | ICD-10-CM | POA: Diagnosis not present

## 2019-08-28 ENCOUNTER — Other Ambulatory Visit: Payer: Self-pay

## 2019-08-28 ENCOUNTER — Ambulatory Visit: Payer: 59 | Admitting: Family Medicine

## 2019-08-28 ENCOUNTER — Encounter: Payer: Self-pay | Admitting: Certified Nurse Midwife

## 2019-08-28 ENCOUNTER — Other Ambulatory Visit (HOSPITAL_COMMUNITY)
Admission: RE | Admit: 2019-08-28 | Discharge: 2019-08-28 | Disposition: A | Discharge status: Home / Self Care | Payer: 59 | Source: Ambulatory Visit | Attending: Certified Nurse Midwife | Admitting: Certified Nurse Midwife

## 2019-08-28 ENCOUNTER — Ambulatory Visit: Payer: 59 | Admitting: Certified Nurse Midwife

## 2019-08-28 VITALS — BP 103/74 | HR 73 | Ht 62.0 in | Wt 232.9 lb

## 2019-08-28 DIAGNOSIS — Z124 Encounter for screening for malignant neoplasm of cervix: Secondary | ICD-10-CM

## 2019-08-28 DIAGNOSIS — Z01419 Encounter for gynecological examination (general) (routine) without abnormal findings: Secondary | ICD-10-CM

## 2019-08-28 DIAGNOSIS — Z87440 Personal history of urinary (tract) infections: Secondary | ICD-10-CM

## 2019-08-28 DIAGNOSIS — R8290 Unspecified abnormal findings in urine: Secondary | ICD-10-CM | POA: Diagnosis not present

## 2019-08-28 DIAGNOSIS — E282 Polycystic ovarian syndrome: Secondary | ICD-10-CM | POA: Diagnosis not present

## 2019-08-28 LAB — POCT URINALYSIS DIPSTICK
Bilirubin, UA: NEGATIVE
Blood, UA: NEGATIVE
Glucose, UA: NEGATIVE
Nitrite, UA: NEGATIVE
Protein, UA: POSITIVE — AB
Spec Grav, UA: 1.02 (ref 1.010–1.025)
Urobilinogen, UA: 0.2 E.U./dL
pH, UA: 6.5 (ref 5.0–8.0)

## 2019-08-28 NOTE — Progress Notes (Signed)
ANNUAL PREVENTATIVE CARE GYN  ENCOUNTER NOTE  Subjective:       Tracey Lindsey is a 28 y.o. [id number] female here for a routine annual gynecologic exam.  Current complaints: 1. Malodorous urine for the last week. Treated for UTI three (3) weeks ago.   Denies difficulty breathing or respiratory distress, chest pain, abdominal pain, excessive vaginal bleeding, dysuria, and leg pain or swelling.    Gynecologic History  Patient's last menstrual period was 08/10/2019 (exact date). Period Cycle (Days): 28 Period Duration (Days): 7 Period Pattern: Regular Menstrual Flow: Moderate Menstrual Control: Tampon, Thin pad Dysmenorrhea: (!) Mild Dysmenorrhea Symptoms: Cramping  Contraception: OCP (estrogen/progesterone) and vasectomy  Last Pap: 11/2016. Results were: Negative/Negative  Obstetric History  OB History  Gravida Para Term Preterm AB Living  1 0 0 0 1 0  SAB TAB Ectopic Multiple Live Births  0 1 0 0 0    # Outcome Date GA Lbr Len/2nd Weight Sex Delivery Anes PTL Lv  1 TAB 2017            Past Medical History:  Diagnosis Date  . Anxiety   . Chlamydia   . Depression   . History of abnormal cervical Papanicolaou smear   . MS (multiple sclerosis) [facility]) April 2016  . PCOS (polycystic ovarian syndrome)   . Vitamin D deficiency     No past surgical history on file.  Current Outpatient Medications on File Prior to Visit  Medication Sig Dispense Refill  . cholecalciferol (VITAMIN D) 1000 units tablet Take 1,000 Units by mouth daily.    . clindamycin (CLEOCIN T) 1 % lotion Apply topically 2 (two) times daily. 60 mL 0  . drospirenone-ethinyl estradiol (YAZ) 3-0.02 MG tablet Take 1 tablet by mouth daily. 1 Package 11  . LORazepam (ATIVAN) 0.5 MG tablet Take 1 tablet (0.5 mg total) by mouth 2 (two) times daily as needed for anxiety. 15 tablet 1  . metFORMIN (GLUCOPHAGE XR) 500 MG 24 hr tablet Take 1 tablet (500 mg total) by mouth daily with breakfast. 90 tablet 1  . natalizumab  (TYSABRI) 300 MG/15ML injection Inject 15 mLs into the vein every 30 (thirty) days.     . vitamin C (ASCORBIC ACID) 500 MG tablet Take 500 mg by mouth daily.     Current Facility-Administered Medications on File Prior to Visit  Medication Dose Route Frequency Provider Last Rate Last Dose  . gadopentetate dimeglumine (MAGNEVIST) injection 18 mL  18 mL Intravenous Once PRN Sater, Nanine Means, MD        No Known Allergies  Social History   Socioeconomic History  . Marital status: Single    Spouse name: Not on file  . Number of children: Not on file  . Years of education: 74  . Highest education level: Master's degree (e.g., MA, MS, MEng, MEd, MSW, MBA)  Occupational History  . Occupation: Holiday representative  Social Needs  . Financial resource strain: Not hard at all  . Food insecurity    Worry: Never true    Inability: Never true  . Transportation needs    Medical: No    Non-medical: No  Tobacco Use  . Smoking status: Never Smoker  . Smokeless tobacco: Never Used  Substance and Sexual Activity  . Alcohol use: Yes    Comment: occasionally  . Drug use: Never  . Sexual activity: Yes    Partners: Male    Birth control/protection: Other-see comments, Pill    Comment: partner has vasectomy  Lifestyle  . Physical activity    Days per week: 0 days    Minutes per session: 0 min  . Stress: Not at all  Relationships  . Social connections    Talks on phone: More than three times a week    Gets together: Once a week    Attends religious service: 1 to 4 times per year    Active member of club or organization: No    Attends meetings of clubs or organizations: Never    Relationship status: Never married  . Intimate partner violence    Fear of current or ex partner: No    Emotionally abused: No    Physically abused: No    Forced sexual activity: No  Other Topics Concern  . Not on file  Social History Narrative   Lives with mother    Drinks soda daily (8-10 ounces)     Family History  Problem Relation Age of Onset  . Diabetes Father   . Hypertension Father   . Stroke Father   . Cancer Mother        lung  . Hyperlipidemia Mother   . Lung disease Mother   . Eczema Brother   . Breast cancer Neg Hx   . Ovarian cancer Neg Hx   . Colon cancer Neg Hx     The following portions of the patient's history were reviewed and updated as appropriate: allergies, current medications, past family history, past medical history, past social history, past surgical history and problem list.  Review of Systems  ROS negative except as noted above. Information obtained from patient.    Objective:   BP 103/74   Pulse 73   Ht 5\' 2"  (1.575 m)   Wt 232 lb 14.4 oz (105.6 kg)   LMP 08/10/2019 (Exact Date)   BMI 42.60 kg/m    CONSTITUTIONAL: Well-developed, well-nourished female in no acute distress.   PSYCHIATRIC: Normal mood and affect. Normal behavior. Normal judgment and thought content.  NEUROLGIC: Alert and oriented to person, place, and time. Normal muscle tone coordination. No cranial nerve deficit noted.  HENT:  Normocephalic, atraumatic, External right and left ear normal.  EYES: Conjunctivae and EOM are normal. Pupils are equal and round.   NECK: Normal range of motion, supple, no masses.  Normal thyroid.   SKIN: Skin is warm and dry. No rash noted. Not diaphoretic. No erythema. No pallor.  CARDIOVASCULAR: Normal heart rate noted, regular rhythm, no murmur.  RESPIRATORY: Clear to auscultation bilaterally. Effort and breath sounds normal, no problems with respiration noted.  BREASTS: Symmetric in size. No masses, skin changes, nipple drainage, or lymphadenopathy.  ABDOMEN: Soft, normal bowel sounds, no distention noted.  No tenderness, rebound or guarding. Obese.   PELVIC:  External Genitalia: Normal  BUS: Normal  Vagina: Normal  Cervix: Normal  Uterus: Normal  Adnexa: Normal   MUSCULOSKELETAL: Normal range of motion. No tenderness.  No  cyanosis, clubbing, or edema.  2+ distal pulses.  LYMPHATIC: No Axillary, Supraclavicular, or Inguinal Adenopathy.  Assessment:   Annual gynecologic examination 28 y.o.   Contraception: OCP (estrogen/progesterone) and vasectomy  Obesity 3   Problem List Items Addressed This Visit    None    Visit Diagnoses    Well woman exam    -  Primary   Relevant Orders   Urine Culture   Screening for cervical cancer       Abnormal urine odor       Relevant Orders   POCT  urinalysis dipstick (Completed)   Urine Culture   History of UTI       Relevant Orders   Urine Culture   PCOS (polycystic ovarian syndrome)          Plan:   Pap: Pap, Reflex if ASCUS   Labs: Declined   Routine preventative health maintenance measures emphasized: Exercise/Diet/Weight control, Tobacco Warnings, Alcohol/Substance use risks, Stress Management and Safe Sex; see AVS  Reviewed red flag symptoms and when to call  RTC x 1 year for ANNUAL EXAM or sooner if needed   Gunnar BullaJenkins Michelle Tongela Encinas, CNM [facility], [id number]

## 2019-08-28 NOTE — Progress Notes (Signed)
Patient here for annual exam.  Patient c/o urine has strong odor x1 week, finished antibiotic for UTI 3 weeks ago.

## 2019-08-28 NOTE — Patient Instructions (Signed)
Diet for Polycystic Ovary Syndrome Polycystic ovary syndrome (PCOS) is a disorder of the chemicals (hormones) that regulate a woman's reproductive system, including monthly periods (menstruation). The condition causes important hormones to be out of balance. PCOS can:  Stop your periods or make them irregular.  Cause cysts to develop on your ovaries.  Make it difficult to get pregnant.  Stop your body from responding to the effects of insulin (insulin resistance). Insulin resistance can lead to obesity and diabetes. Changing what you eat can help you manage PCOS and improve your health. Following a balanced diet can help you lose weight and improve the way that your body uses insulin. What are tips for following this plan?  Follow a balanced diet for meals and snacks. Eat breakfast, lunch, dinner, and one or two snacks every day.  Include protein in each meal and snack.  Choose whole grains instead of products that are made with refined flour.  Eat a variety of foods.  Exercise regularly as told by your health care provider. Aim to do 30 or more minutes of exercise on most days of the week.  If you are overweight or obese: ? Pay attention to how many calories you eat. Cutting down on calories can help you lose weight. ? Work with your health care provider or a diet and nutrition specialist (dietitian) to figure out how many calories you need each day. What foods can I eat?  Fruits Include a variety of colors and types. All fruits are helpful for PCOS. Vegetables Include a variety of colors and types. All vegetables are helpful for PCOS. Grains Whole grains, such as whole wheat. Whole-grain breads, crackers, cereals, and pasta. Unsweetened oatmeal, bulgur, barley, quinoa, and brown rice. Tortillas made from corn or whole-wheat flour. Meats and other proteins Low-fat (lean) proteins, such as fish, chicken, beans, eggs, and tofu. Dairy Low-fat dairy products, such as skim milk,  cheese sticks, and yogurt. Beverages Low-fat or fat-free drinks, such as water, low-fat milk, sugar-free drinks, and small amounts of 100% fruit juice. Seasonings and condiments Ketchup. Mustard. Barbecue sauce. Relish. Low-fat or fat-free mayonnaise. Fats and oils Olive oil or canola oil. Walnuts and almonds. The items listed above may not be a complete list of recommended foods and beverages. Contact a dietitian for more options. What foods are not recommended? Foods that are high in calories or fat. Fried foods. Sweets. Products that are made from refined white flour, including white bread, pastries, white rice, and pasta. The items listed above may not be a complete list of foods and beverages to avoid. Contact a dietitian for more information. Summary  PCOS is a hormonal imbalance that affects a woman's reproductive system.  You can help to manage your PCOS by exercising regularly and eating a healthy, varied diet of vegetables, fruit, whole grains, low-fat (lean) protein, and low-fat dairy products.  Changing what you eat can improve the way that your body uses insulin, help your hormones reach normal levels, and help you lose weight. This information is not intended to replace advice given to you by your health care provider. Make sure you discuss any questions you have with your health care provider. Document Released: 03/12/2016 Document Revised: 03/11/2019 Document Reviewed: 09/23/2017 Elsevier Patient Education  2020 [place] 28 Years Old, Female Preventive care refers to visits with your health care provider and lifestyle choices that can promote health and wellness. This includes:  A yearly physical exam. This may also be called an annual well  check.  Regular dental visits and eye exams.  Immunizations.  Screening for certain conditions.  Healthy lifestyle choices, such as eating a healthy diet, getting regular exercise, not using drugs or  products that contain nicotine and tobacco, and limiting alcohol use. What can I expect for my preventive care visit? Physical exam Your health care provider will check your:  Height and weight. This may be used to calculate body mass index (BMI), which tells if you are at a healthy weight.  Heart rate and blood pressure.  Skin for abnormal spots. Counseling Your health care provider may ask you questions about your:  Alcohol, tobacco, and drug use.  Emotional well-being.  Home and relationship well-being.  Sexual activity.  Eating habits.  Work and work Statistician.  Method of birth control.  Menstrual cycle.  Pregnancy history. What immunizations do I need?  Influenza (flu) vaccine  This is recommended every year. Tetanus, diphtheria, and pertussis (Tdap) vaccine  You may need a Td booster every 10 years. Varicella (chickenpox) vaccine  You may need this if you have not been vaccinated. Human papillomavirus (HPV) vaccine  If recommended by your health care provider, you may need three doses over 6 months. Measles, mumps, and rubella (MMR) vaccine  You may need at least one dose of MMR. You may also need a second dose. Meningococcal conjugate (MenACWY) vaccine  One dose is recommended if you are age 20-21 years and a first-year college student living in a residence hall, or if you have one of several medical conditions. You may also need additional booster doses. Pneumococcal conjugate (PCV13) vaccine  You may need this if you have certain conditions and were not previously vaccinated. Pneumococcal polysaccharide (PPSV23) vaccine  You may need one or two doses if you smoke cigarettes or if you have certain conditions. Hepatitis A vaccine  You may need this if you have certain conditions or if you travel or work in places where you may be exposed to hepatitis A. Hepatitis B vaccine  You may need this if you have certain conditions or if you travel or work  in places where you may be exposed to hepatitis B. Haemophilus influenzae type b (Hib) vaccine  You may need this if you have certain conditions. You may receive vaccines as individual doses or as more than one vaccine together in one shot (combination vaccines). Talk with your health care provider about the risks and benefits of combination vaccines. What tests do I need?  Blood tests  Lipid and cholesterol levels. These may be checked every 5 years starting at age 26.  Hepatitis C test.  Hepatitis B test. Screening  Diabetes screening. This is done by checking your blood sugar (glucose) after you have not eaten for a while (fasting).  Sexually transmitted disease (STD) testing.  BRCA-related cancer screening. This may be done if you have a family history of breast, ovarian, tubal, or peritoneal cancers.  Pelvic exam and Pap test. This may be done every 3 years starting at age 25. Starting at age 71, this may be done every 5 years if you have a Pap test in combination with an HPV test. Talk with your health care provider about your test results, treatment options, and if necessary, the need for more tests. Follow these instructions at home: Eating and drinking   Eat a diet that includes fresh fruits and vegetables, whole grains, lean protein, and low-fat dairy.  Take vitamin and mineral supplements as recommended by your health care provider.  Do not drink alcohol if: ? Your health care provider tells you not to drink. ? You are pregnant, may be pregnant, or are planning to become pregnant.  If you drink alcohol: ? Limit how much you have to 0-1 drink a day. ? Be aware of how much alcohol is in your drink. In the [place] one drink equals one 12 oz bottle of beer (355 mL), one 5 oz glass of wine (148 mL), or one 1 oz glass of hard liquor (44 mL). Lifestyle  Take daily care of your teeth and gums.  Stay active. Exercise for at least 30 minutes on 5 or more days each week.   Do not use any products that contain nicotine or tobacco, such as cigarettes, e-cigarettes, and chewing tobacco. If you need help quitting, ask your health care provider.  If you are sexually active, practice safe sex. Use a condom or other form of birth control (contraception) in order to prevent pregnancy and STIs (sexually transmitted infections). If you plan to become pregnant, see your health care provider for a preconception visit. What's next?  Visit your health care provider once a year for a well check visit.  Ask your health care provider how often you should have your eyes and teeth checked.  Stay up to date on all vaccines. This information is not intended to replace advice given to you by your health care provider. Make sure you discuss any questions you have with your health care provider. Document Released: 01/15/2002 Document Revised: 07/31/2018 Document Reviewed: 07/31/2018 Elsevier Patient Education  2020 [employer].

## 2019-09-20 ENCOUNTER — Encounter: Payer: Self-pay | Admitting: Certified Nurse Midwife

## 2019-09-22 DIAGNOSIS — G35 Multiple sclerosis: Secondary | ICD-10-CM | POA: Diagnosis not present

## 2019-10-20 DIAGNOSIS — G35 Multiple sclerosis: Secondary | ICD-10-CM | POA: Diagnosis not present

## 2019-11-18 DIAGNOSIS — G35 Multiple sclerosis: Secondary | ICD-10-CM | POA: Diagnosis not present

## 2019-12-17 DIAGNOSIS — G35 Multiple sclerosis: Secondary | ICD-10-CM | POA: Diagnosis not present

## 2020-01-14 DIAGNOSIS — G35 Multiple sclerosis: Secondary | ICD-10-CM | POA: Diagnosis not present

## 2020-01-25 DIAGNOSIS — H5211 Myopia, right eye: Secondary | ICD-10-CM | POA: Diagnosis not present

## 2020-02-03 ENCOUNTER — Ambulatory Visit: Payer: 59 | Admitting: Neurology

## 2020-02-03 ENCOUNTER — Other Ambulatory Visit: Payer: Self-pay

## 2020-02-03 ENCOUNTER — Encounter: Payer: Self-pay | Admitting: Neurology

## 2020-02-03 ENCOUNTER — Telehealth: Payer: Self-pay | Admitting: *Deleted

## 2020-02-03 VITALS — BP 119/75 | HR 86 | Temp 97.4°F | Ht 63.0 in | Wt 225.0 lb

## 2020-02-03 DIAGNOSIS — R351 Nocturia: Secondary | ICD-10-CM | POA: Diagnosis not present

## 2020-02-03 DIAGNOSIS — E559 Vitamin D deficiency, unspecified: Secondary | ICD-10-CM

## 2020-02-03 DIAGNOSIS — G35 Multiple sclerosis: Secondary | ICD-10-CM

## 2020-02-03 DIAGNOSIS — R200 Anesthesia of skin: Secondary | ICD-10-CM

## 2020-02-03 DIAGNOSIS — Z79899 Other long term (current) drug therapy: Secondary | ICD-10-CM | POA: Diagnosis not present

## 2020-02-03 NOTE — Telephone Encounter (Signed)
Placed [facility] lab in [facility] lock box for routine lab pick up. Results pending. 

## 2020-02-03 NOTE — Progress Notes (Signed)
[facility]  PATIENT: Tracey Lindsey DOB: 03/05/1991  REFERRING DOCTOR OR PCP:  Luan Pulling Jaci Lazier) SOURCE: Patient and ER records and PACS images  _________________________________   HISTORICAL  CHIEF COMPLAINT:  Chief Complaint  Patient presents with  . Multiple Sclerosis    rm 12, 6 month FU "next Tysabri infusion is next week; no new problems or concerns"    HISTORY OF PRESENT ILLNESS:  Tracey Lindsey is a 29 y.o. woman recently diagnosed with MS.     Update 02/03/2020: She is on Tysabri and feels her MS is stable.   No recent exacerbation and no new symptoms.  Her last MRI showed no new MS lesions.    Gait is doing well.   Balance is doing well.    She denies weakness.   She notes some numbness in her legs when sitting on a toilet but left foot numbness resolved..  Bladder function is the same with some frequency but no incontinence.  Marland Kitchen  She wakes up 2-3 times nightly to urinate.      Vision is the same --- right eye blurry but left and binocular is 20/20.    She notes some fatigue that worsens as day goes on.    She gets everything done during the day that she needs to do.   She sleeps well and quickly falls back to sleep after nocturia.  She denies depression but has mild anxiety.   Cognition is doing well.    We discussed the vaccination and I recommended that she gets it.   She works in Engineer, site.    Update 08/05/2019: She is on Tysabri and denies any exacerbation.   Tolerates it well.   She is JCV Ab negative (last checked 02/2019).  MRI of the brain 2019 showed no new lesions.  Her gait is doing well.   She occasional feels slightly off balanced if standing still but no problems climbing stairs.     No weakness or numbness.   Bladder function is ok though she has nocturia x 2..   Vision is fine.  She notes some fatigue.   She is sleeping well (falls back asleep easily most nights after nocturia).    She is on OCP for PCOS.   She feels a  little more anxious but not depressed.    She feels she is a bit more reserved due to mild anxiety but ist does not prevent her from doing what she needs to do.   Cognition is fine.  Occasionally, she has some short term memory issue but does ok with a hint.    She started Vit D 50000 units weekly as she prefers that to OTC daily.    Update 02/02/2019: She has done well on Tysabri.   MRI 08/2018 showed no new lesions.   She tolerates it well and has no exacerbation.  Last JCV Ab is 0.23 (negative inhib assay).   Gait is good.   She can climb stairs and ladders and no recent falls.   Bladder function is good.   She has nocturia 1-2 times but no incontinence.    Vision is doing well.    She likely had ON in 2014.     She has fatigue.   She sleeps well most nights.   She is planning on joining a weight management programs.  She has mild depression and some anxiety but only neds lorazepam once a week.   No recent panic attacks.  Cognition is good.   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       Update 07/31/2018: She is on Tysabri and she tolerates it well.    She denies any new MS symptoms and no exacerbation.    She is walking well and she can climb stairs and a ladder.     She denies weakness or spasticity    Her fingers feel tigh if she does a lot of typing.    Bladder function is ok with mild frequency.     Vision is doing well.  She has some fatigue.   She sleeps well most nights.    She denies depression but notes some anxiety.   She takes lorazepam prn.   No recent panic attacks.   She deneis any cognitive issues.    Her last MRis were from 10/2016 and showed no new lesions.  She was diagnosed with PCOS and placed on hormone therapy.  Update 01/30/2018: Her MS has done very well on Tysabri. She has not had any exacerbations. She tolerates the medication well. Her JCV antibody was last tested 07/24/2017. It was negative at 0.11 at that time..       She has urinary frequency but no incontinence.    Vision is fine.      She has some fatigue but is able to do everthing that she wants to do.    She denies depression but notes generalized anxiety.   There are no panic attacks.   She takes lorazepam just 2 x a month on average.    She has some insomnia, also has some nocturia, twice most nights. She denies any difficulty with her cognition.  Her headaches are doing better.   She took imipramine.   HA improved but not sure if due to imipramine..   Imipramine helped her sleep and nocturia. She stopped after a few weeks.      From 07/24/2017:  MS:  She is on Tysabri and tolerates it well.  She is JCV Ab negative (0.08  on 01/24/2017)   She denies any new exacerbation.   Cervical spine and brain November 2017 was personally reviewed. There were no new lesions. The previously enhancing lesions were smaller and no longer enhanced.  Headaches:   She is still getting frequent headaches. (>25/30 a month).   Pain is bilateral and is usually mild to moderate.   It builds up daily at work.  HA is sometimes present when she wakes up though.  She gets a pressure sensation.  HA is usually milder the next morning.   It only seems to occur when she tries to go to sleep.  No nausea or vomiting.   Moving does not alter the pain much.   She denies occipital pain.    She notes pain in the back of the head/upper neck as well.   She does not take anything for the headache.  She is not sure if imipramine helped any when she tried in the past.  Gait/strength/sensation:   Her gait is back to baseline. She denies any weakness or spasticity in the arms or legs. There is no numbness.   Bladder/bowel:  She has urinary frequency only at night.    She has 3-4 times nocturia many nights.   No incontinence.  No recent UTI's.   No hesitancy.   Bowel is fine  Vision/ears:  Diplopia resolved. She denies any history of optic neuritis. She does not have any visual acuity issues or problems with  color vision..  Fatigue/sleep:   She reports mild fatigue. This is  worse with heat. She is working full-time.      She wakes up some at night but usually falls back asleep.   he is sleeping worse with frequent awakenings for nocturia.     Imipramine helped but caused a hangover.     Mood/Cognition:   She notes some anxiety but no depression.   She denies any significant cognitive change.   MS History:   In retrospect, she had an episode of vertigo in 2014 with mild vision changes that improved.    In March 2016, she had vertigo that improved after a couple weeks.  In late April, she had  numbness in the left foot. When she woke up the next morning, she noted that the numbness was higher up the left arm and the next day it also involved the left hand and she went to the emergency room. In the emergency room, she had an MRI of the brain that showed white matter foci were some for multiple sclerosis.   MRI of the brain with and without contrast 03/29/15 showed several posterior fossa lesions with one in the right middle cerebellar peduncle and several in the pons.   The middle cerebellar peduncle focus and 2 of the pontine foci enhanced. There are also several periventricular and deep white matter foci in the cerebral hemispheres.   MRI cervical spine shows one enhancing focus.  Repeat imaging of the brain and cervical spine November 2017 did not show any new lesions. The previously enhancing lesions were smaller and did not enhance.Marland Kitchen       REVIEW OF SYSTEMS: Constitutional: No fevers, chills, sweats, or change in appetite.   She notes some fatigue. Eyes: No visual changes, double vision, eye pain Ear, nose and throat: No hearing loss, ear pain, nasal congestion, sore throat Cardiovascular: No chest pain, palpitations Respiratory: No shortness of breath at rest or with exertion.   No wheezes GastrointestinaI: No nausea, vomiting, diarrhea, abdominal pain, fecal incontinence Genitourinary: Notes frequency, nocturia (x2).   She has PCOS Musculoskeletal: No neck pain,  back pain Integumentary: No rash, pruritus, skin lesions Neurological: as above Psychiatric: as above  Endocrine: No palpitations, diaphoresis, change in appetite, change in weigh or increased thirst Hematologic/Lymphatic: No anemia, purpura, petechiae. Allergic/Immunologic: No itchy/runny eyes, nasal congestion, recent allergic reactions, rashes  ALLERGIES: No Known Allergies  HOME MEDICATIONS:  Current Outpatient Medications:  .  cholecalciferol (VITAMIN D) 1000 units tablet, Take 1,000 Units by mouth daily., Disp: , Rfl:  .  clindamycin (CLEOCIN T) 1 % lotion, Apply topically 2 (two) times daily., Disp: 60 mL, Rfl: 0 .  drospirenone-ethinyl estradiol (YAZ) 3-0.02 MG tablet, Take 1 tablet by mouth daily., Disp: 1 Package, Rfl: 11 .  LORazepam (ATIVAN) 0.5 MG tablet, Take 1 tablet (0.5 mg total) by mouth 2 (two) times daily as needed for anxiety., Disp: 15 tablet, Rfl: 1 .  natalizumab (TYSABRI) 300 MG/15ML injection, Inject 15 mLs into the vein every 30 (thirty) days. , Disp: , Rfl:  .  vitamin C (ASCORBIC ACID) 500 MG tablet, Take 500 mg by mouth daily., Disp: , Rfl:  No current facility-administered medications for this visit.  Facility-Administered Medications Ordered in Other Visits:  .  gadopentetate dimeglumine (MAGNEVIST) injection 18 mL, 18 mL, Intravenous, Once PRN, , Pearletha Furl, MD  PAST MEDICAL HISTORY: Past Medical History:  Diagnosis Date  . Anxiety   . Chlamydia   . Depression   .  History of abnormal cervical Papanicolaou smear   . MS (multiple sclerosis) [facility]) April 2016  . PCOS (polycystic ovarian syndrome)   . Vitamin D deficiency     PAST SURGICAL HISTORY: No past surgical history on file.  FAMILY HISTORY: Family History  Problem Relation Age of Onset  . Diabetes Father   . Hypertension Father   . Stroke Father   . Cancer Mother        lung  . Hyperlipidemia Mother   . Lung disease Mother   . Eczema Brother   . Breast cancer Neg Hx   .  Ovarian cancer Neg Hx   . Colon cancer Neg Hx     SOCIAL HISTORY:  Social History   Socioeconomic History  . Marital status: Single    Spouse name: Not on file  . Number of children: Not on file  . Years of education: 77  . Highest education level: Master's degree (e.g., MA, MS, MEng, MEd, MSW, MBA)  Occupational History  . Occupation: Visual merchandiser  Tobacco Use  . Smoking status: Never Smoker  . Smokeless tobacco: Never Used  Substance and Sexual Activity  . Alcohol use: Yes    Comment: occasionally  . Drug use: Never  . Sexual activity: Yes    Partners: Male    Birth control/protection: Other-see comments, Pill    Comment: partner has vasectomy  Other Topics Concern  . Not on file  Social History Narrative   Lives with mother    Drinks soda daily (8-10 ounces)   Social Determinants of Health   Financial Resource Strain: Low Risk   . Difficulty of Paying Living Expenses: Not hard at all  Food Insecurity: No Food Insecurity  . Worried About Programme researcher, broadcasting/film/video in the Last Year: Never true  . Ran Out of Food in the Last Year: Never true  Transportation Needs: No Transportation Needs  . Lack of Transportation (Medical): No  . Lack of Transportation (Non-Medical): No  Physical Activity: Inactive  . Days of Exercise per Week: 0 days  . Minutes of Exercise per Session: 0 min  Stress: No Stress Concern Present  . Feeling of Stress : Not at all  Social Connections: Somewhat Isolated  . Frequency of Communication with Friends and Family: More than three times a week  . Frequency of Social Gatherings with Friends and Family: Once a week  . Attends Religious Services: 1 to 4 times per year  . Active Member of Clubs or Organizations: No  . Attends Banker Meetings: Never  . Marital Status: Never married  Intimate Partner Violence: Not At Risk  . Fear of Current or Ex-Partner: No  . Emotionally Abused: No  . Physically Abused: No  . Sexually  Abused: No     PHYSICAL EXAM  Vitals:   02/03/20 1257  BP: 119/75  Pulse: 86  Temp: (!) 97.4 F (36.3 C)  Weight: 225 lb (102.1 kg)  Height: 5\' 3"  (1.6 m)    Body mass index is 39.86 kg/m.   General: The patient is well-developed and well-nourished and in no acute distress   Neurologic Exam  Mental status: The patient is alert and oriented x 3 at the time of the examination. The patient has apparent normal recent and remote memory, with an apparently normal attention span and concentration ability.   Speech is normal.  Cranial nerves:   Extraocular muscles are intact.  Pupils react normally.,  Color vision is normal.  Facial  strength and sensation normal. Trapezius strength is strong.. Slight reduced hearing AD vs. AS.    Motor:  Muscle bulk is normal.   Muscle tone is normal.  Strength is 5/5 in the arms and legs.  Normal RAM in hands.   Sensory:   She has normal touch and vibration sensation in the arms and legs.  Coordination: Cerebellar testing reveals good finger-nose-finger bilaterally.  Heel-to-shin is performed well.  Gait and station: Station is normal.  Gait and tandem gait are normal.     Reflexes: Deep tendon reflexes are normal and symmetric.   DIAGNOSTIC DATA (LABS, IMAGING, TESTING) - I reviewed patient records, labs, notes, testing and imaging myself where available.  Lab Results  Component Value Date   WBC 9.8 08/05/2019   HGB 12.9 08/05/2019   HCT 38.6 08/05/2019   MCV 90 08/05/2019   PLT 252 08/05/2019      Component Value Date/Time   NA 139 04/28/2019 1208   NA 138 02/03/2019 0933   K 3.7 04/28/2019 1208   CL 106 04/28/2019 1208   CO2 26 04/28/2019 1208   GLUCOSE 82 04/28/2019 1208   BUN 8 04/28/2019 1208   BUN 8 02/03/2019 0933   CREATININE 0.75 04/28/2019 1208   CALCIUM 9.4 04/28/2019 1208   PROT 6.8 04/28/2019 1208   PROT 6.7 02/03/2019 0933   ALBUMIN 4.4 02/03/2019 0933   AST 16 04/28/2019 1208   ALT 13 04/28/2019 1208    ALKPHOS 45 02/03/2019 0933   BILITOT 0.4 04/28/2019 1208   BILITOT 0.3 02/03/2019 0933   GFRNONAA 109 04/28/2019 1208   GFRAA 127 04/28/2019 1208   No results found.    ASSESSMENT AND PLAN  Multiple sclerosis (HCC) - Plan: CBC with Differential/Platelet, Stratify JCV Antibody Test ([facility]), MR BRAIN WO CONTRAST, MR CERVICAL SPINE WO CONTRAST  High risk medication use - Plan: Stratify JCV Antibody Test ([facility])  Nocturia  Vitamin D deficiency - Plan: VITAMIN D 25 Hydroxy (Vit-D Deficiency, Fractures)  Numbness - Plan: Vitamin B12   1.   She will continue Tysabri.  I will check a JCV antibody and CBC with differential today.     2.   MRI of the brain and cervical spine to determine if any subclinical progression and consider a change to Ocrevus if this is occurring.  . 3.  Stay active and exercise as tolerated.  4.   Continue vitamin D supplements.   Check level today5.   She will return to see me in 6 months for regular visits and call sooner if she has new or worsening symptoms.     A. Epimenio Foot, MD, PhD 02/03/2020, 1:32 PM Certified in Neurology, Clinical Neurophysiology, Sleep Medicine, Pain Medicine and Neuroimaging  [facility] [address] [place], [place] [address] [phone]

## 2020-02-04 LAB — CBC WITH DIFFERENTIAL/PLATELET
Basophils Absolute: 0.1 10*3/uL (ref 0.0–0.2)
Basos: 1 %
EOS (ABSOLUTE): 0.2 10*3/uL (ref 0.0–0.4)
Eos: 2 %
Hematocrit: 36.3 % (ref 34.0–46.6)
Hemoglobin: 12.4 g/dL (ref 11.1–15.9)
Immature Grans (Abs): 0.1 10*3/uL (ref 0.0–0.1)
Immature Granulocytes: 1 %
Lymphocytes Absolute: 3.8 10*3/uL — ABNORMAL HIGH (ref 0.7–3.1)
Lymphs: 37 %
MCH: 30.5 pg (ref 26.6–33.0)
MCHC: 34.2 g/dL (ref 31.5–35.7)
MCV: 89 fL (ref 79–97)
Monocytes Absolute: 0.7 10*3/uL (ref 0.1–0.9)
Monocytes: 7 %
Neutrophils Absolute: 5.4 10*3/uL (ref 1.4–7.0)
Neutrophils: 52 %
Platelets: 242 10*3/uL (ref 150–450)
RBC: 4.07 x10E6/uL (ref 3.77–5.28)
RDW: 12.9 % (ref 11.7–15.4)
WBC: 10.1 10*3/uL (ref 3.4–10.8)

## 2020-02-04 LAB — VITAMIN D 25 HYDROXY (VIT D DEFICIENCY, FRACTURES): Vit D, 25-Hydroxy: 50.5 ng/mL (ref 30.0–100.0)

## 2020-02-04 LAB — VITAMIN B12: Vitamin B-12: 258 pg/mL (ref 232–1245)

## 2020-02-10 ENCOUNTER — Telehealth: Payer: Self-pay | Admitting: *Deleted

## 2020-02-10 ENCOUNTER — Telehealth: Payer: Self-pay | Admitting: Neurology

## 2020-02-10 NOTE — Telephone Encounter (Signed)
Took call from phone staff and spoke with pt. She was actually calling back to speak with Irving Burton to get her MRI's scheduled. I placed her on hold. Irving Burton already left office for the day. Advised I will send her message to give her a call back tomorrow morning to get scheduled. She verbalized understanding.

## 2020-02-10 NOTE — Telephone Encounter (Signed)
LVM for pt to call back about scheduling mri  [facility] Auth: NPR Ref # [id number]

## 2020-02-10 NOTE — Telephone Encounter (Signed)
JCV ab drawn on 02/03/20 negative, index: 0.15

## 2020-02-11 DIAGNOSIS — G35 Multiple sclerosis: Secondary | ICD-10-CM | POA: Diagnosis not present

## 2020-02-11 NOTE — Telephone Encounter (Signed)
I spoke to the patient she is scheduled at [facility] for 02/17/20.

## 2020-02-15 ENCOUNTER — Telehealth: Payer: 59 | Admitting: Physician Assistant

## 2020-02-15 DIAGNOSIS — R42 Dizziness and giddiness: Secondary | ICD-10-CM

## 2020-02-15 NOTE — Progress Notes (Signed)
Based on what you shared with me, I feel your condition warrants further evaluation and I recommend that you be seen for a face to face office visit.  Your symptoms sound quite atypical for an ear infection. There could be a number of reasons to explain your symptoms, but it is difficult to say through an e-visit. I would recommend face to face evaluation at your PCP's office or an urgent care today so that someone can examine your ears and also perform a neurologic examination to better understand what is causing your dizziness, nausea, and headache.    NOTE: If you entered your credit card information for this eVisit, you will not be charged. You may see a "hold" on your card for the $35 but that hold will drop off and you will not have a charge processed.   If you are having a true medical emergency please call 911.      For an urgent face to face visit, [facility] has five urgent care centers for your convenience:      NEW:  [facility] Directions [phone] [address] [place], [place] [address] . 10 am - 6pm Monday - Friday    [facility]) Get Driving Directions [phone] [address] [place], [place] [address] . 10 am to 8 pm Monday-Friday . 12 pm to 8 pm [facility] at [facility] Get Driving Directions [phone] [address] [place], [place] [address] . 8 am to 8 pm Monday-Friday . 9 am to 6 pm Saturday . 11 am to 6 pm Sunday     [facility] at [facility] Get Driving Directions  [phone] [address] [place], [place] [address] . 8 am to 8 pm Monday-Friday . 8 am to 4 pm [facility] at [facility] Directions [phone] [address] [place], [place] [address] . 12 pm to 6 pm Monday-Friday      Your e-visit answers were  reviewed by a board certified advanced clinical practitioner to complete your personal care plan.  Thank you for using e-Visits.   Greater than 5 minutes, yet less than 10 minutes of time have been spent researching, coordinating, and implementing care for this patient today.

## 2020-02-17 ENCOUNTER — Other Ambulatory Visit: Payer: Self-pay

## 2020-02-17 ENCOUNTER — Ambulatory Visit: Payer: 59

## 2020-02-17 DIAGNOSIS — G35 Multiple sclerosis: Secondary | ICD-10-CM

## 2020-03-14 DIAGNOSIS — G35 Multiple sclerosis: Secondary | ICD-10-CM | POA: Diagnosis not present

## 2020-04-11 DIAGNOSIS — G35 Multiple sclerosis: Secondary | ICD-10-CM | POA: Diagnosis not present

## 2020-04-18 ENCOUNTER — Telehealth: Payer: Self-pay | Admitting: *Deleted

## 2020-04-18 NOTE — Telephone Encounter (Signed)
Faxed completed/signed Tysabri pt status report and reauth questionnaire to [facility] at [phone]. Received confirmation.   Received fax back that pt re-authorized from 04/18/20-11/19/20. Pt enrollment number: [id number]. Account: [facility]. Site auth number: [id number]

## 2020-04-29 ENCOUNTER — Encounter: Payer: 59 | Admitting: Family Medicine

## 2020-05-09 DIAGNOSIS — G35 Multiple sclerosis: Secondary | ICD-10-CM | POA: Diagnosis not present

## 2020-05-30 ENCOUNTER — Other Ambulatory Visit (HOSPITAL_COMMUNITY)
Admission: RE | Admit: 2020-05-30 | Discharge: 2020-05-30 | Disposition: A | Discharge status: Home / Self Care | Payer: 59 | Source: Ambulatory Visit | Attending: Family Medicine | Admitting: Family Medicine

## 2020-05-30 ENCOUNTER — Other Ambulatory Visit: Payer: Self-pay

## 2020-05-30 ENCOUNTER — Ambulatory Visit: Payer: 59 | Admitting: Family Medicine

## 2020-05-30 ENCOUNTER — Encounter: Payer: Self-pay | Admitting: Family Medicine

## 2020-05-30 VITALS — BP 110/76 | HR 72 | Temp 97.9°F | Resp 16 | Ht 62.5 in | Wt 217.2 lb

## 2020-05-30 DIAGNOSIS — E282 Polycystic ovarian syndrome: Secondary | ICD-10-CM | POA: Diagnosis not present

## 2020-05-30 DIAGNOSIS — R102 Pelvic and perineal pain: Secondary | ICD-10-CM | POA: Diagnosis not present

## 2020-05-30 DIAGNOSIS — Z0000 Encounter for general adult medical examination without abnormal findings: Secondary | ICD-10-CM | POA: Diagnosis not present

## 2020-05-30 DIAGNOSIS — E7849 Other hyperlipidemia: Secondary | ICD-10-CM

## 2020-05-30 DIAGNOSIS — Z113 Encounter for screening for infections with a predominantly sexual mode of transmission: Secondary | ICD-10-CM

## 2020-05-30 DIAGNOSIS — E8881 Metabolic syndrome: Secondary | ICD-10-CM

## 2020-05-30 DIAGNOSIS — N390 Urinary tract infection, site not specified: Secondary | ICD-10-CM

## 2020-05-30 LAB — POCT URINALYSIS DIPSTICK
Appearance: NORMAL
Bilirubin, UA: NEGATIVE
Glucose, UA: NEGATIVE
Ketones, UA: 15
Leukocytes, UA: NEGATIVE
Nitrite, UA: NEGATIVE
Odor: NORMAL
Protein, UA: POSITIVE — AB
Spec Grav, UA: >=1.03 — AB (ref 1.010–1.025)
Urobilinogen, UA: 0.2 E.U./dL
pH, UA: 6 (ref 5.0–8.0)

## 2020-05-30 LAB — POCT URINE PREGNANCY: Preg Test, Ur: NEGATIVE

## 2020-05-30 NOTE — Progress Notes (Signed)
Patient: Tracey Lindsey, Female    DOB: 1991-08-10, 29 y.o.   MRN: [id number] Delsa Grana, PA-C Visit Date: 05/30/2020  Today's Provider: Delsa Grana, PA-C   Chief Complaint  Patient presents with  . Annual Exam    see's gyno  . SEXUALLY TRANSMITTED DISEASE    testing   Subjective:   Annual physical exam:  Tracey Lindsey is a 29 y.o. female who presents today for complete physical exam:  Exercise/Activity:  3x a week going to the gym  Diet/nutrition:  Cutting back on carbs and sugar  Sleep:  Well   Pt wished to discuss acute complaints do routine Urinary sx, pelvic and back pain  She is concerned with recent unprotected sex and with history of recurrent UTIs (see multiple times through ED visits not evaluated in person and no urine testing recently) concerned that she may have a UTI and she would also like be checked for STDs.  She has vague lower pelvic/suprapubic area discomfort she has had no vaginal discharge, no nausea vomiting, hematuria, dysuria  USPSTF grade A and B recommendations - reviewed and addressed today  Depression:  Phq 9 completed today by patient, was reviewed by me with patient in the room PHQ score is neg, pt feels good PHQ 2/9 Scores 05/30/2020 04/28/2019 03/25/2019 02/17/2019  PHQ - 2 Score 0 0 0 2  PHQ- 9 Score 1 0 0 6  Exception Documentation - - - -   Depression screen [facility] 2/9 05/30/2020 04/28/2019 03/25/2019 02/17/2019 02/03/2019  Decreased Interest 0 0 0 1 3  Down, Depressed, Hopeless 0 0 0 1 3  PHQ - 2 Score 0 0 0 2 6  Altered sleeping 0 0 0 1 2  Tired, decreased energy 1 0 0 1 3  Change in appetite 0 0 0 1 3  Feeling bad or failure about yourself  0 0 0 1 1  Trouble concentrating 0 0 0 0 0  Moving slowly or fidgety/restless 0 0 0 0 -  Suicidal thoughts 0 0 0 0 0  PHQ-9 Score 1 0 0 6 15  Difficult doing work/chores Not difficult at all Not difficult at all - - Somewhat difficult    Alcohol screening:   Office Visit from 04/28/2019 in  [facility]  AUDIT-C Score 0      Immunizations and Health Maintenance: Health Maintenance  Topic Date Due  . COVID-19 Vaccine (1) Never done  . PAP-Cervical Cytology Screening  11/06/2019  . PAP SMEAR-Modifier  11/06/2019  . INFLUENZA VACCINE  07/03/2020  . TETANUS/TDAP  10/23/2023  . Hepatitis C Screening  Completed  . HIV Screening  Completed     Hep C Screening: done  STD testing and prevention (HIV/chl/gon/syphilis):  see above, no additional testing desired by pt today - HIV done in the past - does ask for STD screening, unprotected sex with female partner last month   Intimate partner violence:  denies  Sexual History/Pain during Intercourse: Single No problems with sex. No pain   Menstrual History/LMP/Abnormal Bleeding:  PCOS dx, on yaz Patient's last menstrual period was 05/16/2020.  Incontinence Symptoms:   MS lesions some urinary sx, urinary frequency, no retention or incontinence   Breast cancer:  Last Mammogram: *see HM list above BRCA gene screening:   None   Cervical cancer screening:   Genital  1: PATH Cytology PAP     Collected: 08/28/2019 2:51 PM      Hx of abnormal cell in  the past  Pt denies family hx of cancers - breast, ovarian, uterine - moms sister had colon CA (56)  Osteoporosis:   Discussion on osteoporosis per age, including high calcium and vitamin D supplementation, weight bearing exercises Pt is not supplementing with daily calcium/Vit D.   Skin cancer:  Hx of skin CA -  NO Discussed atypical lesions   Colorectal cancer:   Colonoscopy - N/I for age   Discussed concerning signs and sx of CRC, pt denies melena, hematochezia  Lung cancer:  n/a Low Dose CT Chest recommended if Age 30-80 years, 30 pack-year currently smoking OR have quit w/in 15years. Patient does not qualify.    Social History   Tobacco Use  . Smoking status: Never Smoker  . Smokeless tobacco: Never Used  Vaping Use  . Vaping Use: Never  used  Substance Use Topics  . Alcohol use: Yes    Comment: occasionally  . Drug use: Never       [facility] Visit [facility] 04/28/2019 in [facility]  AUDIT-C Score 0      Family History  Problem Relation Age of Onset  . Diabetes Father   . Hypertension Father   . Stroke Father   . Cancer Mother        lung  . Hyperlipidemia Mother   . Lung disease Mother   . Eczema Brother   . Breast cancer Neg Hx   . Ovarian cancer Neg Hx   . Colon cancer Neg Hx      Blood pressure/Hypertension: BP Readings from Last 3 Encounters:  05/30/20 110/76  02/03/20 119/75  08/28/19 103/74    Weight/Obesity: Wt Readings from Last 3 Encounters:  05/30/20 217 lb 3.2 oz (98.5 kg)  02/03/20 225 lb (102.1 kg)  08/28/19 232 lb 14.4 oz (105.6 kg)   BMI Readings from Last 3 Encounters:  05/30/20 39.09 kg/m  02/03/20 39.86 kg/m  08/28/19 42.60 kg/m     Lipids:  Lab Results  Component Value Date   CHOL 177 04/28/2019   CHOL 205 (H) 02/03/2019   CHOL 200 (H) 04/24/2018   Lab Results  Component Value Date   HDL 44 (L) 04/28/2019   HDL 44 02/03/2019   HDL 49 (L) 04/24/2018   Lab Results  Component Value Date   LDLCALC 113 (H) 04/28/2019   LDLCALC 136 (H) 02/03/2019   LDLCALC 131 (H) 04/24/2018   Lab Results  Component Value Date   TRIG 98 04/28/2019   TRIG 125 02/03/2019   TRIG 97 04/24/2018   Lab Results  Component Value Date   CHOLHDL 4.0 04/28/2019   CHOLHDL 4.1 04/24/2018   No results found for: LDLDIRECT Based on the results of lipid panel his/her cardiovascular risk factor ( using [place] )  in the next 10 years is: The ASCVD Risk score Mikey Bussing DC Jr., et al., 2013) failed to calculate for the following reasons:   The 2013 ASCVD risk score is only valid for ages 24 to 9 Glucose:  Glucose  Date Value Ref Range Status  02/03/2019 86 65 - 99 mg/dL Final  11/14/2015 73 65 - 99 mg/dL Final   Glucose, Bld  Date Value Ref Range Status  04/28/2019 82  65 - 99 mg/dL Final    Comment:    .            Fasting reference interval .   04/24/2018 87 65 - 139 mg/dL Final    Comment:    .  Non-fasting reference interval .   03/31/2015 94 70 - 99 mg/dL Final   Hypertension: BP Readings from Last 3 Encounters:  05/30/20 110/76  02/03/20 119/75  08/28/19 103/74   Obesity: Wt Readings from Last 3 Encounters:  05/30/20 217 lb 3.2 oz (98.5 kg)  02/03/20 225 lb (102.1 kg)  08/28/19 232 lb 14.4 oz (105.6 kg)   BMI Readings from Last 3 Encounters:  05/30/20 39.09 kg/m  02/03/20 39.86 kg/m  08/28/19 42.60 kg/m      Advanced Care Planning:  A voluntary discussion about advance care planning including the explanation and discussion of advance directives.   Discussed health care proxy and Living will, and the patient was able to identify a health care proxy as mom, Marcellus Scott.   Patient does not have a living will at present time.   Social History      She        Social History   Socioeconomic History  . Marital status: Single    Spouse name: Not on file  . Number of children: Not on file  . Years of education: 8  . Highest education level: Master's degree (e.g., MA, MS, MEng, MEd, MSW, MBA)  Occupational History  . Occupation: Holiday representative  Tobacco Use  . Smoking status: Never Smoker  . Smokeless tobacco: Never Used  Vaping Use  . Vaping Use: Never used  Substance and Sexual Activity  . Alcohol use: Yes    Comment: occasionally  . Drug use: Never  . Sexual activity: Yes    Partners: Male    Birth control/protection: Other-see comments, Pill    Comment: partner has vasectomy  Other Topics Concern  . Not on file  Social History Narrative   Lives with mother    Drinks soda daily (8-10 ounces)   Social Determinants of Health   Financial Resource Strain:   . Difficulty of Paying Living Expenses:   Food Insecurity:   . Worried About Charity fundraiser in the Last Year:   . Arboriculturist in the  Last Year:   Transportation Needs:   . Film/video editor (Medical):   Marland Kitchen Lack of Transportation (Non-Medical):   Physical Activity:   . Days of Exercise per Week:   . Minutes of Exercise per Session:   Stress:   . Feeling of Stress :   Social Connections:   . Frequency of Communication with Friends and Family:   . Frequency of Social Gatherings with Friends and Family:   . Attends Religious Services:   . Active Member of Clubs or Organizations:   . Attends Archivist Meetings:   Marland Kitchen Marital Status:     Family History        Family History  Problem Relation Age of Onset  . Diabetes Father   . Hypertension Father   . Stroke Father   . Cancer Mother        lung  . Hyperlipidemia Mother   . Lung disease Mother   . Eczema Brother   . Breast cancer Neg Hx   . Ovarian cancer Neg Hx   . Colon cancer Neg Hx     Patient Active Problem List   Diagnosis Date Noted  . Nocturia 08/05/2019  . Insulin resistance 02/18/2019  . Screen for STD (sexually transmitted disease) 03/01/2017  . High risk medication use 01/24/2017  . Other headache syndrome 07/24/2016  . Obesity 11/19/2015  . Verruca vulgaris 06/08/2015  . Multiple sclerosis (  [place]) 04/04/2015  . Numbness 04/04/2015  . Vitamin D deficiency 04/04/2015  . MS (multiple sclerosis) ([place]) 03/04/2015    History reviewed. No pertinent surgical history.   Current Outpatient Medications:  .  Cholecalciferol (VITAMIN D3) LIQD, 2,000 Units by Does not apply route., Disp: , Rfl:  .  clindamycin (CLEOCIN T) 1 % lotion, Apply topically 2 (two) times daily., Disp: 60 mL, Rfl: 0 .  drospirenone-ethinyl estradiol (YAZ) 3-0.02 MG tablet, Take 1 tablet by mouth daily., Disp: 1 Package, Rfl: 11 .  LORazepam (ATIVAN) 0.5 MG tablet, Take 1 tablet (0.5 mg total) by mouth 2 (two) times daily as needed for anxiety., Disp: 15 tablet, Rfl: 1 .  natalizumab (TYSABRI) 300 MG/15ML injection, Inject 15 mLs into the vein every 30 (thirty)  days. , Disp: , Rfl:  .  VANADIUM PO, Take 2 mg by mouth daily. With Chormium, Disp: , Rfl:  No current facility-administered medications for this visit.  Facility-Administered Medications Ordered in Other Visits:  .  gadopentetate dimeglumine (MAGNEVIST) injection 18 mL, 18 mL, Intravenous, Once PRN, Sater, Nanine Means, MD  No Known Allergies  Patient Care Team: Delsa Grana, PA-C as PCP - General (Family Medicine) Sater, Nanine Means, MD (Neurology) Verdene Rio, Lara Mulch, CNM as Midwife (Certified Nurse Midwife)  Review of Systems  Constitutional: Negative.  Negative for activity change, appetite change, fatigue and unexpected weight change.  HENT: Negative.   Eyes: Negative.   Respiratory: Negative.  Negative for shortness of breath.   Cardiovascular: Negative.  Negative for chest pain, palpitations and leg swelling.  Gastrointestinal: Negative.  Negative for abdominal pain and blood in stool.  Endocrine: Negative.   Genitourinary: Negative.   Musculoskeletal: Negative.  Negative for arthralgias, gait problem, joint swelling and myalgias.  Skin: Negative.  Negative for color change, pallor and rash.  Allergic/Immunologic: Negative.   Neurological: Negative.  Negative for syncope and weakness.  Hematological: Negative.   Psychiatric/Behavioral: Negative.  Negative for confusion, dysphoric mood, self-injury and suicidal ideas. The patient is not nervous/anxious.      I personally reviewed active problem list, medication list, allergies, family history, social history, health maintenance, notes from last encounter, lab results, imaging with the patient/caregiver today.        Objective:   Vitals:  Vitals:   05/30/20 1405  BP: 110/76  Pulse: 72  Resp: 16  Temp: 97.9 F (36.6 C)  TempSrc: Temporal  SpO2: 98%  Weight: 217 lb 3.2 oz (98.5 kg)  Height: 5' 2.5" (1.588 m)    Body mass index is 39.09 kg/m.  Physical Exam Vitals and nursing note reviewed.    Constitutional:      General: She is not in acute distress.    Appearance: Normal appearance. She is well-developed. She is not toxic-appearing or diaphoretic.  HENT:     Head: Normocephalic and atraumatic.     Right Ear: External ear normal.     Left Ear: External ear normal.     Nose: Nose normal.     Mouth/Throat:     Pharynx: Uvula midline.  Eyes:     General: Lids are normal.     Conjunctiva/sclera: Conjunctivae normal.     Pupils: Pupils are equal, round, and reactive to light.  Neck:     Thyroid: No thyroid mass, thyromegaly or thyroid tenderness.     Trachea: Phonation normal. No tracheal deviation.  Cardiovascular:     Rate and Rhythm: Normal rate and regular rhythm.     Pulses: Normal pulses.  Radial pulses are 2+ on the right side and 2+ on the left side.       Posterior tibial pulses are 2+ on the right side and 2+ on the left side.     Heart sounds: Normal heart sounds. No murmur heard.  No friction rub. No gallop.   Pulmonary:     Effort: Pulmonary effort is normal. No respiratory distress.     Breath sounds: Normal breath sounds. No stridor. No wheezing, rhonchi or rales.  Chest:     Chest wall: No tenderness.  Abdominal:     General: Bowel sounds are normal. There is no distension.     Palpations: Abdomen is soft.     Tenderness: There is no abdominal tenderness. There is no right CVA tenderness, left CVA tenderness, guarding or rebound.  Musculoskeletal:        General: No deformity. Normal range of motion.     Cervical back: Normal range of motion and neck supple.  Lymphadenopathy:     Cervical: No cervical adenopathy.  Skin:    General: Skin is warm and dry.     Capillary Refill: Capillary refill takes less than 2 seconds.     Coloration: Skin is not pale.     Findings: No rash.  Neurological:     Mental Status: She is alert and oriented to person, place, and time.     Motor: No abnormal muscle tone.     Gait: Gait normal.  Psychiatric:         Mood and Affect: Mood normal.        Speech: Speech normal.        Behavior: Behavior normal.     POCT urine pregnancy  Result Value Ref Range   Preg Test, Ur Negative Negative  POCT urinalysis dipstick  Result Value Ref Range   Color, UA Dark yellow    Clarity, UA Slightly Cloudy    Glucose, UA Negative Negative   Bilirubin, UA Negative    Ketones, UA 15    Spec Grav, UA >=1.030 (A) 1.010 - 1.025   Blood, UA Small    pH, UA 6.0 5.0 - 8.0   Protein, UA Positive (A) Negative   Urobilinogen, UA 0.2 0.2 or 1.0 E.U./dL   Nitrite, UA Negative    Leukocytes, UA Negative Negative   Appearance Normal    Odor Normal      Fall Risk: Fall Risk  05/30/2020 04/28/2019 06/17/2018 04/24/2018 08/23/2017  Falls in the past year? 0 0 No No No  Number falls in past yr: 0 - - - -  Injury with Fall? 0 - - - -    Functional Status Survey: Is the patient deaf or have difficulty hearing?: No Does the patient have difficulty seeing, even when wearing glasses/contacts?: No Does the patient have difficulty concentrating, remembering, or making decisions?: No Does the patient have difficulty walking or climbing stairs?: No Does the patient have difficulty dressing or bathing?: No Does the patient have difficulty doing errands alone such as visiting a doctor's office or shopping?: No   Assessment & Plan:    CPE completed today  . USPSTF grade A and B recommendations reviewed with patient; age-appropriate recommendations, preventive care, screening tests, etc discussed and encouraged; healthy living encouraged; see AVS for patient education given to patient  . Discussed importance of 150 minutes of physical activity weekly, AHA exercise recommendations given to pt in AVS/handout  . Discussed importance of healthy diet:  eating lean meats  and proteins, avoiding trans fats and saturated fats, avoid simple sugars and excessive carbs in diet, eat 6 servings of fruit/vegetables daily and drink  plenty of water and avoid sweet beverages.    . Recommended pt to do annual eye exam and routine dental exams/cleanings  . Depression, alcohol, fall screening completed as documented above and per flowsheets  . Reviewed Health Maintenance: Health Maintenance  Topic Date Due  . COVID-19 Vaccine (1) Never done  . PAP-Cervical Cytology Screening  11/06/2019  . PAP SMEAR-Modifier  11/06/2019  . INFLUENZA VACCINE  07/03/2020  . TETANUS/TDAP  10/23/2023  . Hepatitis C Screening  Completed  . HIV Screening  Completed    . Immunizations: Immunization History  Administered Date(s) Administered  . DTP 09/15/1991, 12/03/1991, 01/19/1992  . DTaP 06/05/1996  . Hepatitis B, adult 06/05/1996  . HiB (PRP-OMP) 09/15/1991, 12/03/1991, 01/09/1992  . Influenza-Unspecified 10/22/2014, 09/13/2016  . MMR 06/05/1996  . OPV 09/15/1991, 12/03/1991, 06/05/1996  . Tdap 10/22/2013      ICD-10-CM   1. Preventative health care  Z00.00 RPR    HIV antibody (with reflex)    Cervicovaginal ancillary only    Lipid panel    Hemoglobin A1C    COMPLETE METABOLIC PANEL WITH GFR    CBC w/Diff/Platelet  2. Other hyperlipidemia  E78.49 Lipid panel    Hemoglobin A1C   hx of elevated cholesterol, discussed diet efforts, food choices, exercise, and risk calculation  3. Insulin resistance  E88.81 Hemoglobin A1C    COMPLETE METABOLIC PANEL WITH GFR   encouraged low carb/low glycemic diet  4. Pelvic pain  R10.2 POCT urine pregnancy    POCT urinalysis dipstick    Urine Culture   Vague discomfort, she would like be tested for STDs, no tenderness to palpation on exam  5. Recurrent UTI  N39.0 POCT urinalysis dipstick    Urine Culture   Possibly neurogenic or related to her MS, will screen her urine and sent for culture, treat if culture is positive  6. Screening examination for STD (sexually transmitted disease)  Z11.3 RPR    HIV antibody (with reflex)    Cervicovaginal ancillary only    POCT urine pregnancy    std testing today, she declines pelvic exam  7. PCOS (polycystic ovarian syndrome)  E28.2    managed with birth control, no current concerns   Urine dip was not concerning for UTI did have darker color, some ketones and protein more suggestive of fasting and dehydration -  She did have some diarrhea over the last day after eating at a restaurant, her stool has already started to improve with less frequency, she may be a little bit dry due to some fluid loss with this past 1 to 2 days of diarrhea    Delsa Grana, PA-C 05/30/20 2:37 PM  [facility]

## 2020-05-30 NOTE — Patient Instructions (Signed)
Preventive Care 21-29 Years Old, Female Preventive care refers to visits with your health care provider and lifestyle choices that can promote health and wellness. This includes:  A yearly physical exam. This may also be called an annual well check.  Regular dental visits and eye exams.  Immunizations.  Screening for certain conditions.  Healthy lifestyle choices, such as eating a healthy diet, getting regular exercise, not using drugs or products that contain nicotine and tobacco, and limiting alcohol use. What can I expect for my preventive care visit? Physical exam Your health care provider will check your:  Height and weight. This may be used to calculate body mass index (BMI), which tells if you are at a healthy weight.  Heart rate and blood pressure.  Skin for abnormal spots. Counseling Your health care provider may ask you questions about your:  Alcohol, tobacco, and drug use.  Emotional well-being.  Home and relationship well-being.  Sexual activity.  Eating habits.  Work and work environment.  Method of birth control.  Menstrual cycle.  Pregnancy history. What immunizations do I need?  Influenza (flu) vaccine  This is recommended every year. Tetanus, diphtheria, and pertussis (Tdap) vaccine  You may need a Td booster every 10 years. Varicella (chickenpox) vaccine  You may need this if you have not been vaccinated. Human papillomavirus (HPV) vaccine  If recommended by your health care provider, you may need three doses over 6 months. Measles, mumps, and rubella (MMR) vaccine  You may need at least one dose of MMR. You may also need a second dose. Meningococcal conjugate (MenACWY) vaccine  One dose is recommended if you are age 19-21 years and a first-year college student living in a residence hall, or if you have one of several medical conditions. You may also need additional booster doses. Pneumococcal conjugate (PCV13) vaccine  You may need  this if you have certain conditions and were not previously vaccinated. Pneumococcal polysaccharide (PPSV23) vaccine  You may need one or two doses if you smoke cigarettes or if you have certain conditions. Hepatitis A vaccine  You may need this if you have certain conditions or if you travel or work in places where you may be exposed to hepatitis A. Hepatitis B vaccine  You may need this if you have certain conditions or if you travel or work in places where you may be exposed to hepatitis B. Haemophilus influenzae type b (Hib) vaccine  You may need this if you have certain conditions. You may receive vaccines as individual doses or as more than one vaccine together in one shot (combination vaccines). Talk with your health care provider about the risks and benefits of combination vaccines. What tests do I need?  Blood tests  Lipid and cholesterol levels. These may be checked every 5 years starting at age 20.  Hepatitis C test.  Hepatitis B test. Screening  Diabetes screening. This is done by checking your blood sugar (glucose) after you have not eaten for a while (fasting).  Sexually transmitted disease (STD) testing.  BRCA-related cancer screening. This may be done if you have a family history of breast, ovarian, tubal, or peritoneal cancers.  Pelvic exam and Pap test. This may be done every 3 years starting at age 21. Starting at age 30, this may be done every 5 years if you have a Pap test in combination with an HPV test. Talk with your health care provider about your test results, treatment options, and if necessary, the need for more tests.   Follow these instructions at home: Eating and drinking   Eat a diet that includes fresh fruits and vegetables, whole grains, lean protein, and low-fat dairy.  Take vitamin and mineral supplements as recommended by your health care provider.  Do not drink alcohol if: ? Your health care provider tells you not to drink. ? You are  pregnant, may be pregnant, or are planning to become pregnant.  If you drink alcohol: ? Limit how much you have to 0-1 drink a day. ? Be aware of how much alcohol is in your drink. In the [place] one drink equals one 12 oz bottle of beer (355 mL), one 5 oz glass of wine (148 mL), or one 1 oz glass of hard liquor (44 mL). Lifestyle  Take daily care of your teeth and gums.  Stay active. Exercise for at least 30 minutes on 5 or more days each week.  Do not use any products that contain nicotine or tobacco, such as cigarettes, e-cigarettes, and chewing tobacco. If you need help quitting, ask your health care provider.  If you are sexually active, practice safe sex. Use a condom or other form of birth control (contraception) in order to prevent pregnancy and STIs (sexually transmitted infections). If you plan to become pregnant, see your health care provider for a preconception visit. What's next?  Visit your health care provider once a year for a well check visit.  Ask your health care provider how often you should have your eyes and teeth checked.  Stay up to date on all vaccines. This information is not intended to replace advice given to you by your health care provider. Make sure you discuss any questions you have with your health care provider. Document Revised: 07/31/2018 Document Reviewed: 07/31/2018 Elsevier Patient Education  2020 [employer].

## 2020-05-31 ENCOUNTER — Encounter: Payer: Self-pay | Admitting: Family Medicine

## 2020-05-31 LAB — LIPID PANEL
Cholesterol: 193 mg/dL (ref <200)
HDL: 55 mg/dL (ref >=50)
LDL Cholesterol (Calc): 112 mg/dL (calc) — ABNORMAL HIGH
Non-HDL Cholesterol (Calc): 138 mg/dL (calc) — ABNORMAL HIGH (ref <130)
Total CHOL/HDL Ratio: 3.5 (calc) (ref <5.0)
Triglycerides: 148 mg/dL (ref <150)

## 2020-05-31 LAB — COMPLETE METABOLIC PANEL WITH GFR
AG Ratio: 1.6 (calc) (ref 1.0–2.5)
ALT: 20 U/L (ref 6–29)
AST: 18 U/L (ref 10–30)
Albumin: 4.4 g/dL (ref 3.6–5.1)
Alkaline phosphatase (APISO): 46 U/L (ref 31–125)
BUN: 9 mg/dL (ref 7–25)
CO2: 26 mmol/L (ref 20–32)
Calcium: 9.3 mg/dL (ref 8.6–10.2)
Chloride: 104 mmol/L (ref 98–110)
Creat: 0.72 mg/dL (ref 0.50–1.10)
GFR, Est African American: 132 mL/min/{1.73_m2} (ref >=60)
GFR, Est Non African American: 114 mL/min/{1.73_m2} (ref >=60)
Globulin: 2.7 g/dL (calc) (ref 1.9–3.7)
Glucose, Bld: 84 mg/dL (ref 65–99)
Potassium: 3.8 mmol/L (ref 3.5–5.3)
Sodium: 139 mmol/L (ref 135–146)
Total Bilirubin: 0.5 mg/dL (ref 0.2–1.2)
Total Protein: 7.1 g/dL (ref 6.1–8.1)

## 2020-05-31 LAB — URINE CULTURE
MICRO NUMBER:: 10642995
SPECIMEN QUALITY:: ADEQUATE

## 2020-05-31 LAB — HEMOGLOBIN A1C
Hgb A1c MFr Bld: 5.4 % of total Hgb (ref <5.7)
Mean Plasma Glucose: 108 (calc)
eAG (mmol/L): 6 (calc)

## 2020-05-31 LAB — CBC WITH DIFFERENTIAL/PLATELET
Absolute Monocytes: 564 cells/uL (ref 200–950)
Basophils Absolute: 58 cells/uL (ref 0–200)
Basophils Relative: 0.5 %
Eosinophils Absolute: 219 cells/uL (ref 15–500)
Eosinophils Relative: 1.9 %
HCT: 41.4 % (ref 35.0–45.0)
Hemoglobin: 13.7 g/dL (ref 11.7–15.5)
Lymphs Abs: 5313 cells/uL — ABNORMAL HIGH (ref 850–3900)
MCH: 30 pg (ref 27.0–33.0)
MCHC: 33.1 g/dL (ref 32.0–36.0)
MCV: 90.6 fL (ref 80.0–100.0)
MPV: 10.4 fL (ref 7.5–12.5)
Monocytes Relative: 4.9 %
Neutro Abs: 5348 cells/uL (ref 1500–7800)
Neutrophils Relative %: 46.5 %
Platelets: 223 10*3/uL (ref 140–400)
RBC: 4.57 10*6/uL (ref 3.80–5.10)
RDW: 12.7 % (ref 11.0–15.0)
Total Lymphocyte: 46.2 %
WBC: 11.5 10*3/uL — ABNORMAL HIGH (ref 3.8–10.8)

## 2020-05-31 LAB — RPR: RPR Ser Ql: NONREACTIVE

## 2020-05-31 LAB — HIV ANTIBODY (ROUTINE TESTING W REFLEX): HIV 1&2 Ab, 4th Generation: NONREACTIVE

## 2020-06-01 LAB — CERVICOVAGINAL ANCILLARY ONLY
Bacterial Vaginitis (gardnerella): NEGATIVE
Candida Glabrata: NEGATIVE
Candida Vaginitis: NEGATIVE
Chlamydia: NEGATIVE
Comment: NEGATIVE
Comment: NEGATIVE
Comment: NEGATIVE
Comment: NEGATIVE
Comment: NEGATIVE
Comment: NORMAL
Neisseria Gonorrhea: NEGATIVE
Trichomonas: NEGATIVE

## 2020-06-02 DIAGNOSIS — N390 Urinary tract infection, site not specified: Secondary | ICD-10-CM | POA: Insufficient documentation

## 2020-06-07 DIAGNOSIS — G35 Multiple sclerosis: Secondary | ICD-10-CM | POA: Diagnosis not present

## 2020-06-14 ENCOUNTER — Other Ambulatory Visit: Payer: Self-pay | Admitting: Certified Nurse Midwife

## 2020-07-11 DIAGNOSIS — G35 Multiple sclerosis: Secondary | ICD-10-CM | POA: Diagnosis not present

## 2020-08-09 ENCOUNTER — Ambulatory Visit: Payer: 59 | Admitting: Family Medicine

## 2020-08-09 ENCOUNTER — Encounter: Payer: Self-pay | Admitting: Family Medicine

## 2020-08-09 ENCOUNTER — Other Ambulatory Visit: Payer: Self-pay

## 2020-08-09 VITALS — BP 116/72 | HR 75 | Ht 62.5 in | Wt 233.4 lb

## 2020-08-09 DIAGNOSIS — E559 Vitamin D deficiency, unspecified: Secondary | ICD-10-CM | POA: Diagnosis not present

## 2020-08-09 DIAGNOSIS — G35 Multiple sclerosis: Secondary | ICD-10-CM

## 2020-08-09 DIAGNOSIS — Z79899 Other long term (current) drug therapy: Secondary | ICD-10-CM | POA: Diagnosis not present

## 2020-08-09 DIAGNOSIS — F418 Other specified anxiety disorders: Secondary | ICD-10-CM

## 2020-08-09 DIAGNOSIS — R208 Other disturbances of skin sensation: Secondary | ICD-10-CM

## 2020-08-09 NOTE — Progress Notes (Signed)
I have read the note, and I agree with the clinical assessment and plan.  Cybill Uriegas A. Tennis Mckinnon, MD, PhD, FAAN Certified in Neurology, Clinical Neurophysiology, Sleep Medicine, Pain Medicine and Neuroimaging  [facility] [address], [address] [place], [place] [address] [phone]  

## 2020-08-09 NOTE — Progress Notes (Signed)
PATIENT: Tracey Lindsey DOB: May 23, 1991  REASON FOR VISIT: follow up HISTORY FROM: patient  Chief Complaint  Patient presents with  . Follow-up    6 month f/u for MS. States she has noticed some increased numbness and tingling in her legs and lower back.   . room 4    alone     HISTORY OF PRESENT ILLNESS: Today 08/09/20 Tracey Lindsey is a 29 y.o. female here today for follow up for RRMS. She continues Tysabri. Labs were stable in 02/2020. JCV negative. MR brain and cervical spine unchanged from imaging in 2017.   She feels she is doing well, overall. No new or worsening numbness or weakness. No vision or gait changes. Bowel and bladder habits stable.   Mood is fairly stable. She does admit that she is having more anxiety about being diagnosed with MS. She doesn't want to have to take medication for the rest of her life. She has been been taking Ativan 0.5mg  more frequently than she used to. She reports 1-2 tablets 1-2 times a month. When she takes Ativan, she feels really sleepy. She feels that she is sleeping well.   B12 was low normal. She was advised to start OTC supplements. She has not started these. She does have dysesthesias in her legs, not new or worsening. Vitamin D was normal. She continues OTC vitamin D at 2,000iu daily. She is also taking Vanadium and chromium supplements for    HISTORY: (copied from Dr Bonnita Hollow note on 02/03/2020)  Tracey Lindsey is a 29 y.o. woman recently diagnosed with MS.     Update 02/03/2020: She is on Tysabri and feels her MS is stable.   No recent exacerbation and no new symptoms.  Her last MRI showed no new MS lesions.    Gait is doing well.   Balance is doing well.    She denies weakness.   She notes some numbness in her legs when sitting on a toilet but left foot numbness resolved..  Bladder function is the same with some frequency but no incontinence.  Marland Kitchen  She wakes up 2-3 times nightly to urinate.      Vision is the same --- right eye blurry  but left and binocular is 20/20.    She notes some fatigue that worsens as day goes on.    She gets everything done during the day that she needs to do.   She sleeps well and quickly falls back to sleep after nocturia.  She denies depression but has mild anxiety.   Cognition is doing well.    We discussed the vaccination and I recommended that she gets it.   She works in Engineer, site.    Update 08/05/2019: She is on Tysabri and denies any exacerbation.   Tolerates it well.   She is JCV Ab negative (last checked 02/2019).  MRI of the brain 2019 showed no new lesions.  Her gait is doing well.   She occasional feels slightly off balanced if standing still but no problems climbing stairs.     No weakness or numbness.   Bladder function is ok though she has nocturia x 2..   Vision is fine.  She notes some fatigue.   She is sleeping well (falls back asleep easily most nights after nocturia).    She is on OCP for PCOS.   She feels a little more anxious but not depressed.    She feels she is a bit more reserved due  to mild anxiety but ist does not prevent her from doing what she needs to do.   Cognition is fine.  Occasionally, she has some short term memory issue but does ok with a hint.    She started Vit D 50000 units weekly as she prefers that to OTC daily.    Update 02/02/2019: She has done well on Tysabri.   MRI 08/2018 showed no new lesions.   She tolerates it well and has no exacerbation.  Last JCV Ab is 0.23 (negative inhib assay).   Gait is good.   She can climb stairs and ladders and no recent falls.   Bladder function is good.   She has nocturia 1-2 times but no incontinence.    Vision is doing well.    She likely had ON in 2014.     She has fatigue.   She sleeps well most nights.   She is planning on joining a weight management programs.  She has mild depression and some anxiety but only neds lorazepam once a week.   No recent panic attacks.  Cognition is good.   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        Update 07/31/2018: She is on Tysabri and she tolerates it well.    She denies any new MS symptoms and no exacerbation.    She is walking well and she can climb stairs and a ladder.     She denies weakness or spasticity    Her fingers feel tigh if she does a lot of typing.    Bladder function is ok with mild frequency.     Vision is doing well.  She has some fatigue.   She sleeps well most nights.    She denies depression but notes some anxiety.   She takes lorazepam prn.   No recent panic attacks.   She deneis any cognitive issues.    Her last MRis were from 10/2016 and showed no new lesions.  She was diagnosed with PCOS and placed on hormone therapy.  Update 01/30/2018: Her MS has done very well on Tysabri. She has not had any exacerbations. She tolerates the medication well. Her JCV antibody was last tested 07/24/2017. It was negative at 0.11 at that time..       She has urinary frequency but no incontinence.    Vision is fine.     She has some fatigue but is able to do everthing that she wants to do.    She denies depression but notes generalized anxiety.   There are no panic attacks.   She takes lorazepam just 2 x a month on average.    She has some insomnia, also has some nocturia, twice most nights. She denies any difficulty with her cognition.  Her headaches are doing better.   She took imipramine.   HA improved but not sure if due to imipramine..   Imipramine helped her sleep and nocturia. She stopped after a few weeks.      From 07/24/2017:  MS:  She is on Tysabri and tolerates it well.  She is JCV Ab negative (0.08  on 01/24/2017)   She denies any new exacerbation.   Cervical spine and brain November 2017 was personally reviewed. There were no new lesions. The previously enhancing lesions were smaller and no longer enhanced.  Headaches:   She is still getting frequent headaches. (>25/30 a month).   Pain is bilateral and is usually mild to moderate.   It builds up  daily at work.   HA is sometimes present when she wakes up though.  She gets a pressure sensation.  HA is usually milder the next morning.   It only seems to occur when she tries to go to sleep.  No nausea or vomiting.   Moving does not alter the pain much.   She denies occipital pain.    She notes pain in the back of the head/upper neck as well.   She does not take anything for the headache.  She is not sure if imipramine helped any when she tried in the past.  Gait/strength/sensation:   Her gait is back to baseline. She denies any weakness or spasticity in the arms or legs. There is no numbness.   Bladder/bowel:  She has urinary frequency only at night.    She has 3-4 times nocturia many nights.   No incontinence.  No recent UTI's.   No hesitancy.   Bowel is fine  Vision/ears:  Diplopia resolved. She denies any history of optic neuritis. She does not have any visual acuity issues or problems with color vision..  Fatigue/sleep:   She reports mild fatigue. This is worse with heat. She is working full-time.      She wakes up some at night but usually falls back asleep.   he is sleeping worse with frequent awakenings for nocturia.     Imipramine helped but caused a hangover.     Mood/Cognition:   She notes some anxiety but no depression.   She denies any significant cognitive change.   MS History:   In retrospect, she had an episode of vertigo in 2014 with mild vision changes that improved.    In March 2016, she had vertigo that improved after a couple weeks.  In late April, she had  numbness in the left foot. When she woke up the next morning, she noted that the numbness was higher up the left arm and the next day it also involved the left hand and she went to the emergency room. In the emergency room, she had an MRI of the brain that showed white matter foci were some for multiple sclerosis.   MRI of the brain with and without contrast 03/29/15 showed several posterior fossa lesions with one in the right middle  cerebellar peduncle and several in the pons.   The middle cerebellar peduncle focus and 2 of the pontine foci enhanced. There are also several periventricular and deep white matter foci in the cerebral hemispheres.   MRI cervical spine shows one enhancing focus.  Repeat imaging of the brain and cervical spine November 2017 did not show any new lesions. The previously enhancing lesions were smaller and did not enhance.Marland Kitchen    REVIEW OF SYSTEMS: Out of a complete 14 system review of symptoms, the patient complains only of the following symptoms, anxiety, numbness and tinglin and all other reviewed systems are negative.   ALLERGIES: No Known Allergies  HOME MEDICATIONS: Outpatient Medications Prior to Visit  Medication Sig Dispense Refill  . Cholecalciferol (VITAMIN D3) LIQD 2,000 Units by Does not apply route.    Marland Kitchen LORazepam (ATIVAN) 0.5 MG tablet Take 1 tablet (0.5 mg total) by mouth 2 (two) times daily as needed for anxiety. 15 tablet 1  . natalizumab (TYSABRI) 300 MG/15ML injection Inject 15 mLs into the vein every 30 (thirty) days.     Marland Kitchen NIKKI 3-0.02 MG tablet TAKE 1 TABLET BY MOUTH EVERY DAY 28 tablet 2  . VANADIUM PO Take  2 mg by mouth daily. With Chormium    . clindamycin (CLEOCIN T) 1 % lotion Apply topically 2 (two) times daily. 60 mL 0   Facility-Administered Medications Prior to Visit  Medication Dose Route Frequency Provider Last Rate Last Admin  . gadopentetate dimeglumine (MAGNEVIST) injection 18 mL  18 mL Intravenous Once PRN Sater, Pearletha Furl, MD        PAST MEDICAL HISTORY: Past Medical History:  Diagnosis Date  . Anxiety   . Chlamydia   . Depression   . History of abnormal cervical Papanicolaou smear   . MS (multiple sclerosis) [facility]) April 2016  . PCOS (polycystic ovarian syndrome)   . Vitamin D deficiency     PAST SURGICAL HISTORY: No past surgical history on file.  FAMILY HISTORY: Family History  Problem Relation Age of Onset  . Diabetes Father   .  Hypertension Father   . Stroke Father   . Cancer Mother        lung  . Hyperlipidemia Mother   . Lung disease Mother   . Eczema Brother   . Breast cancer Neg Hx   . Ovarian cancer Neg Hx   . Colon cancer Neg Hx     SOCIAL HISTORY: Social History   Socioeconomic History  . Marital status: Single    Spouse name: Not on file  . Number of children: Not on file  . Years of education: 33  . Highest education level: Master's degree (e.g., MA, MS, MEng, MEd, MSW, MBA)  Occupational History  . Occupation: Visual merchandiser  Tobacco Use  . Smoking status: Never Smoker  . Smokeless tobacco: Never Used  Vaping Use  . Vaping Use: Never used  Substance and Sexual Activity  . Alcohol use: Yes    Comment: occasionally  . Drug use: Never  . Sexual activity: Yes    Partners: Male    Birth control/protection: Other-see comments, Pill    Comment: partner has vasectomy  Other Topics Concern  . Not on file  Social History Narrative   Lives with mother    Drinks soda daily (8-10 ounces)   Social Determinants of Health   Financial Resource Strain:   . Difficulty of Paying Living Expenses: Not on file  Food Insecurity:   . Worried About Programme researcher, broadcasting/film/video in the Last Year: Not on file  . Ran Out of Food in the Last Year: Not on file  Transportation Needs:   . Lack of Transportation (Medical): Not on file  . Lack of Transportation (Non-Medical): Not on file  Physical Activity:   . Days of Exercise per Week: Not on file  . Minutes of Exercise per Session: Not on file  Stress:   . Feeling of Stress : Not on file  Social Connections:   . Frequency of Communication with Friends and Family: Not on file  . Frequency of Social Gatherings with Friends and Family: Not on file  . Attends Religious Services: Not on file  . Active Member of Clubs or Organizations: Not on file  . Attends Banker Meetings: Not on file  . Marital Status: Not on file  Intimate Partner  Violence:   . Fear of Current or Ex-Partner: Not on file  . Emotionally Abused: Not on file  . Physically Abused: Not on file  . Sexually Abused: Not on file      PHYSICAL EXAM  Vitals:   08/09/20 1359  BP: 116/72  Pulse: 75  Weight: 233 lb  6.4 oz (105.9 kg)  Height: 5' 2.5" (1.588 m)   Body mass index is 42.01 kg/m.  Generalized: Well developed, in no acute distress  Cardiology: normal rate and rhythm, no murmur noted Respiratory: clear to auscultation bilaterally  Neurological examination  Mentation: Alert oriented to time, place, history taking. Follows all commands speech and language fluent Cranial nerve II-XII: Pupils were equal round reactive to light. Extraocular movements were full, visual field were full on confrontational test. Facial sensation and strength were normal. Head turning and shoulder shrug  were normal and symmetric. Motor: The motor testing reveals 5 over 5 strength of all 4 extremities. Good symmetric motor tone is noted throughout.  Sensory: Sensory testing is intact to soft touch on all 4 extremities. No evidence of extinction is noted.  Coordination: Cerebellar testing reveals good finger-nose-finger and heel-to-shin bilaterally.  Gait and station: Gait is normal.  Reflexes: Deep tendon reflexes are symmetric and normal bilaterally.    DIAGNOSTIC DATA (LABS, IMAGING, TESTING) - I reviewed patient records, labs, notes, testing and imaging myself where available.  No flowsheet data found.   Lab Results  Component Value Date   WBC 11.5 (H) 05/30/2020   HGB 13.7 05/30/2020   HCT 41.4 05/30/2020   MCV 90.6 05/30/2020   PLT 223 05/30/2020      Component Value Date/Time   NA 139 05/30/2020 1512   NA 138 02/03/2019 0933   K 3.8 05/30/2020 1512   CL 104 05/30/2020 1512   CO2 26 05/30/2020 1512   GLUCOSE 84 05/30/2020 1512   BUN 9 05/30/2020 1512   BUN 8 02/03/2019 0933   CREATININE 0.72 05/30/2020 1512   CALCIUM 9.3 05/30/2020 1512   PROT  7.1 05/30/2020 1512   PROT 6.7 02/03/2019 0933   ALBUMIN 4.4 02/03/2019 0933   AST 18 05/30/2020 1512   ALT 20 05/30/2020 1512   ALKPHOS 45 02/03/2019 0933   BILITOT 0.5 05/30/2020 1512   BILITOT 0.3 02/03/2019 0933   GFRNONAA 114 05/30/2020 1512   GFRAA 132 05/30/2020 1512   Lab Results  Component Value Date   CHOL 193 05/30/2020   HDL 55 05/30/2020   LDLCALC 112 (H) 05/30/2020   TRIG 148 05/30/2020   CHOLHDL 3.5 05/30/2020   Lab Results  Component Value Date   HGBA1C 5.4 05/30/2020   Lab Results  Component Value Date   VITAMINB12 258 02/03/2020   Lab Results  Component Value Date   TSH 1.630 07/14/2019       ASSESSMENT AND PLAN 29 y.o. year old female  has a past medical history of Anxiety, Chlamydia, Depression, History of abnormal cervical Papanicolaou smear, MS (multiple sclerosis) (HCC) (April 2016), PCOS (polycystic ovarian syndrome), and Vitamin D deficiency. here with     ICD-10-CM   1. Multiple sclerosis (HCC)  G35 CBC with Differential/Platelets    CMP    Stratify JCV Ab (w/ Index) w/ Rflx  2. High risk medication use  Z79.899 CBC with Differential/Platelets    CMP    Stratify JCV Ab (w/ Index) w/ Rflx  3. Vitamin D deficiency  E55.9 Vitamin D, 25-hydroxy  4. Depression with anxiety  F41.8   5. Dysesthesia affecting both sides of body  R20.8     Baleigh is doing very well, today. She is due for her next Tysabri infusion this week. We will continue current treatment plan. I will update labs. MRI stable in 02/2020. We will monitor concerns of anxiety closely. She may continue Ativan sparingly. She  last refilled 8 tablets in 08/2019. I have advised that she start B12 supplements OTC. This may help with dysesthesias. She will focus on healthy lifestyle habits. We will follow up in 6 months.    Orders Placed This Encounter  Procedures  . CBC with Differential/Platelets  . CMP  . Stratify JCV Ab (w/ Index) w/ Rflx  . Vitamin D, 25-hydroxy     No orders  of the defined types were placed in this encounter.     I spent 30 minutes with the patient. 50% of this time was spent counseling and educating patient on plan of care and medications.    Shawnie Dapper, FNP-C 08/09/2020, 2:37 PM [facility] [address], [address] [place], [place] [address] [phone]

## 2020-08-09 NOTE — Patient Instructions (Addendum)
We will continue current treatment plan. Stay well hydrated, eat regular, well balanced meals and exercise regularly. I recommend trying B12 OTC. Usually 1000-2000iu daily is helpful.   We will follow up in 6 months  Multiple Sclerosis Multiple sclerosis (MS) is a disease of the brain, spinal cord, and optic nerves (central nervous system). It causes the body's disease-fighting (immune) system to destroy the protective covering (myelin sheath) around nerves in the brain. When this happens, signals (nerve impulses) going to and from the brain and spinal cord do not get sent properly or may not get sent at all. There are several types of MS:  Relapsing-remitting MS. This is the most common type. This causes sudden attacks of symptoms. After an attack, you may recover completely until the next attack, or some symptoms may remain permanently.  Secondary progressive MS. This usually develops after the onset of relapsing-remitting MS. Similar to relapsing-remitting MS, this type also causes sudden attacks of symptoms. Attacks may be less frequent, but symptoms slowly get worse (progress) over time.  Primary progressive MS. This causes symptoms that steadily progress over time. This type of MS does not cause sudden attacks of symptoms. The age of onset of MS varies, but it often develops between 22-35 years of age. MS is a lifelong (chronic) condition. There is no cure, but treatment can help slow down the progression of the disease. What are the causes? The cause of this condition is not known. What increases the risk? You are more likely to develop this condition if:  You are a woman.  You have a relative with MS. However, the condition is not passed from parent to child (inherited).  You have a lack (deficiency) of vitamin D.  You smoke. MS is more common in the [place] than in the [place]. What are the signs or symptoms? Relapsing-remitting and secondary  progressive MS cause symptoms to occur in episodes or attacks that may last weeks to months. There may be Dayal periods between attacks in which there are almost no symptoms. Primary progressive MS causes symptoms to steadily progress after they develop. Symptoms of MS vary because of the many different ways it affects the central nervous system. The main symptoms include:  Vision problems and eye pain.  Numbness.  Weakness.  Inability to move your arms, hands, feet, or legs (paralysis).  Balance problems.  Shaking that you cannot control (tremors).  Muscle spasms.  Problems with thinking (cognitive changes). MS can also cause symptoms that are associated with the disease, but are not always the direct result of an MS attack. They may include:  Inability to control urination or bowel movements (incontinence).  Headaches.  Fatigue.  Inability to tolerate heat.  Emotional changes.  Depression.  Pain. How is this diagnosed? This condition is diagnosed based on:  Your symptoms.  A neurological exam. This involves checking central nervous system function, such as nerve function, reflexes, and coordination.  MRIs of the brain and spinal cord.  Lab tests, including a lumbar puncture that tests the fluid that surrounds the brain and spinal cord (cerebrospinal fluid).  Tests to measure the electrical activity of the brain in response to stimulation (evoked potentials). How is this treated? There is no cure for MS, but medicines can help decrease the number and frequency of attacks and help relieve nuisance symptoms. Treatment options may include:  Medicines that reduce the frequency of attacks. These medicines may be given by injection, by mouth (orally), or through an  IV.  Medicines that reduce inflammation (steroids). These may provide short-term relief of symptoms.  Medicines to help control pain, depression, fatigue, or incontinence.  Vitamin D, if you have a  deficiency.  Using devices to help you move around (assistive devices), such as braces, a cane, or a walker.  Physical therapy to strengthen and stretch your muscles.  Occupational therapy to help you with everyday tasks.  Alternative or complementary treatments such as exercise, massage, or acupuncture. Follow these instructions at home:  Take over-the-counter and prescription medicines only as told by your health care provider.  Do not drive or use heavy machinery while taking prescription pain medicine.  Use assistive devices as recommended by your physical therapist or your health care provider.  Exercise as directed by your health care provider.  Return to your normal activities as told by your health care provider. Ask your health care provider what activities are safe for you.  Reach out for support. Share your feelings with friends, family, or a support group.  Keep all follow-up visits as told by your health care provider and therapists. This is important. Where to find more information  National Multiple Sclerosis Society: https://www.nationalmssociety.org Contact a health care provider if:  You feel depressed.  You develop new pain or numbness.  You have tremors.  You have problems with sexual function. Get help right away if:  You develop paralysis.  You develop numbness.  You have problems with your bladder or bowel function.  You develop double vision.  You lose vision in one or both eyes.  You develop suicidal thoughts.  You develop severe confusion. If you ever feel like you may hurt yourself or others, or have thoughts about taking your own life, get help right away. You can go to your nearest emergency department or call:  Your local emergency services (911 in the [place]  A suicide crisis helpline, such as the [facility] at 380-288-8625. This is open 24 hours a day. Summary  Multiple sclerosis (MS) is a disease of  the central nervous system that causes the body's immune system to destroy the protective covering (myelin sheath) around nerves in the brain.  There are 3 types of MS: relapsing-remitting, secondary progressive, and primary progressive. Relapsing-remitting and secondary progressive MS cause symptoms to occur in episodes or attacks that may last weeks to months. Primary progressive MS causes symptoms to steadily progress after they develop.  There is no cure for MS, but medicines can help decrease the number and frequency of attacks and help relieve nuisance symptoms. Treatment may also include physical or occupational therapy.  If you develop numbness, paralysis, vision problems, or other neurological symptoms, get help right away. This information is not intended to replace advice given to you by your health care provider. Make sure you discuss any questions you have with your health care provider. Document Revised: 11/01/2017 Document Reviewed: 01/28/2017 Elsevier Patient Education  2020 [employer].

## 2020-08-10 DIAGNOSIS — G35 Multiple sclerosis: Secondary | ICD-10-CM | POA: Diagnosis not present

## 2020-08-10 LAB — COMPREHENSIVE METABOLIC PANEL
ALT: 20 IU/L (ref 0–32)
AST: 18 IU/L (ref 0–40)
Albumin/Globulin Ratio: 1.9 (ref 1.2–2.2)
Albumin: 4.4 g/dL (ref 3.9–5.0)
Alkaline Phosphatase: 55 IU/L (ref 48–121)
BUN/Creatinine Ratio: 16 (ref 9–23)
BUN: 11 mg/dL (ref 6–20)
Bilirubin Total: 0.2 mg/dL (ref 0.0–1.2)
CO2: 23 mmol/L (ref 20–29)
Calcium: 9.1 mg/dL (ref 8.7–10.2)
Chloride: 102 mmol/L (ref 96–106)
Creatinine, Ser: 0.69 mg/dL (ref 0.57–1.00)
GFR calc Af Amer: 136 mL/min/{1.73_m2} (ref >59)
GFR calc non Af Amer: 118 mL/min/{1.73_m2} (ref >59)
Globulin, Total: 2.3 g/dL (ref 1.5–4.5)
Glucose: 79 mg/dL (ref 65–99)
Potassium: 4.3 mmol/L (ref 3.5–5.2)
Sodium: 138 mmol/L (ref 134–144)
Total Protein: 6.7 g/dL (ref 6.0–8.5)

## 2020-08-10 LAB — VITAMIN D 25 HYDROXY (VIT D DEFICIENCY, FRACTURES): Vit D, 25-Hydroxy: 39.7 ng/mL (ref 30.0–100.0)

## 2020-08-10 LAB — CBC WITH DIFFERENTIAL/PLATELET
Basophils Absolute: 0.1 10*3/uL (ref 0.0–0.2)
Basos: 1 %
EOS (ABSOLUTE): 0.2 10*3/uL (ref 0.0–0.4)
Eos: 2 %
Hematocrit: 38.5 % (ref 34.0–46.6)
Hemoglobin: 13 g/dL (ref 11.1–15.9)
Immature Grans (Abs): 0 10*3/uL (ref 0.0–0.1)
Immature Granulocytes: 0 %
Lymphocytes Absolute: 4.7 10*3/uL — ABNORMAL HIGH (ref 0.7–3.1)
Lymphs: 45 %
MCH: 29.6 pg (ref 26.6–33.0)
MCHC: 33.8 g/dL (ref 31.5–35.7)
MCV: 88 fL (ref 79–97)
Monocytes Absolute: 0.6 10*3/uL (ref 0.1–0.9)
Monocytes: 6 %
Neutrophils Absolute: 4.8 10*3/uL (ref 1.4–7.0)
Neutrophils: 46 %
Platelets: 233 10*3/uL (ref 150–450)
RBC: 4.39 x10E6/uL (ref 3.77–5.28)
RDW: 13.1 % (ref 11.7–15.4)
WBC: 10.4 10*3/uL (ref 3.4–10.8)

## 2020-08-11 ENCOUNTER — Encounter: Payer: Self-pay | Admitting: Neurology

## 2020-08-12 LAB — STRATIFY JCV AB (W/ INDEX) W/ RFLX
Index Value: 0.12
Stratify JCV (TM) Ab w/Reflex Inhibition: NEGATIVE

## 2020-08-29 ENCOUNTER — Encounter: Payer: 59 | Admitting: Certified Nurse Midwife

## 2020-09-07 DIAGNOSIS — G35 Multiple sclerosis: Secondary | ICD-10-CM | POA: Diagnosis not present

## 2020-09-08 ENCOUNTER — Encounter: Payer: Self-pay | Admitting: Certified Nurse Midwife

## 2020-09-08 ENCOUNTER — Ambulatory Visit (INDEPENDENT_AMBULATORY_CARE_PROVIDER_SITE_OTHER): Payer: 59 | Admitting: Certified Nurse Midwife

## 2020-09-08 ENCOUNTER — Other Ambulatory Visit (HOSPITAL_COMMUNITY)
Admission: RE | Admit: 2020-09-08 | Discharge: 2020-09-08 | Disposition: A | Discharge status: Home / Self Care | Payer: 59 | Source: Ambulatory Visit | Attending: Certified Nurse Midwife | Admitting: Certified Nurse Midwife

## 2020-09-08 ENCOUNTER — Other Ambulatory Visit: Payer: Self-pay

## 2020-09-08 VITALS — BP 113/80 | HR 83 | Ht 62.0 in | Wt 225.5 lb

## 2020-09-08 DIAGNOSIS — Z01419 Encounter for gynecological examination (general) (routine) without abnormal findings: Secondary | ICD-10-CM

## 2020-09-08 DIAGNOSIS — Z23 Encounter for immunization: Secondary | ICD-10-CM

## 2020-09-08 DIAGNOSIS — N760 Acute vaginitis: Secondary | ICD-10-CM | POA: Insufficient documentation

## 2020-09-08 DIAGNOSIS — Z7251 High risk heterosexual behavior: Secondary | ICD-10-CM | POA: Diagnosis not present

## 2020-09-08 DIAGNOSIS — Z113 Encounter for screening for infections with a predominantly sexual mode of transmission: Secondary | ICD-10-CM

## 2020-09-08 DIAGNOSIS — B9689 Other specified bacterial agents as the cause of diseases classified elsewhere: Secondary | ICD-10-CM | POA: Insufficient documentation

## 2020-09-08 LAB — POCT URINE PREGNANCY: Preg Test, Ur: NEGATIVE

## 2020-09-08 NOTE — Patient Instructions (Signed)
Lactic acid; Citric acid; Potassium bitartrate vaginal gel What is this medicine? LACTIC ACID; CITRIC ACID; POTASSIUM BITARTRATE (LAK tuhk AS id; SIH trik AS id; poe TASS ee um bi TAAR treit) is used for birth control when you have sexual intercourse to help prevent pregnancy. This medicine may be used for other purposes; ask your health care provider or pharmacist if you have questions. COMMON BRAND NAME(S): PHEXXI What should I tell my health care provider before I take this medicine? They need to know if you have any of these conditions:  frequent kidney or urinary tract infections  HIV or AIDS  pelvic or vaginal infection  sexually transmitted disease, like herpes, gonorrhea, or chlamydia  an unusual or allergic reaction to lactic acid, citric acid, potassium bitartrate, other medicines, foods, dyes, or preservatives  pregnant or trying to get pregnant  breast-feeding How should I use this medicine? This medicine is for use in the vagina only. Follow the directions on the prescription label. Wash hands before and after use. To prevent pregnancy, it is very important that this product is used properly. This product must be applied before sexual contact begins. Do not use it in the rectum. Make sure you carefully read and follow the instructions that come with the product. If you have vaginal sex more than 1 time within 1 hour, you must use a new applicator. Use exactly as directed. Talk to your pediatrician regarding the use of this medicine in children. Special care may be needed. Overdosage: If you think you have taken too much of this medicine contact a poison control center or emergency room at once. NOTE: This medicine is only for you. Do not share this medicine with others. What if I miss a dose? Use before each time you have sexual intercourse as directed on the label. If you miss a dose, you may become pregnant. What may interact with this medicine? Interactions are not  expected. This birth control may be used with other medicines used in the vagina to treat infections including miconazole, metronidazole, and tioconazole. If you have questions ask your doctor or pharmacist. Do not use any other vaginal products at the same time without talking to your health care professional. This list may not describe all possible interactions. Give your health care provider a list of all the medicines, herbs, non-prescription drugs, or dietary supplements you use. Also tell them if you smoke, drink alcohol, or use illegal drugs. Some items may interact with your medicine. What should I watch for while using this medicine? This product does not protect you against HIV infection (AIDS) or other sexually transmitted diseases. This product may be used at any time of your menstrual cycle. This product may be used as soon as your healthcare provider tells you it is safe for you to have sex after childbirth, abortion, or miscarriage. This product may be used with most hormonal birth control methods; a vaginal diaphragm; or latex, polyurethane and polyisoprene condoms. Avoid using this product with contraceptive vaginal rings. What side effects may I notice from receiving this medicine? Side effects that you should report to your doctor or health care professional as soon as possible:  allergic reactions like skin rash, itching or hives, swelling  signs and symptoms of infection like fever; chills; pelvic pain; cloudy urine; pain or trouble passing urine  vaginal discharge, itching or odor  vaginal irritation or burning Side effects that usually do not require medical attention (report these to your doctor or health care professional  if they continue or are bothersome):  mild vaginal irritation This list may not describe all possible side effects. Call your doctor for medical advice about side effects. You may report side effects to FDA at 1-800-FDA-1088. Where should I keep my  medicine? Keep out of the reach of children. Store at room temperature between 15 and 30 degrees C (59 and 86 degrees F). Keep in the foil pouch until ready for use. Follow the directions on the product label. Throw away any unused medicine after the expiration date. NOTE: This sheet is a summary. It may not cover all possible information. If you have questions about this medicine, talk to your doctor, pharmacist, or health care provider.  2020 Elsevier/Gold Standard (2019-04-30 09:34:36)   Polycystic Ovarian Syndrome  Polycystic ovarian syndrome (PCOS) is a common hormonal disorder among women of reproductive age. In most women with PCOS, many small fluid-filled sacs (cysts) grow on the ovaries, and the cysts are not part of a normal menstrual cycle. PCOS can cause problems with your menstrual periods and make it difficult to get pregnant. It can also cause an increased risk of miscarriage with pregnancy. If it is not treated, PCOS can lead to serious health problems, such as diabetes and heart disease. What are the causes? The cause of PCOS is not known, but it may be the result of a combination of certain factors, such as:  Irregular menstrual cycle.  High levels of certain hormones (androgens).  Problems with the hormone that helps to control blood sugar (insulin resistance).  Certain genes. What increases the risk? This condition is more likely to develop in women who have a family history of PCOS. What are the signs or symptoms? Symptoms of PCOS may include:  Multiple ovarian cysts.  Infrequent periods or no periods.  Periods that are too frequent or too heavy.  Unpredictable periods.  Inability to get pregnant (infertility) because of not ovulating.  Increased growth of hair on the face, chest, stomach, back, thumbs, thighs, or toes.  Acne or oily skin. Acne may develop during adulthood, and it may not respond to treatment.  Pelvic pain.  Weight gain or  obesity.  Patches of thickened and dark brown or black skin on the neck, arms, breasts, or thighs (acanthosis nigricans).  Excess hair growth on the face, chest, abdomen, or upper thighs (hirsutism). How is this diagnosed? This condition is diagnosed based on:  Your medical history.  A physical exam, including a pelvic exam. Your health care provider may look for areas of increased hair growth on your skin.  Tests, such as: ? Ultrasound. This may be used to examine the ovaries and the lining of the uterus (endometrium) for cysts. ? Blood tests. These may be used to check levels of sugar (glucose), female hormone (testosterone), and female hormones (estrogen and progesterone) in your blood. How is this treated? There is no cure for PCOS, but treatment can help to manage symptoms and prevent more health problems from developing. Treatment varies depending on:  Your symptoms.  Whether you want to have a baby or whether you need birth control (contraception). Treatment may include nutrition and lifestyle changes along with:  Progesterone hormone to start a menstrual period.  Birth control pills to help you have regular menstrual periods.  Medicines to make you ovulate, if you want to get pregnant.  Medicine to reduce excessive hair growth.  Surgery, in severe cases. This may involve making small holes in one or both of your ovaries. This  decreases the amount of testosterone that your body produces. Follow these instructions at home:  Take over-the-counter and prescription medicines only as told by your health care provider.  Follow a healthy meal plan. This can help you reduce the effects of PCOS. ? Eat a healthy diet that includes lean proteins, complex carbohydrates, fresh fruits and vegetables, low-fat dairy products, and healthy fats. Make sure to eat enough fiber.  If you are overweight, lose weight as told by your health care provider. ? Losing 10% of your body weight may  improve symptoms. ? Your health care provider can determine how much weight loss is best for you and can help you lose weight safely.  Keep all follow-up visits as told by your health care provider. This is important. Contact a health care provider if:  Your symptoms do not get better with medicine.  You develop new symptoms. This information is not intended to replace advice given to you by your health care provider. Make sure you discuss any questions you have with your health care provider. Document Revised: 11/01/2017 Document Reviewed: 05/06/2016 Elsevier Patient Education  2020 [place] for Polycystic Ovary Syndrome Polycystic ovary syndrome (PCOS) is a disorder of the chemicals (hormones) that regulate a woman's reproductive system, including monthly periods (menstruation). The condition causes important hormones to be out of balance. PCOS can:  Stop your periods or make them irregular.  Cause cysts to develop on your ovaries.  Make it difficult to get pregnant.  Stop your body from responding to the effects of insulin (insulin resistance). Insulin resistance can lead to obesity and diabetes. Changing what you eat can help you manage PCOS and improve your health. Following a balanced diet can help you lose weight and improve the way that your body uses insulin. What are tips for following this plan?  Follow a balanced diet for meals and snacks. Eat breakfast, lunch, dinner, and one or two snacks every day.  Include protein in each meal and snack.  Choose whole grains instead of products that are made with refined flour.  Eat a variety of foods.  Exercise regularly as told by your health care provider. Aim to do 30 or more minutes of exercise on most days of the week.  If you are overweight or obese: ? Pay attention to how many calories you eat. Cutting down on calories can help you lose weight. ? Work with your health care provider or a diet and nutrition  specialist (dietitian) to figure out how many calories you need each day. What foods can I eat?  Fruits Include a variety of colors and types. All fruits are helpful for PCOS. Vegetables Include a variety of colors and types. All vegetables are helpful for PCOS. Grains Whole grains, such as whole wheat. Whole-grain breads, crackers, cereals, and pasta. Unsweetened oatmeal, bulgur, barley, quinoa, and brown rice. Tortillas made from corn or whole-wheat flour. Meats and other proteins Low-fat (lean) proteins, such as fish, chicken, beans, eggs, and tofu. Dairy Low-fat dairy products, such as skim milk, cheese sticks, and yogurt. Beverages Low-fat or fat-free drinks, such as water, low-fat milk, sugar-free drinks, and small amounts of 100% fruit juice. Seasonings and condiments Ketchup. Mustard. Barbecue sauce. Relish. Low-fat or fat-free mayonnaise. Fats and oils Olive oil or canola oil. Walnuts and almonds. The items listed above may not be a complete list of recommended foods and beverages. Contact a dietitian for more options. What foods are not recommended? Foods that are high in  calories or fat. Fried foods. Sweets. Products that are made from refined white flour, including white bread, pastries, white rice, and pasta. The items listed above may not be a complete list of foods and beverages to avoid. Contact a dietitian for more information. Summary  PCOS is a hormonal imbalance that affects a woman's reproductive system.  You can help to manage your PCOS by exercising regularly and eating a healthy, varied diet of vegetables, fruit, whole grains, low-fat (lean) protein, and low-fat dairy products.  Changing what you eat can improve the way that your body uses insulin, help your hormones reach normal levels, and help you lose weight. This information is not intended to replace advice given to you by your health care provider. Make sure you discuss any questions you have with your  health care provider. Document Revised: 03/11/2019 Document Reviewed: 09/23/2017 Elsevier Patient Education  2020 [place] 27-96 Years Old, Female Preventive care refers to visits with your health care provider and lifestyle choices that can promote health and wellness. This includes:  A yearly physical exam. This may also be called an annual well check.  Regular dental visits and eye exams.  Immunizations.  Screening for certain conditions.  Healthy lifestyle choices, such as eating a healthy diet, getting regular exercise, not using drugs or products that contain nicotine and tobacco, and limiting alcohol use. What can I expect for my preventive care visit? Physical exam Your health care provider will check your:  Height and weight. This may be used to calculate body mass index (BMI), which tells if you are at a healthy weight.  Heart rate and blood pressure.  Skin for abnormal spots. Counseling Your health care provider may ask you questions about your:  Alcohol, tobacco, and drug use.  Emotional well-being.  Home and relationship well-being.  Sexual activity.  Eating habits.  Work and work Statistician.  Method of birth control.  Menstrual cycle.  Pregnancy history. What immunizations do I need?  Influenza (flu) vaccine  This is recommended every year. Tetanus, diphtheria, and pertussis (Tdap) vaccine  You may need a Td booster every 10 years. Varicella (chickenpox) vaccine  You may need this if you have not been vaccinated. Human papillomavirus (HPV) vaccine  If recommended by your health care provider, you may need three doses over 6 months. Measles, mumps, and rubella (MMR) vaccine  You may need at least one dose of MMR. You may also need a second dose. Meningococcal conjugate (MenACWY) vaccine  One dose is recommended if you are age 99-21 years and a first-year college student living in a residence hall, or if you have  one of several medical conditions. You may also need additional booster doses. Pneumococcal conjugate (PCV13) vaccine  You may need this if you have certain conditions and were not previously vaccinated. Pneumococcal polysaccharide (PPSV23) vaccine  You may need one or two doses if you smoke cigarettes or if you have certain conditions. Hepatitis A vaccine  You may need this if you have certain conditions or if you travel or work in places where you may be exposed to hepatitis A. Hepatitis B vaccine  You may need this if you have certain conditions or if you travel or work in places where you may be exposed to hepatitis B. Haemophilus influenzae type b (Hib) vaccine  You may need this if you have certain conditions. You may receive vaccines as individual doses or as more than one vaccine together in one shot (combination  vaccines). Talk with your health care provider about the risks and benefits of combination vaccines. What tests do I need?  Blood tests  Lipid and cholesterol levels. These may be checked every 5 years starting at age 93.  Hepatitis C test.  Hepatitis B test. Screening  Diabetes screening. This is done by checking your blood sugar (glucose) after you have not eaten for a while (fasting).  Sexually transmitted disease (STD) testing.  BRCA-related cancer screening. This may be done if you have a family history of breast, ovarian, tubal, or peritoneal cancers.  Pelvic exam and Pap test. This may be done every 3 years starting at age 52. Starting at age 30, this may be done every 5 years if you have a Pap test in combination with an HPV test. Talk with your health care provider about your test results, treatment options, and if necessary, the need for more tests. Follow these instructions at home: Eating and drinking   Eat a diet that includes fresh fruits and vegetables, whole grains, lean protein, and low-fat dairy.  Take vitamin and mineral supplements as  recommended by your health care provider.  Do not drink alcohol if: ? Your health care provider tells you not to drink. ? You are pregnant, may be pregnant, or are planning to become pregnant.  If you drink alcohol: ? Limit how much you have to 0-1 drink a day. ? Be aware of how much alcohol is in your drink. In the [place] one drink equals one 12 oz bottle of beer (355 mL), one 5 oz glass of wine (148 mL), or one 1 oz glass of hard liquor (44 mL). Lifestyle  Take daily care of your teeth and gums.  Stay active. Exercise for at least 30 minutes on 5 or more days each week.  Do not use any products that contain nicotine or tobacco, such as cigarettes, e-cigarettes, and chewing tobacco. If you need help quitting, ask your health care provider.  If you are sexually active, practice safe sex. Use a condom or other form of birth control (contraception) in order to prevent pregnancy and STIs (sexually transmitted infections). If you plan to become pregnant, see your health care provider for a preconception visit. What's next?  Visit your health care provider once a year for a well check visit.  Ask your health care provider how often you should have your eyes and teeth checked.  Stay up to date on all vaccines. This information is not intended to replace advice given to you by your health care provider. Make sure you discuss any questions you have with your health care provider. Document Revised: 07/31/2018 Document Reviewed: 07/31/2018 Elsevier Patient Education  2020 [employer].

## 2020-09-08 NOTE — Progress Notes (Signed)
ANNUAL PREVENTATIVE CARE GYN  ENCOUNTER NOTE  Subjective:       Tracey Lindsey is a 29 y.o. G66P0010 female here for a routine annual gynecologic exam.  Current complaints: 1. Requests STI screening  Denies difficulty breathing or respiratory distress, chest pain, abdominal pain, excessive vaginal bleeding, dysuria, and leg pain or swelling.    Gynecologic History  Patient's last menstrual period was 08/13/2020 (exact date). Period Cycle (Days): 28 (consistent but varies) Period Duration (Days): 5 Period Pattern: Regular Menstrual Flow: Moderate Menstrual Control: Maxi pad, Tampon Menstrual Control Change Freq (Hours): 5-6 Dysmenorrhea: None  Contraception: none  Last Pap: 08/2019. Results were: normal  Obstetric History  OB History  Gravida Para Term Preterm AB Living  1 0 0 0 1 0  SAB TAB Ectopic Multiple Live Births  0 1 0 0 0    # Outcome Date GA Lbr Len/2nd Weight Sex Delivery Anes PTL Lv  1 TAB 2017            Past Medical History:  Diagnosis Date  . Anxiety   . Chlamydia   . Depression   . History of abnormal cervical Papanicolaou smear   . MS (multiple sclerosis) [facility] April 2016  . PCOS (polycystic ovarian syndrome)   . Vitamin D deficiency     Past Surgical History:  Procedure Laterality Date  . NO PAST SURGERIES      Current Outpatient Medications on File Prior to Visit  Medication Sig Dispense Refill  . Cholecalciferol (VITAMIN D3) LIQD 2,000 Units by Does not apply route.    Marland Kitchen LORazepam (ATIVAN) 0.5 MG tablet Take 1 tablet (0.5 mg total) by mouth 2 (two) times daily as needed for anxiety. 15 tablet 1  . natalizumab (TYSABRI) 300 MG/15ML injection Inject 15 mLs into the vein every 30 (thirty) days.     Marland Kitchen NIKKI 3-0.02 MG tablet TAKE 1 TABLET BY MOUTH EVERY DAY (Patient not taking: Reported on 09/08/2020) 28 tablet 2  . VANADIUM PO Take 2 mg by mouth daily. With Chormium (Patient not taking: Reported on 09/08/2020)     Current Facility-Administered  Medications on File Prior to Visit  Medication Dose Route Frequency Provider Last Rate Last Admin  . gadopentetate dimeglumine (MAGNEVIST) injection 18 mL  18 mL Intravenous Once PRN Sater, Pearletha Furl, MD        No Known Allergies  Social History   Socioeconomic History  . Marital status: Single    Spouse name: Not on file  . Number of children: Not on file  . Years of education: 24  . Highest education level: Master's degree (e.g., MA, MS, MEng, MEd, MSW, MBA)  Occupational History  . Occupation: Visual merchandiser  Tobacco Use  . Smoking status: Never Smoker  . Smokeless tobacco: Never Used  Vaping Use  . Vaping Use: Never used  Substance and Sexual Activity  . Alcohol use: Not Currently    Comment: occasionally  . Drug use: Never  . Sexual activity: Yes    Partners: Male    Birth control/protection: None  Other Topics Concern  . Not on file  Social History Narrative   Lives with mother    Drinks soda daily (8-10 ounces)   Social Determinants of Health   Financial Resource Strain:   . Difficulty of Paying Living Expenses: Not on file  Food Insecurity:   . Worried About Programme researcher, broadcasting/film/video in the Last Year: Not on file  . Ran Out of Food in  the Last Year: Not on file  Transportation Needs:   . Lack of Transportation (Medical): Not on file  . Lack of Transportation (Non-Medical): Not on file  Physical Activity:   . Days of Exercise per Week: Not on file  . Minutes of Exercise per Session: Not on file  Stress:   . Feeling of Stress : Not on file  Social Connections:   . Frequency of Communication with Friends and Family: Not on file  . Frequency of Social Gatherings with Friends and Family: Not on file  . Attends Religious Services: Not on file  . Active Member of Clubs or Organizations: Not on file  . Attends Banker Meetings: Not on file  . Marital Status: Not on file  Intimate Partner Violence:   . Fear of Current or Ex-Partner: Not on file   . Emotionally Abused: Not on file  . Physically Abused: Not on file  . Sexually Abused: Not on file    Family History  Problem Relation Age of Onset  . Diabetes Father   . Hypertension Father   . Stroke Father   . Cancer Mother        lung  . Hyperlipidemia Mother   . Lung disease Mother   . Eczema Brother   . Breast cancer Neg Hx   . Ovarian cancer Neg Hx   . Colon cancer Neg Hx     The following portions of the patient's history were reviewed and updated as appropriate: allergies, current medications, past family history, past medical history, past social history, past surgical history and problem list.  Review of Systems  ROS negative except as noted above. Information obtained from patient.    Objective:   BP 113/80   Pulse 83   Ht 5\' 2"  (1.575 m)   Wt 225 lb 8 oz (102.3 kg)   LMP 08/13/2020 (Exact Date)   BMI 41.24 kg/m    CONSTITUTIONAL: Well-developed, well-nourished female in no acute distress.   PSYCHIATRIC: Normal mood and affect. Normal behavior. Normal judgment and thought content.  NEUROLGIC: Alert and oriented to person, place, and time. Normal muscle tone coordination. No cranial nerve deficit noted.  HENT:  Normocephalic, atraumatic, External right and left ear normal.  EYES: Conjunctivae and EOM are normal. Pupils are equal and round.   NECK: Normal range of motion, supple, no masses.  Normal thyroid.   SKIN: Skin is warm and dry. No rash noted. Not diaphoretic. No erythema. No pallor.  CARDIOVASCULAR: Normal heart rate noted, regular rhythm, no murmur.  RESPIRATORY: Clear to auscultation bilaterally. Effort and breath sounds normal, no problems with respiration noted.  BREASTS: Symmetric in size. No masses, skin changes, nipple drainage, or lymphadenopathy.  ABDOMEN: Soft, normal bowel sounds, no distention noted.  No tenderness, rebound or guarding.   PELVIC:  External Genitalia: Normal  Vagina: Normal  Cervix: Normal  Uterus:  Normal  Adnexa: Normal   MUSCULOSKELETAL: Normal range of motion. No tenderness.  No cyanosis, clubbing, or edema.  2+ distal pulses.  LYMPHATIC: No Axillary, Supraclavicular, or Inguinal Adenopathy.  Assessment:   Annual gynecologic examination 29 y.o.   Contraception: Phexxi   Obesity 3   Problem List Items Addressed This Visit    None    Visit Diagnoses    Need for influenza vaccination    -  Primary   Well woman exam       Relevant Orders   Cervicovaginal ancillary only   RPR   HIV Antibody (routine  testing w rflx)   HSV(herpes simplex vrs) 1+2 ab-IgG   Routine screening for STI (sexually transmitted infection)       Relevant Orders   Cervicovaginal ancillary only   RPR   HIV Antibody (routine testing w rflx)   HSV(herpes simplex vrs) 1+2 ab-IgG   Unprotected sexual intercourse       Relevant Orders   POCT urine pregnancy (Completed)      Plan:   Pap: Not needed  Labs: See orders  Routine preventative health maintenance measures emphasized: Exercise/Diet/Weight control, Tobacco Warnings, Alcohol/Substance use risks, Stress Management, Peer Pressure Issues and Safe Sex; see AVS  Samples of Phexxi provided  Return to Clinic - 1 Year for [facility] or sooner if needed   Serafina Royals, CNM  [facility], [facility]

## 2020-09-09 ENCOUNTER — Telehealth: Payer: Self-pay | Admitting: Certified Nurse Midwife

## 2020-09-09 LAB — RPR: RPR Ser Ql: NONREACTIVE

## 2020-09-09 LAB — HSV(HERPES SIMPLEX VRS) I + II AB-IGG
HSV 1 Glycoprotein G Ab, IgG: <0.91 index (ref 0.00–0.90)
HSV 2 IgG, Type Spec: <0.91 index (ref 0.00–0.90)

## 2020-09-09 LAB — HIV ANTIBODY (ROUTINE TESTING W REFLEX): HIV Screen 4th Generation wRfx: NONREACTIVE

## 2020-09-09 NOTE — Telephone Encounter (Signed)
Patient called in wanting to speak with you regarding her lab results. I informed the patient that you had responded back to her MyChart messages however she stated she would like to speak with you regarding this. I told the patient I would send a message to you to let you know she called in and that you were in clinic with patients right now. Patient verbalized understanding.

## 2020-09-09 NOTE — Telephone Encounter (Signed)
HIPPA approved voicemail left on identified line asking patient to call back with further needs, questions or concerns.    Serafina Royals, CNM [facility], [facility] 09/09/20 2:41 PM

## 2020-09-11 LAB — CERVICOVAGINAL ANCILLARY ONLY
Bacterial Vaginitis (gardnerella): POSITIVE — AB
Candida Glabrata: NEGATIVE
Candida Vaginitis: NEGATIVE
Chlamydia: NEGATIVE
Comment: NEGATIVE
Comment: NEGATIVE
Comment: NEGATIVE
Comment: NEGATIVE
Comment: NEGATIVE
Comment: NORMAL
Neisseria Gonorrhea: NEGATIVE
Trichomonas: NEGATIVE

## 2020-09-12 ENCOUNTER — Other Ambulatory Visit: Payer: Self-pay | Admitting: Certified Nurse Midwife

## 2020-09-12 DIAGNOSIS — B9689 Other specified bacterial agents as the cause of diseases classified elsewhere: Secondary | ICD-10-CM

## 2020-09-12 DIAGNOSIS — N760 Acute vaginitis: Secondary | ICD-10-CM

## 2020-09-12 MED ORDER — METRONIDAZOLE 500 MG PO TABS: 500.0000 mg | ORAL_TABLET | Freq: Two times a day (BID) | ORAL | 0 refills | Status: AC

## 2020-09-12 NOTE — Progress Notes (Signed)
Rx Flagyl, see orders.    Serafina Royals, CNM [facility], [facility] 09/12/20 2:51 PM

## 2020-09-23 ENCOUNTER — Other Ambulatory Visit: Payer: Self-pay

## 2020-09-23 MED ORDER — FLUCONAZOLE 150 MG PO TABS: 150.0000 mg | ORAL_TABLET | Freq: Once | ORAL | 1 refills | Status: AC

## 2020-10-05 DIAGNOSIS — G35 Multiple sclerosis: Secondary | ICD-10-CM | POA: Diagnosis not present

## 2020-10-17 ENCOUNTER — Telehealth: Payer: Self-pay | Admitting: *Deleted

## 2020-10-17 NOTE — Telephone Encounter (Signed)
Faxed completed/signed Tysabri pt status report and reauth questionnaire to [facility] at [phone]. Received confirmation.   Received fax back that pt re-authorized from 10/17/2020-05/21/2021. Pt enrollment number: [id number]. Account: [facility]. Site auth number: [id number]

## 2020-11-02 DIAGNOSIS — G35 Multiple sclerosis: Secondary | ICD-10-CM | POA: Diagnosis not present

## 2020-11-30 DIAGNOSIS — G35 Multiple sclerosis: Secondary | ICD-10-CM | POA: Diagnosis not present

## 2020-12-28 DIAGNOSIS — G35 Multiple sclerosis: Secondary | ICD-10-CM | POA: Diagnosis not present

## 2021-01-25 DIAGNOSIS — G35 Multiple sclerosis: Secondary | ICD-10-CM | POA: Diagnosis not present

## 2021-01-30 DIAGNOSIS — H5213 Myopia, bilateral: Secondary | ICD-10-CM | POA: Diagnosis not present

## 2021-02-07 ENCOUNTER — Ambulatory Visit: Payer: 59 | Admitting: Neurology

## 2021-02-07 ENCOUNTER — Other Ambulatory Visit: Payer: Self-pay

## 2021-02-07 ENCOUNTER — Encounter: Payer: Self-pay | Admitting: Neurology

## 2021-02-07 ENCOUNTER — Telehealth: Payer: Self-pay | Admitting: *Deleted

## 2021-02-07 VITALS — BP 110/80 | HR 87 | Ht 62.0 in | Wt 218.5 lb

## 2021-02-07 DIAGNOSIS — Z79899 Other long term (current) drug therapy: Secondary | ICD-10-CM | POA: Diagnosis not present

## 2021-02-07 DIAGNOSIS — G35 Multiple sclerosis: Secondary | ICD-10-CM

## 2021-02-07 DIAGNOSIS — R351 Nocturia: Secondary | ICD-10-CM | POA: Diagnosis not present

## 2021-02-07 DIAGNOSIS — G35D Multiple sclerosis, unspecified: Secondary | ICD-10-CM

## 2021-02-07 DIAGNOSIS — R208 Other disturbances of skin sensation: Secondary | ICD-10-CM | POA: Diagnosis not present

## 2021-02-07 MED ORDER — LORAZEPAM 0.5 MG PO TABS: 0.5000 mg | ORAL_TABLET | Freq: Two times a day (BID) | ORAL | 1 refills | Status: DC | PRN

## 2021-02-07 NOTE — Telephone Encounter (Signed)
Placed [facility] lab in [facility] lock box for routine lab pick up. Results pending. 

## 2021-02-07 NOTE — Progress Notes (Signed)
[facility]  PATIENT: SAVERA MISSOURI DOB: 03/07/1991  REFERRING DOCTOR OR PCP:  Luan Pulling Jaci Lazier) SOURCE: Patient and ER records and PACS images  _________________________________   HISTORICAL  CHIEF COMPLAINT:  Chief Complaint  Patient presents with  . Follow-up    RM 12. Last seen 08/09/2020 by AL,NP. On Tysabri for MS. Last infusion: 01/25/21, next: 02/22/21. Last JCV 08/09/20, negative, index: 0.12.    HISTORY OF PRESENT ILLNESS:  Allecia Koesters is a 30 y.o. woman recently diagnosed with MS.     Update 02/03/2020: She is on Tysabri,   She feels stable and has no major side effects.  She notes a little hair thinning  No recent exacerbation and no new symptoms.  She is JCV Ab negative (0.12) last checked September 2021.  Gait is doing well and she can walk a couple miles without a rest..   Balance is doing ok but she uses the bannister on stairs..    She denies weakness.  She has occasional left foot numbness, worse if sitting on a toilet.   Bladder function is the same with some frequency and 2 - 3x nocturia .  She has no incontinence.  She prefers to hold off on a medication at thtis time.  Marland Kitchen  She wakes up 2-3 times nightly to urinate.      Vision is the same --- right eye blurry but left and binocular is 20/20.    She notes some fatigue that worsens as day goes on.    She gets everything done during the day that she needs to do.   She usually  sleeps well and quickly falls back to sleep after nocturia.  She denies depression but has mild anxiety.  She rarely takes a lorazepam, usually if she has a lot of thoughts at night and gets insomnia.  Cognition is doing well.     We discussed Tysabri and pregnancy  She has done the Covid-19 vaccine and booster (Neurosurgeon).   MS History:    In retrospect, she had an episode of vertigo in 2014 with mild vision changes that improved.    In March 2016, she had vertigo that improved after a couple weeks.   In late Adamaris King, she had  numbness in the left foot. When she woke up the next morning, she noted that the numbness was higher up the left arm and the next day it also involved the left hand and she went to the emergency room. In the emergency room, she had an MRI of the brain that showed white matter foci were some for multiple sclerosis.     IMAGING MRI of the brain with and without contrast 03/29/15 showed several posterior fossa lesions with one in the right middle cerebellar peduncle and several in the pons.   The middle cerebellar peduncle focus and 2 of the pontine foci enhanced. There are also several periventricular and deep white matter foci in the cerebral hemispheres.   MRI cervical spine shows one enhancing focus.  Repeat imaging of the brain and cervical spine November 2017 did not show any new lesions. The previously enhancing lesions were smaller and did not enhance.Marland Kitchen     MRI of the brain 02/17/2020 showed Scattered T2/FLAIR hyperintense foci in the hemispheres.  Though nonspecific, the foci are consistent with the diagnosis of multiple sclerosis.  None of the foci appear to be acute and there are no new lesions compared to the 2019 MRI.   There is  a partially empty sella turcica, unchanged in appearance since 2019.  This could be incidental or be associated with elevated intracranial pressure.  MRi of the cervical spiine 02/17/2020 showed T2 hyperintense focus within the spinal cord to the left adjacent to C3-C4.  This was also present on the 2017 MRI and is consistent with a chronic demyelinating plaque associated with multiple sclerosis.  There are no degenerative changes noted  REVIEW OF SYSTEMS: Constitutional: No fevers, chills, sweats, or change in appetite.   She notes some fatigue. Eyes: No visual changes, double vision, eye pain Ear, nose and throat: No hearing loss, ear pain, nasal congestion, sore throat Cardiovascular: No chest pain, palpitations Respiratory: No shortness of  breath at rest or with exertion.   No wheezes GastrointestinaI: No nausea, vomiting, diarrhea, abdominal pain, fecal incontinence Genitourinary: Notes frequency, nocturia (x2).   She has PCOS Musculoskeletal: No neck pain, back pain Integumentary: No rash, pruritus, skin lesions Neurological: as above Psychiatric: as above  Endocrine: No palpitations, diaphoresis, change in appetite, change in weigh or increased thirst Hematologic/Lymphatic: No anemia, purpura, petechiae. Allergic/Immunologic: No itchy/runny eyes, nasal congestion, recent allergic reactions, rashes  ALLERGIES: No Known Allergies  HOME MEDICATIONS:  Current Outpatient Medications:  .  Cholecalciferol (VITAMIN D3) LIQD, 2,000 Units by Does not apply route., Disp: , Rfl:  .  natalizumab (TYSABRI) 300 MG/15ML injection, Inject 15 mLs into the vein every 30 (thirty) days. , Disp: , Rfl:  .  VANADIUM PO, Take 2 mg by mouth daily. With Chormium, Disp: , Rfl:  .  LORazepam (ATIVAN) 0.5 MG tablet, Take 1 tablet (0.5 mg total) by mouth 2 (two) times daily as needed for anxiety., Disp: 15 tablet, Rfl: 1 No current facility-administered medications for this visit.  Facility-Administered Medications Ordered in Other Visits:  .  gadopentetate dimeglumine (MAGNEVIST) injection 18 mL, 18 mL, Intravenous, Once PRN, Sater, Pearletha Furl, MD  PAST MEDICAL HISTORY: Past Medical History:  Diagnosis Date  . Anxiety   . Chlamydia   . Depression   . History of abnormal cervical Papanicolaou smear   . MS (multiple sclerosis) [facility] [place] [place] 2016  . PCOS (polycystic ovarian syndrome)   . Vitamin D deficiency     PAST SURGICAL HISTORY: Past Surgical History:  Procedure Laterality Date  . NO PAST SURGERIES      FAMILY HISTORY: Family History  Problem Relation Age of Onset  . Diabetes Father   . Hypertension Father   . Stroke Father   . Cancer Mother        lung  . Hyperlipidemia Mother   . Lung disease Mother   . Eczema Brother    . Breast cancer Neg Hx   . Ovarian cancer Neg Hx   . Colon cancer Neg Hx     SOCIAL HISTORY:  Social History   Socioeconomic History  . Marital status: Single    Spouse name: Not on file  . Number of children: Not on file  . Years of education: 56  . Highest education level: Master's degree (e.g., MA, MS, MEng, MEd, MSW, MBA)  Occupational History  . Occupation: Visual merchandiser  Tobacco Use  . Smoking status: Never Smoker  . Smokeless tobacco: Never Used  Vaping Use  . Vaping Use: Never used  Substance and Sexual Activity  . Alcohol use: Not Currently    Comment: occasionally  . Drug use: Never  . Sexual activity: Yes    Partners: Male    Birth control/protection: None  Other  Topics Concern  . Not on file  Social History Narrative   Lives with mother    Drinks soda daily (8-10 ounces)   Social Determinants of Health   Financial Resource Strain: Not on file  Food Insecurity: Not on file  Transportation Needs: Not on file  Physical Activity: Not on file  Stress: Not on file  Social Connections: Not on file  Intimate Partner Violence: Not on file     PHYSICAL EXAM  Vitals:   02/07/21 1423  BP: 110/80  Pulse: 87  SpO2: 98%  Weight: 218 lb 8 oz (99.1 kg)  Height: 5\' 2"  (1.575 m)    Body mass index is 39.96 kg/m.   General: The patient is well-developed and well-nourished and in no acute distress   Neurologic Exam  Mental status: The patient is alert and oriented x 3 at the time of the examination. The patient has apparent normal recent and remote memory, with an apparently normal attention span and concentration ability.   Speech is normal.  Cranial nerves:   Extraocular muscles are intact.  Pupils react normally.,  Color vision is normal.  Facial strength and sensation normal. Trapezius strength is strong..    Motor:  Muscle bulk is normal.   Muscle tone is normal.  Strength is 5/5 in the arms and legs.  Normal RAM in hands.   Sensory:    She has normal touch and vibration sensation in the arms and legs.  Coordination: Cerebellar testing reveals good finger-nose-finger bilaterally.  Heel-to-shin is performed well.  Gait and station: Station is normal.  Gait and tandem gait are normal.    Reflexes: Deep tendon reflexes are normal and symmetric.   DIAGNOSTIC DATA (LABS, IMAGING, TESTING) - I reviewed patient records, labs, notes, testing and imaging myself where available.  Lab Results  Component Value Date   WBC 10.4 08/09/2020   HGB 13.0 08/09/2020   HCT 38.5 08/09/2020   MCV 88 08/09/2020   PLT 233 08/09/2020      Component Value Date/Time   NA 138 08/09/2020 1446   K 4.3 08/09/2020 1446   CL 102 08/09/2020 1446   CO2 23 08/09/2020 1446   GLUCOSE 79 08/09/2020 1446   GLUCOSE 84 05/30/2020 1512   BUN 11 08/09/2020 1446   CREATININE 0.69 08/09/2020 1446   CREATININE 0.72 05/30/2020 1512   CALCIUM 9.1 08/09/2020 1446   PROT 6.7 08/09/2020 1446   ALBUMIN 4.4 08/09/2020 1446   AST 18 08/09/2020 1446   ALT 20 08/09/2020 1446   ALKPHOS 55 08/09/2020 1446   BILITOT 0.2 08/09/2020 1446   GFRNONAA 118 08/09/2020 1446   GFRNONAA 114 05/30/2020 1512   GFRAA 136 08/09/2020 1446   GFRAA 132 05/30/2020 1512   No results found.    ASSESSMENT AND PLAN  Multiple sclerosis (HCC) - Plan: CBC with Differential/Platelet, Stratify JCV Antibody Test ([facility])  High risk medication use - Plan: CBC with Differential/Platelet, Stratify JCV Antibody Test ([facility])  Dysesthesia affecting both sides of body  Nocturia   1.   She will continue Tysabri.  I will check a JCV antibody and CBC with differential today.    She will need an MRI of the brain when she returns later in the year.  If she converts to JCV Ab positive or if she has any subclinical progressio consider a change to Ocrevus or MAvencladi 3.  Stay active and exercise as tolerated.  4.   We discussed Tysabri and pregnanacy 5.  Continue vitamin  D supplements.    5.   She will return to see me in 6 months for regular visits and call sooner if she has new or worsening symptoms.    Richard A. Epimenio Foot, MD, PhD 02/07/2021, 3:04 PM Certified in Neurology, Clinical Neurophysiology, Sleep Medicine, Pain Medicine and Neuroimaging  [facility] [address] [place], [place] [address] [phone]

## 2021-02-08 LAB — CBC WITH DIFFERENTIAL/PLATELET
Basophils Absolute: 0.1 10*3/uL (ref 0.0–0.2)
Basos: 1 %
EOS (ABSOLUTE): 0.3 10*3/uL (ref 0.0–0.4)
Eos: 3 %
Hematocrit: 38 % (ref 34.0–46.6)
Hemoglobin: 12.8 g/dL (ref 11.1–15.9)
Immature Grans (Abs): 0 10*3/uL (ref 0.0–0.1)
Immature Granulocytes: 0 %
Lymphocytes Absolute: 4.1 10*3/uL — ABNORMAL HIGH (ref 0.7–3.1)
Lymphs: 41 %
MCH: 30.7 pg (ref 26.6–33.0)
MCHC: 33.7 g/dL (ref 31.5–35.7)
MCV: 91 fL (ref 79–97)
Monocytes Absolute: 0.5 10*3/uL (ref 0.1–0.9)
Monocytes: 5 %
Neutrophils Absolute: 5 10*3/uL (ref 1.4–7.0)
Neutrophils: 50 %
Platelets: 215 10*3/uL (ref 150–450)
RBC: 4.17 x10E6/uL (ref 3.77–5.28)
RDW: 13.2 % (ref 11.7–15.4)
WBC: 9.9 10*3/uL (ref 3.4–10.8)

## 2021-02-14 NOTE — Telephone Encounter (Signed)
Called [facility] and spoke with Montrese. JCV ab lab still pending results.  

## 2021-02-15 NOTE — Telephone Encounter (Signed)
JCV ab drawn on 02/07/21 negative, index: 0.14

## 2021-02-22 DIAGNOSIS — G35 Multiple sclerosis: Secondary | ICD-10-CM | POA: Diagnosis not present

## 2021-02-27 ENCOUNTER — Encounter: Payer: Self-pay | Admitting: Certified Nurse Midwife

## 2021-02-27 ENCOUNTER — Ambulatory Visit: Payer: 59 | Admitting: Certified Nurse Midwife

## 2021-02-27 ENCOUNTER — Other Ambulatory Visit: Payer: Self-pay

## 2021-02-27 VITALS — BP 115/78 | HR 80 | Ht 62.0 in | Wt 215.2 lb

## 2021-02-27 DIAGNOSIS — Z113 Encounter for screening for infections with a predominantly sexual mode of transmission: Secondary | ICD-10-CM | POA: Diagnosis not present

## 2021-02-27 NOTE — Patient Instructions (Signed)
Safe Sex Practicing safe sex means taking steps before and during sex to reduce your risk of:  Getting an STI (sexually transmitted infection).  Giving your partner an STI.  Unwanted or unplanned pregnancy. How to practice safe sex Ways you can practice safe sex  Limit your sexual partners to only one partner who is having sex with only you.  Avoid using alcohol and drugs before having sex. Alcohol and drugs can affect your judgment.  Before having sex with a new partner: ? Talk to your partner about past partners, past STIs, and drug use. ? Get screened for STIs and discuss the results with your partner. Ask your partner to get screened too.  Check your body regularly for sores, blisters, rashes, or unusual discharge. If you notice any of these problems, visit your health care provider.  Avoid sexual contact if you have symptoms of an infection or you are being treated for an STI.  While having sex, use a condom. Make sure to: ? Use a condom every time you have vaginal, oral, or anal sex. Both females and males should wear condoms during oral sex. ? Keep condoms in place from the beginning to the end of sexual activity. ? Use a latex condom, if possible. Latex condoms offer the best protection. ? Use only water-based lubricants with a condom. Using petroleum-based lubricants or oils will weaken the condom and increase the chance that it will break.   Ways your health care provider can help you practice safe sex  See your health care provider for regular screenings, exams, and tests for STIs.  Talk with your health care provider about what kind of birth control (contraception) is best for you.  Get vaccinated against hepatitis B and human papillomavirus (HPV).  If you are at risk of being infected with HIV (human immunodeficiency virus), talk with your health care provider about taking a prescription medicine to prevent HIV infection. You are at risk for HIV if you: ? Are a man  who has sex with other men. ? Are sexually active with more than one partner. ? Take drugs by injection. ? Have a sex partner who has HIV. ? Have unprotected sex. ? Have sex with someone who has sex with both men and women. ? Have had an STI.   Follow these instructions at home:  Take over-the-counter and prescription medicines only as told by your health care provider.  Keep all follow-up visits. This is important. Where to find more information  Centers for Disease Control and Prevention: www.cdc.gov  Planned Parenthood: www.plannedparenthood.org  Office on Women's Health: www.womenshealth.gov Summary  Practicing safe sex means taking steps before and during sex to reduce your risk getting an STI, giving your partner an STI, and having an unwanted or unplanned pregnancy.  Before having sex with a new partner, talk to your partner about past partners, past STIs, and drug use.  Use a condom every time you have vaginal, oral, or anal sex. Both females and males should wear condoms during oral sex.  Check your body regularly for sores, blisters, rashes, or unusual discharge. If you notice any of these problems, visit your health care provider.  See your health care provider for regular screenings, exams, and tests for STIs. This information is not intended to replace advice given to you by your health care provider. Make sure you discuss any questions you have with your health care provider. Document Revised: 04/25/2020 Document Reviewed: 04/25/2020 Elsevier Patient Education  2021 Elsevier Inc.  

## 2021-02-27 NOTE — Progress Notes (Signed)
Pt present for STD screening. Pt would like to have the entire STD screening due to unprotected sex.

## 2021-02-27 NOTE — Progress Notes (Signed)
GYN ENCOUNTER NOTE  Subjective:       Tracey Lindsey is a 30 y.o. G11P0010 female here for STI testing after unprotected intercourse for the last year.   Denies symptoms at this time, no other concerns.   Gynecologic History  Patient's last menstrual period was 02/02/2021 (exact date).  Contraception: none  Last Pap: 08/2019. Results were: normal, see Media tab for results   Obstetric History  OB History  Gravida Para Term Preterm AB Living  1 0 0 0 1 0  SAB IAB Ectopic Multiple Live Births  0 1 0 0 0    # Outcome Date GA Lbr Len/2nd Weight Sex Delivery Anes PTL Lv  1 IAB 2017            Past Medical History:  Diagnosis Date  . Anxiety   . Chlamydia   . Depression   . History of abnormal cervical Papanicolaou smear   . MS (multiple sclerosis) [facility]) April 2016  . PCOS (polycystic ovarian syndrome)   . Vitamin D deficiency     Past Surgical History:  Procedure Laterality Date  . NO PAST SURGERIES      Current Outpatient Medications on File Prior to Visit  Medication Sig Dispense Refill  . Ascorbic Acid (VITAMIN C PO) Take by mouth.    . Cholecalciferol (VITAMIN D3) LIQD 2,000 Units by Does not apply route.    Marland Kitchen LORazepam (ATIVAN) 0.5 MG tablet Take 1 tablet (0.5 mg total) by mouth 2 (two) times daily as needed for anxiety. 15 tablet 1  . natalizumab (TYSABRI) 300 MG/15ML injection Inject 15 mLs into the vein every 30 (thirty) days.      Current Facility-Administered Medications on File Prior to Visit  Medication Dose Route Frequency Provider Last Rate Last Admin  . gadopentetate dimeglumine (MAGNEVIST) injection 18 mL  18 mL Intravenous Once PRN Sater, Pearletha Furl, MD        No Known Allergies  Social History   Socioeconomic History  . Marital status: Single    Spouse name: Not on file  . Number of children: Not on file  . Years of education: 58  . Highest education level: Master's degree (e.g., MA, MS, MEng, MEd, MSW, MBA)  Occupational History  .  Occupation: Visual merchandiser  Tobacco Use  . Smoking status: Never Smoker  . Smokeless tobacco: Never Used  Vaping Use  . Vaping Use: Never used  Substance and Sexual Activity  . Alcohol use: Not Currently    Comment: occasionally  . Drug use: Never  . Sexual activity: Yes    Partners: Male    Birth control/protection: None  Other Topics Concern  . Not on file  Social History Narrative   Lives with mother    Drinks soda daily (8-10 ounces)   Social Determinants of Health   Financial Resource Strain: Not on file  Food Insecurity: Not on file  Transportation Needs: Not on file  Physical Activity: Not on file  Stress: Not on file  Social Connections: Not on file  Intimate Partner Violence: Not on file    Family History  Problem Relation Age of Onset  . Diabetes Father   . Hypertension Father   . Stroke Father   . Cancer Mother        lung  . Hyperlipidemia Mother   . Lung disease Mother   . Eczema Brother   . Colon cancer Maternal Aunt   . Other Maternal Grandmother   . Cancer  Maternal Grandmother   . Breast cancer Neg Hx   . Ovarian cancer Neg Hx     The following portions of the patient's history were reviewed and updated as appropriate: allergies, current medications, past family history, past medical history, past social history, past surgical history and problem list.  Review of Systems  ROS negative except as noted above. Information obtained from patient.   Objective:   BP 115/78   Pulse 80   Ht 5\' 2"  (1.575 m)   Wt 215 lb 3.2 oz (97.6 kg)   LMP 02/02/2021 (Exact Date)   BMI 39.36 kg/m    CONSTITUTIONAL: Well-developed, well-nourished female in no acute distress.   PELVIC:  External Genitalia: Normal  Vagina: Normal  Cervix: Normal, thin prep specimen collected  MUSCULOSKELETAL: Normal range of motion. No tenderness.  No cyanosis, clubbing, or edema.  Assessment:   1. Screening examination for STD (sexually transmitted disease)  -  Viral Hepatitis HBV, HCV - HIV Antibody (routine testing w rflx) - RPR - Cervicovaginal ancillary only     Plan:   Thin prep specimen collected, see orders.   Labs today, see chart.   Reviewed red flag symptoms and when to call.   RTC as previously scheduled or sooner if needed.    04/04/2021, CNM [facility], [facility] 02/27/21 4:47 PM

## 2021-02-28 LAB — VIRAL HEPATITIS HBV, HCV
HCV Ab: <0.1 s/co ratio (ref 0.0–0.9)
Hep B Core Total Ab: NEGATIVE
Hep B Surface Ab, Qual: NONREACTIVE
Hepatitis B Surface Ag: NEGATIVE

## 2021-02-28 LAB — RPR: RPR Ser Ql: NONREACTIVE

## 2021-02-28 LAB — HIV ANTIBODY (ROUTINE TESTING W REFLEX): HIV Screen 4th Generation wRfx: NONREACTIVE

## 2021-02-28 LAB — HCV INTERPRETATION

## 2021-03-06 LAB — CERVICOVAGINAL ANCILLARY ONLY
Chlamydia: NEGATIVE
Comment: NEGATIVE
Comment: NEGATIVE
Comment: NEGATIVE
Comment: NORMAL
HSV1: NEGATIVE
HSV2: NEGATIVE
Neisseria Gonorrhea: NEGATIVE
Trichomonas: NEGATIVE

## 2021-03-22 DIAGNOSIS — G35 Multiple sclerosis: Secondary | ICD-10-CM | POA: Diagnosis not present

## 2021-03-30 ENCOUNTER — Other Ambulatory Visit: Payer: Self-pay | Admitting: Certified Nurse Midwife

## 2021-03-30 MED ORDER — DROSPIRENONE-ETHINYL ESTRADIOL 3-0.02 MG PO TABS: 1.0000 | ORAL_TABLET | Freq: Every day | ORAL | 2 refills | Status: DC

## 2021-03-30 NOTE — Progress Notes (Signed)
Rx Yaz, see orders.   MyChart message received from patient requesting prescription renewal to manage cycle. Reports last intercourse on 02/11/2021 with four (4) negative home pregnancy tests.    Serafina Royals, CNM [facility], [facility] 03/30/21 2:54 PM

## 2021-03-30 NOTE — Telephone Encounter (Signed)
Please advise 

## 2021-04-03 ENCOUNTER — Telehealth: Payer: Self-pay | Admitting: Neurology

## 2021-04-03 NOTE — Telephone Encounter (Signed)
Lurena Joiner @ [facility] is asking for a call re:a  Prior Auth services [id number](infusions) please call

## 2021-04-03 NOTE — Telephone Encounter (Signed)
Printed and gave to infusion suite to follow up on

## 2021-04-04 NOTE — Telephone Encounter (Signed)
ERROR

## 2021-04-17 ENCOUNTER — Telehealth: Payer: Self-pay | Admitting: *Deleted

## 2021-04-17 NOTE — Telephone Encounter (Signed)
Faxed completed/signed Tysabri pt status report and reauth questionnaire to [facility] at [phone]. Received confirmation.   Received fax back that pt re-authorized from 04/17/2021-11/20/2021. Pt enrollment number: [id number]. Account: [facility]. Site auth number: [id number]

## 2021-04-19 ENCOUNTER — Other Ambulatory Visit: Payer: Self-pay | Admitting: Certified Nurse Midwife

## 2021-04-19 DIAGNOSIS — G35 Multiple sclerosis: Secondary | ICD-10-CM | POA: Diagnosis not present

## 2021-05-17 DIAGNOSIS — G35 Multiple sclerosis: Secondary | ICD-10-CM | POA: Diagnosis not present

## 2021-05-18 ENCOUNTER — Other Ambulatory Visit: Payer: Self-pay | Admitting: Certified Nurse Midwife

## 2021-06-05 ENCOUNTER — Telehealth: Payer: 59 | Admitting: Nurse Practitioner

## 2021-06-05 DIAGNOSIS — N3000 Acute cystitis without hematuria: Secondary | ICD-10-CM | POA: Diagnosis not present

## 2021-06-05 MED ORDER — NITROFURANTOIN MONOHYD MACRO 100 MG PO CAPS
100.0000 mg | ORAL_CAPSULE | Freq: Two times a day (BID) | ORAL | 0 refills | Status: AC
Start: 2021-06-05 — End: 2021-06-10

## 2021-06-05 NOTE — Progress Notes (Signed)
E-Visit for Urinary Problems  We are sorry that you are not feeling well.  Here is how we plan to help!  Based on what you shared with me it looks like you most likely have a simple urinary tract infection.  A UTI (Urinary Tract Infection) is a bacterial infection of the bladder.  Most cases of urinary tract infections are simple to treat but a key part of your care is to encourage you to drink plenty of fluids and watch your symptoms carefully.  I have prescribed MacroBid 100 mg twice a day for 5 days.  Your symptoms should gradually improve. Call us if the burning in your urine worsens, you develop worsening fever, back pain or pelvic pain or if your symptoms do not resolve after completing the antibiotic.  Urinary tract infections can be prevented by drinking plenty of water to keep your body hydrated.  Also be sure when you wipe, wipe from front to back and don't hold it in!  If possible, empty your bladder every 4 hours.  HOME CARE Drink plenty of fluids Compete the full course of the antibiotics even if the symptoms resolve Remember, when you need to go.go. Holding in your urine can increase the likelihood of getting a UTI! GET HELP RIGHT AWAY IF: You cannot urinate You get a high fever Worsening back pain occurs You see blood in your urine You feel sick to your stomach or throw up You feel like you are going to pass out  MAKE SURE YOU  Understand these instructions. Will watch your condition. Will get help right away if you are not doing well or get worse.  Meds ordered this encounter  Medications   nitrofurantoin, macrocrystal-monohydrate, (MACROBID) 100 MG capsule    Sig: Take 1 capsule (100 mg total) by mouth 2 (two) times daily for 5 days.    Dispense:  10 capsule    Refill:  0    Thank you for choosing an e-visit.  Your e-visit answers were reviewed by a board certified advanced clinical practitioner to complete your personal care plan. Depending upon the  condition, your plan could have included both over the counter or prescription medications.  Please review your pharmacy choice. Make sure the pharmacy is open so you can pick up prescription now. If there is a problem, you may contact your provider through [employer] and have the prescription routed to another pharmacy.  Your safety is important to [place]. If you have drug allergies check your prescription carefully.   For the next 24 hours you can use MyChart to ask questions about today's visit, request a non-urgent call back, or ask for a work or school excuse. You will get an email in the next two days asking about your experience. I hope that your e-visit has been valuable and will speed your recovery.    I spent approximately 5 minutes reviewing the patient's chart and coordinating her care.

## 2021-06-12 ENCOUNTER — Encounter: Payer: 59 | Admitting: Certified Nurse Midwife

## 2021-06-26 ENCOUNTER — Encounter: Payer: 59 | Admitting: Certified Nurse Midwife

## 2021-06-26 DIAGNOSIS — G35 Multiple sclerosis: Secondary | ICD-10-CM | POA: Diagnosis not present

## 2021-06-29 ENCOUNTER — Other Ambulatory Visit: Payer: Self-pay

## 2021-06-29 ENCOUNTER — Encounter: Payer: Self-pay | Admitting: Obstetrics and Gynecology

## 2021-06-29 ENCOUNTER — Ambulatory Visit: Payer: 59 | Admitting: Obstetrics and Gynecology

## 2021-06-29 VITALS — BP 125/83 | HR 94 | Ht 62.0 in | Wt 208.8 lb

## 2021-06-29 DIAGNOSIS — N9489 Other specified conditions associated with female genital organs and menstrual cycle: Secondary | ICD-10-CM

## 2021-06-29 NOTE — Progress Notes (Signed)
    GYNECOLOGY PROGRESS NOTE  Subjective:    Patient ID: Tracey Lindsey, female    DOB: [birth year], 30 y.o.   MRN: [id number]  HPI  Patient is a 30 y.o. G37P0010 female who presents for vaginal swelling. Swelling and firmness on the right side of her vagina. Labia is also swollen. Onset ~ 5 months. She denies pain and redness. Does feel decreased sensation on this side as well.   The following portions of the patient's history were reviewed and updated as appropriate: allergies, current medications, past family history, past medical history, past social history, past surgical history, and problem list.  Review of Systems Pertinent items noted in HPI and remainder of comprehensive ROS otherwise negative.   Objective:   Blood pressure 125/83, pulse 94, height 5\' 2"  (1.575 m), weight 208 lb 12.8 oz (94.7 kg), last menstrual period 05/24/2021, SpO2 (!) 16 %. Body mass index is 38.19 kg/m. General appearance: alert and no distress Abdomen: soft, non-tender; bowel sounds normal; no masses,  no organomegaly Pelvic:  External genitalia appears normal, mild laxity of skin of right vulva (including labia minora and clitoral hood). No masses or tenderness appreciated.  Extremities: extremities normal, atraumatic, no cyanosis or edema Neurologic: Grossly normal   Assessment:   1. Labial swelling      Plan:   Discussed with patient that there was no actual swelling of her vulva.  There was however observed to be a very minuscule difference in the laxity of her tissues on the right versus the left, slightly more pronounced when standing versus lying down.  I discussed that while this may be cosmetically unappealing to her it was hardly noticeable, and would not lead to any Hulsey-term issues.  Allowed patient to visualize differences with handheld mirror in different positions.  Patient reassured.  To follow-up as needed.   05/26/2021. [facility]

## 2021-07-24 DIAGNOSIS — G35 Multiple sclerosis: Secondary | ICD-10-CM | POA: Diagnosis not present

## 2021-08-14 NOTE — Progress Notes (Addendum)
Chief Complaint  Patient presents with   Follow-up    Emg 4., alone. Here for 6 month MS f/u, on Tysabri. Pt report hand and leg numbness are  more often. Tender on top of her head, can cause HA. Overall stable.      HISTORY OF PRESENT ILLNESS:  08/15/21 ALL:  Tracey Lindsey is a 30 y.o. female here today for follow up for RRMS. She continues Tysabri. Next infusion 9/19. Last JCV 0.14, negative. MRI brain and cervical spine stable 02/2020.   She feels that she is doing fairly well. No changes with gait or vision. Recent eye exam was unchanged. She works full time. No falls.   She has noticed both hands seem to fall asleep when she is using her phone. Position changes resolve symptoms.   She has had some tenderness of the top of her head. She feels that right at the crown of her head feels tender to touch. She noticed her head felt tender when having braids placed in her hair in July but tenderness of top of scalp notable for past 3 days.   She continues lorazepam 0.5mg  BID, PRN, 15 tablets last refilled 02/07/2021. This helps with anxiety and she takes sparingly. Mood is good. She is resting well.    HISTORY (copied from Dr Bonnita Hollow previous note)  Tracey Lindsey is a 30 y.o. woman recently diagnosed with MS.      Update 02/03/2020: She is on Tysabri,   She feels stable and has no major side effects.  She notes a little hair thinning  No recent exacerbation and no new symptoms.  She is JCV Ab negative (0.12) last checked September 2021.   Gait is doing well and she can walk a couple miles without a rest..   Balance is doing ok but she uses the bannister on stairs..    She denies weakness.  She has occasional left foot numbness, worse if sitting on a toilet.   Bladder function is the same with some frequency and 2 - 3x nocturia .  She has no incontinence.  She prefers to hold off on a medication at thtis time.  Marland Kitchen  She wakes up 2-3 times nightly to urinate.      Vision is the same --- right  eye blurry but left and binocular is 20/20.     She notes some fatigue that worsens as day goes on.    She gets everything done during the day that she needs to do.   She usually  sleeps well and quickly falls back to sleep after nocturia.  She denies depression but has mild anxiety.  She rarely takes a lorazepam, usually if she has a lot of thoughts at night and gets insomnia.  Cognition is doing well.      We discussed Tysabri and pregnancy   She has done the Covid-19 vaccine and booster (Neurosurgeon).    MS History:    In retrospect, she had an episode of vertigo in 2014 with mild vision changes that improved.    In March 2016, she had vertigo that improved after a couple weeks.  In late April, she had  numbness in the left foot. When she woke up the next morning, she noted that the numbness was higher up the left arm and the next day it also involved the left hand and she went to the emergency room. In the emergency room, she had an MRI of the brain that showed white  matter foci were some for multiple sclerosis.      IMAGING MRI of the brain with and without contrast 03/29/15 showed several posterior fossa lesions with one in the right middle cerebellar peduncle and several in the pons.   The middle cerebellar peduncle focus and 2 of the pontine foci enhanced. There are also several periventricular and deep white matter foci in the cerebral hemispheres.   MRI cervical spine shows one enhancing focus.  Repeat imaging of the brain and cervical spine November 2017 did not show any new lesions. The previously enhancing lesions were smaller and did not enhance.Marland Kitchen      MRI of the brain 02/17/2020 showed Scattered T2/FLAIR hyperintense foci in the hemispheres.  Though nonspecific, the foci are consistent with the diagnosis of multiple sclerosis.  None of the foci appear to be acute and there are no new lesions compared to the 2019 MRI.   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  This could be incidental or be associated with elevated intracranial pressure.   MRi of the cervical spiine 02/17/2020 showed T2 hyperintense focus within the spinal cord to the left adjacent to C3-C4.  This was also present on the 2017 MRI and is consistent with a chronic demyelinating plaque associated with multiple sclerosis.  There are no degenerative changes noted   REVIEW OF SYSTEMS: Out of a complete 14 system review of symptoms, the patient complains only of the following symptoms, tenderness of scalp, tingling of bilateral hands, anxiety and all other reviewed systems are negative.   ALLERGIES: No Known Allergies   HOME MEDICATIONS: Outpatient Medications Prior to Visit  Medication Sig Dispense Refill   Ascorbic Acid (VITAMIN C PO) Take by mouth.     Cholecalciferol (VITAMIN D3) LIQD 2,000 Units by Does not apply route.     LORazepam (ATIVAN) 0.5 MG tablet Take 1 tablet (0.5 mg total) by mouth 2 (two) times daily as needed for anxiety. 15 tablet 1   natalizumab (TYSABRI) 300 MG/15ML injection Inject 15 mLs into the vein every 30 (thirty) days.      NIKKI 3-0.02 MG tablet TAKE 1 TABLET BY MOUTH EVERY DAY 84 tablet 3   Facility-Administered Medications Prior to Visit  Medication Dose Route Frequency Provider Last Rate Last Admin   gadopentetate dimeglumine (MAGNEVIST) injection 18 mL  18 mL Intravenous Once PRN Sater, Pearletha Furl, MD         PAST MEDICAL HISTORY: Past Medical History:  Diagnosis Date   Anxiety    Chlamydia    Depression    History of abnormal cervical Papanicolaou smear    MS (multiple sclerosis) (HCC) April 2016   PCOS (polycystic ovarian syndrome)    Vitamin D deficiency      PAST SURGICAL HISTORY: Past Surgical History:  Procedure Laterality Date   NO PAST SURGERIES       FAMILY HISTORY: Family History  Problem Relation Age of Onset   Diabetes Father    Hypertension Father    Stroke Father    Cancer Mother        lung    Hyperlipidemia Mother    Lung disease Mother    Eczema Brother    Colon cancer Maternal Aunt    Other Maternal Grandmother    Cancer Maternal Grandmother    Breast cancer Neg Hx    Ovarian cancer Neg Hx      SOCIAL HISTORY: Social History   Socioeconomic History   Marital status:  Single    Spouse name: Not on file   Number of children: Not on file   Years of education: 60   Highest education level: Master's degree (e.g., MA, MS, MEng, MEd, MSW, MBA)  Occupational History   Occupation: Visual merchandiser  Tobacco Use   Smoking status: Never   Smokeless tobacco: Never  Vaping Use   Vaping Use: Never used  Substance and Sexual Activity   Alcohol use: Not Currently    Comment: occasionally   Drug use: Never   Sexual activity: Yes    Partners: Male    Birth control/protection: None  Other Topics Concern   Not on file  Social History Narrative   Lives with mother    Drinks soda daily (8-10 ounces)   Social Determinants of Health   Financial Resource Strain: Not on file  Food Insecurity: Not on file  Transportation Needs: Not on file  Physical Activity: Not on file  Stress: Not on file  Social Connections: Not on file  Intimate Partner Violence: Not on file     PHYSICAL EXAM  Vitals:   08/15/21 1332  BP: 124/84  Pulse: 75  Weight: 216 lb 9.6 oz (98.2 kg)  Height: 5\' 2"  (1.575 m)   Body mass index is 39.62 kg/m.  Generalized: Well developed, in no acute distress  Cardiology: normal rate and rhythm, no murmur auscultated  Respiratory: clear to auscultation bilaterally    Neurological examination  Mentation: Alert oriented to time, place, history taking. Follows all commands speech and language fluent Cranial nerve II-XII: Pupils were equal round reactive to light. Extraocular movements were full, visual field were full on confrontational test. Facial sensation and strength were normal. Uvula tongue midline. Head turning and shoulder shrug  were normal  and symmetric. Motor: The motor testing reveals 5 over 5 strength of all 4 extremities. Good symmetric motor tone is noted throughout.  Sensory: Sensory testing is intact to soft touch on all 4 extremities. No evidence of extinction is noted.  Coordination: Cerebellar testing reveals good finger-nose-finger and heel-to-shin bilaterally.  Gait and station: Gait is normal.  Reflexes: Deep tendon reflexes are symmetric and normal bilaterally.    DIAGNOSTIC DATA (LABS, IMAGING, TESTING) - I reviewed patient records, labs, notes, testing and imaging myself where available.  Lab Results  Component Value Date   WBC 9.9 02/07/2021   HGB 12.8 02/07/2021   HCT 38.0 02/07/2021   MCV 91 02/07/2021   PLT 215 02/07/2021      Component Value Date/Time   NA 138 08/09/2020 1446   K 4.3 08/09/2020 1446   CL 102 08/09/2020 1446   CO2 23 08/09/2020 1446   GLUCOSE 79 08/09/2020 1446   GLUCOSE 84 05/30/2020 1512   BUN 11 08/09/2020 1446   CREATININE 0.69 08/09/2020 1446   CREATININE 0.72 05/30/2020 1512   CALCIUM 9.1 08/09/2020 1446   PROT 6.7 08/09/2020 1446   ALBUMIN 4.4 08/09/2020 1446   AST 18 08/09/2020 1446   ALT 20 08/09/2020 1446   ALKPHOS 55 08/09/2020 1446   BILITOT 0.2 08/09/2020 1446   GFRNONAA 118 08/09/2020 1446   GFRNONAA 114 05/30/2020 1512   GFRAA 136 08/09/2020 1446   GFRAA 132 05/30/2020 1512   Lab Results  Component Value Date   CHOL 193 05/30/2020   HDL 55 05/30/2020   LDLCALC 112 (H) 05/30/2020   TRIG 148 05/30/2020   CHOLHDL 3.5 05/30/2020   Lab Results  Component Value Date   HGBA1C 5.4 05/30/2020  Lab Results  Component Value Date   VITAMINB12 258 02/03/2020   Lab Results  Component Value Date   TSH 1.630 07/14/2019    No flowsheet data found.   No flowsheet data found.   ASSESSMENT AND PLAN  30 y.o. year old female  has a past medical history of Anxiety, Chlamydia, Depression, History of abnormal cervical Papanicolaou smear, MS (multiple  sclerosis) (HCC) (April 2016), PCOS (polycystic ovarian syndrome), and Vitamin D deficiency. here with    Multiple sclerosis (HCC)  High risk medication use  Depression with anxiety  Vitamin D deficiency   Tracey Lindsey is doing well, today. We will continue Tysabri infusions every 4 weeks. I will update labs. We will order MRI brain and cervical spine for monitoring. I do not feel scalp tenderness or bilateral hand paresthesias are related to exacerbation but we will continue to monitor closely. She will continue PRN use of lorazepam. Healthy lifestyle habits encouraged. She will return to see Dr Epimenio Foot in 6 months.   No orders of the defined types were placed in this encounter.    No orders of the defined types were placed in this encounter.     Shawnie Dapper, MSN, FNP-C 08/15/2021, 1:37 PM  [facility] [address], [address] [place], [place] [address] [phone]   I have read the note, and I agree with the clinical assessment and plan.  Richard A. Epimenio Foot, MD, PhD, [facility] Certified in Neurology, Clinical Neurophysiology, Sleep Medicine, Pain Medicine and Neuroimaging  [facility] [address], [address] [place], [place] [address] [phone]

## 2021-08-15 ENCOUNTER — Telehealth: Payer: Self-pay | Admitting: Neurology

## 2021-08-15 ENCOUNTER — Other Ambulatory Visit: Payer: Self-pay

## 2021-08-15 ENCOUNTER — Ambulatory Visit: Payer: 59 | Admitting: Family Medicine

## 2021-08-15 ENCOUNTER — Encounter: Payer: Self-pay | Admitting: Family Medicine

## 2021-08-15 VITALS — BP 124/84 | HR 75 | Ht 62.0 in | Wt 216.6 lb

## 2021-08-15 DIAGNOSIS — G35 Multiple sclerosis: Secondary | ICD-10-CM

## 2021-08-15 DIAGNOSIS — Z79899 Other long term (current) drug therapy: Secondary | ICD-10-CM

## 2021-08-15 DIAGNOSIS — E559 Vitamin D deficiency, unspecified: Secondary | ICD-10-CM

## 2021-08-15 DIAGNOSIS — F418 Other specified anxiety disorders: Secondary | ICD-10-CM

## 2021-08-15 MED ORDER — LORAZEPAM 0.5 MG PO TABS: 0.5000 mg | ORAL_TABLET | Freq: Two times a day (BID) | ORAL | 1 refills | Status: DC | PRN

## 2021-08-15 NOTE — Patient Instructions (Signed)
Below is our plan:  We will continue current treatment plan. I will update your labs and MRI. Please listen out to schedule.   Please make sure you are staying well hydrated. I recommend 50-60 ounces daily. Well balanced diet and regular exercise encouraged. Consistent sleep schedule with 6-8 hours recommended.   Please continue follow up with care team as directed.   Follow up with Dr Epimenio Foot in 6 months   You may receive a survey regarding today's visit. I encourage you to leave honest feed back as I do use this information to improve patient care. Thank you for seeing me today!

## 2021-08-15 NOTE — Telephone Encounter (Signed)
Placed [facility] lab in [facility] lock box for routine lab pick up. Results pending. 

## 2021-08-16 LAB — CBC WITH DIFFERENTIAL/PLATELET
Basophils Absolute: 0.1 10*3/uL (ref 0.0–0.2)
Basos: 1 %
EOS (ABSOLUTE): 0.2 10*3/uL (ref 0.0–0.4)
Eos: 2 %
Hematocrit: 37.1 % (ref 34.0–46.6)
Hemoglobin: 12.5 g/dL (ref 11.1–15.9)
Immature Grans (Abs): 0 10*3/uL (ref 0.0–0.1)
Immature Granulocytes: 0 %
Lymphocytes Absolute: 4.3 10*3/uL — ABNORMAL HIGH (ref 0.7–3.1)
Lymphs: 42 %
MCH: 30 pg (ref 26.6–33.0)
MCHC: 33.7 g/dL (ref 31.5–35.7)
MCV: 89 fL (ref 79–97)
Monocytes Absolute: 0.7 10*3/uL (ref 0.1–0.9)
Monocytes: 7 %
Neutrophils Absolute: 5 10*3/uL (ref 1.4–7.0)
Neutrophils: 48 %
Platelets: 174 10*3/uL (ref 150–450)
RBC: 4.16 x10E6/uL (ref 3.77–5.28)
RDW: 13 % (ref 11.7–15.4)
WBC: 10.4 10*3/uL (ref 3.4–10.8)

## 2021-08-16 LAB — VITAMIN D 25 HYDROXY (VIT D DEFICIENCY, FRACTURES): Vit D, 25-Hydroxy: 22.7 ng/mL — ABNORMAL LOW (ref 30.0–100.0)

## 2021-08-21 DIAGNOSIS — G35 Multiple sclerosis: Secondary | ICD-10-CM | POA: Diagnosis not present

## 2021-08-23 NOTE — Telephone Encounter (Signed)
JCV came back index value is 0.15 and the antibody is negative. Will let Dr Epimenio Foot review.

## 2021-08-24 ENCOUNTER — Telehealth: Payer: Self-pay | Admitting: Family Medicine

## 2021-08-24 NOTE — Telephone Encounter (Signed)
Patient returned my call she is scheduled at [facility] for 08/30/19.

## 2021-08-24 NOTE — Telephone Encounter (Signed)
LVM for pt to call back to schedule  cnoe [facility] auth: [employer] spoke to [place] Ref # [id number]

## 2021-08-29 ENCOUNTER — Ambulatory Visit: Payer: 59

## 2021-08-29 DIAGNOSIS — G35 Multiple sclerosis: Secondary | ICD-10-CM

## 2021-08-29 DIAGNOSIS — Z79899 Other long term (current) drug therapy: Secondary | ICD-10-CM

## 2021-08-29 MED ORDER — GADOBENATE DIMEGLUMINE 529 MG/ML IV SOLN
20.0000 mL | Freq: Once | INTRAVENOUS | Status: AC | PRN
  Administered 2021-08-29: 20 mL via INTRAVENOUS

## 2021-09-15 ENCOUNTER — Encounter: Payer: 59 | Admitting: Certified Nurse Midwife

## 2021-09-18 DIAGNOSIS — G35 Multiple sclerosis: Secondary | ICD-10-CM | POA: Diagnosis not present

## 2021-09-20 ENCOUNTER — Telehealth: Payer: Self-pay | Admitting: Family Medicine

## 2021-09-20 NOTE — Telephone Encounter (Signed)
FYI- Renay(NURSE Case Manager) with [facility] called to inform she is the pt's advocate for what ever it is that she needs and if we have troubles with getting approvals she is here to help. Her call back number is [phone] ext [phone]

## 2021-09-20 NOTE — Telephone Encounter (Signed)
noted 

## 2021-10-10 NOTE — Progress Notes (Signed)
    GYNECOLOGY PROGRESS NOTE  Subjective:    Patient ID: Tracey Lindsey, female    DOB: [birth year], 30 y.o.   MRN: [id number]  HPI  Patient is a 30 y.o. G24P0010 female who presents for STD screening. Denies complaints or recent exposure. Last coitus was in May. States she desires to know her status.  Also complains about a bump on her left labia. Has been there ~ 2 weeks.   The following portions of the patient's history were reviewed and updated as appropriate: allergies, current medications, past family history, past medical history, past social history, past surgical history, and problem list.  Review of Systems Pertinent items noted in HPI and remainder of comprehensive ROS otherwise negative.   Objective:   BP 121/79 (BP Location: Left Arm, Patient Position: Sitting, Cuff Size: Normal)   Pulse 82   Ht 5\' 2"  (1.575 m)   Wt 225 lb 4.8 oz (102.2 kg)   LMP 09/15/2021 (Exact Date)   BMI 41.21 kg/m   General appearance: alert, cooperative, appears stated age, and no distress Abdomen: soft, non-tender; bowel sounds normal; no masses,  no organomegaly Pelvic: external genitalia normal, small inclusion cyst on left labia majora (not visible but palpable). Rectovaginal septum normal.  Blind swab performed.    Assessment:   1. Screening examination for STD (sexually transmitted disease)   2. Inclusion cyst of vulva      Plan:   1. Screening examination for STD (sexually transmitted disease)  - NuSwab Vaginitis Plus (VG+) - Hepatitis C antibody - Hepatitis B surface antigen - HIV Antibody (routine testing w rflx)  2. Inclusion cyst of vulva  - Given reassurance. No intervention needed.       09/17/2021, MD [facility]

## 2021-10-11 ENCOUNTER — Encounter: Payer: Self-pay | Admitting: Obstetrics and Gynecology

## 2021-10-11 ENCOUNTER — Ambulatory Visit: Payer: 59 | Admitting: Obstetrics and Gynecology

## 2021-10-11 ENCOUNTER — Other Ambulatory Visit: Payer: Self-pay

## 2021-10-11 VITALS — BP 121/79 | HR 82 | Ht 62.0 in | Wt 225.3 lb

## 2021-10-11 DIAGNOSIS — Z113 Encounter for screening for infections with a predominantly sexual mode of transmission: Secondary | ICD-10-CM | POA: Diagnosis not present

## 2021-10-11 DIAGNOSIS — N907 Vulvar cyst: Secondary | ICD-10-CM | POA: Diagnosis not present

## 2021-10-11 NOTE — Patient Instructions (Signed)
Epidermoid Cyst  An epidermoid cyst, also known as epidermal cyst, is a sac made of skin tissue. The sac contains a substance called keratin. Keratin is a protein that is normally secreted through the hair follicles. When keratin becomes trapped in the top layer of skin (epidermis), it can form an epidermoid cyst. Epidermoid cysts can be found anywhere on your body. These cysts are usually harmless (benign), and they may not cause symptoms unless they become inflamed or infected. What are the causes? This condition may be caused by: A blocked hair follicle. A hair that curls and re-enters the skin instead of growing straight out of the skin (ingrown hair). A blocked pore. Irritated skin. An injury to the skin. Certain conditions that are passed along from parent to child (inherited). Human papillomavirus (HPV). This happens rarely when cysts occur on the bottom of the feet. Fantini-term (chronic) sun damage to the skin. What increases the risk? The following factors may make you more likely to develop an epidermoid cyst: Having acne. Being female. Having an injury to the skin. Being past puberty. Having certain rare genetic disorders. What are the signs or symptoms? The only symptom of this condition may be a small, painless lump underneath the skin. When an epidermal cyst ruptures, it may become inflamed. True infection in cysts is rare. Symptoms may include: Redness. Inflammation. Tenderness. Warmth. Keratin draining from the cyst. Keratin is grayish-white, bad-smelling substance. Pus draining from the cyst. How is this diagnosed? This condition is diagnosed with a physical exam. In some cases, you may have a sample of tissue (biopsy) taken from your cyst to be examined under a microscope or tested for bacteria. You may be referred to a health care provider who specializes in skin care (dermatologist). How is this treated? If a cyst becomes inflamed, treatment may include: Opening and  draining the cyst, done by a health care provider. After draining, minor surgery to remove the rest of the cyst may be done. Taking antibiotic medicine. Having injections of medicines (steroids) that help to reduce inflammation. Having surgery to remove the cyst. Surgery may be done if the cyst: Becomes large. Bothers you. Has a chance of turning into cancer. Do not try to open a cyst yourself. Follow these instructions at home: Medicines If you were prescribed an antibiotic medicine, take it it as told by your health care provider. Do not stop using the antibiotic even if you start to feel better. Take over-the-counter and prescription medicines only as told by your health care provider. General instructions Keep the area around your cyst clean and dry. Wear loose, dry clothing. Avoid touching your cyst. Check your cyst every day for signs of infection. Check for: Redness, swelling, or pain. Fluid or blood. Warmth. Pus or a bad smell. Keep all follow-up visits. This is important. How is this prevented? Wear clean, dry, clothing. Avoid wearing tight clothing. Keep your skin clean and dry. Take showers or baths every day. Contact a health care provider if: Your cyst develops symptoms of infection. Your condition is not improving or is getting worse. You develop a cyst that looks different from other cysts you have had. You have a fever. Get help right away if: Redness spreads from the cyst into the surrounding area. Summary An epidermoid cyst is a sac made of skin tissue. These cysts are usually harmless (benign), and they may not cause symptoms unless they become inflamed. If a cyst becomes inflamed, treatment may include surgery to open and drain the   cyst, or to remove it. Treatment may also include medicines by mouth or through an injection. Take over-the-counter and prescription medicines only as told by your health care provider. If you were prescribed an antibiotic medicine,  take it as told by your health care provider. Do not stop using the antibiotic even if you start to feel better. Contact a health care provider if your condition is not improving or is getting worse. Keep all follow-up visits as told by your health care provider. This is important. This information is not intended to replace advice given to you by your health care provider. Make sure you discuss any questions you have with your healthcare provider. Document Revised: 02/24/2020 Document Reviewed: 02/24/2020 Elsevier Patient Education  2022 Elsevier Inc.  

## 2021-10-11 NOTE — Addendum Note (Signed)
Addended by: Silvano Bilis on: 10/11/2021 04:34 PM   Modules accepted: Orders

## 2021-10-11 NOTE — Addendum Note (Signed)
Addended by: Smith Mince on: 10/11/2021 04:53 PM   Modules accepted: Orders

## 2021-10-11 NOTE — Addendum Note (Signed)
Addended by: Silvano Bilis on: 10/11/2021 04:36 PM   Modules accepted: Orders

## 2021-10-12 LAB — HIV ANTIBODY (ROUTINE TESTING W REFLEX): HIV Screen 4th Generation wRfx: NONREACTIVE

## 2021-10-13 LAB — NUSWAB VAGINITIS PLUS (VG+)
Candida albicans, NAA: NEGATIVE
Candida glabrata, NAA: NEGATIVE
Chlamydia trachomatis, NAA: NEGATIVE
Neisseria gonorrhoeae, NAA: NEGATIVE
Trich vag by NAA: NEGATIVE

## 2021-10-16 DIAGNOSIS — G35 Multiple sclerosis: Secondary | ICD-10-CM | POA: Diagnosis not present

## 2021-11-08 ENCOUNTER — Other Ambulatory Visit: Payer: Self-pay

## 2021-11-08 ENCOUNTER — Other Ambulatory Visit (HOSPITAL_COMMUNITY)
Admission: RE | Admit: 2021-11-08 | Discharge: 2021-11-08 | Disposition: A | Discharge status: Home / Self Care | Payer: 59 | Source: Ambulatory Visit | Attending: Obstetrics and Gynecology | Admitting: Obstetrics and Gynecology

## 2021-11-08 ENCOUNTER — Ambulatory Visit: Payer: 59 | Admitting: Obstetrics and Gynecology

## 2021-11-08 ENCOUNTER — Encounter: Payer: Self-pay | Admitting: Obstetrics and Gynecology

## 2021-11-08 DIAGNOSIS — R300 Dysuria: Secondary | ICD-10-CM | POA: Diagnosis not present

## 2021-11-08 DIAGNOSIS — N898 Other specified noninflammatory disorders of vagina: Secondary | ICD-10-CM | POA: Diagnosis not present

## 2021-11-08 LAB — POCT URINALYSIS DIPSTICK
Bilirubin, UA: NEGATIVE
Blood, UA: NEGATIVE
Glucose, UA: NEGATIVE
Ketones, UA: NEGATIVE
Leukocytes, UA: NEGATIVE
Nitrite, UA: POSITIVE
Protein, UA: NEGATIVE
Spec Grav, UA: 1.01
Urobilinogen, UA: 0.2 U/dL
pH, UA: 6

## 2021-11-08 NOTE — Progress Notes (Signed)
She has been having your symptoms of UTI x 1 week. She had been treating symptoms with Dmanoose. She also has had unprotected sex and wanted to be tested for G/C. She denied having any symptoms.

## 2021-11-08 NOTE — Patient Instructions (Signed)

## 2021-11-09 ENCOUNTER — Other Ambulatory Visit: Payer: Self-pay

## 2021-11-09 ENCOUNTER — Telehealth: Payer: Self-pay | Admitting: Obstetrics and Gynecology

## 2021-11-09 ENCOUNTER — Other Ambulatory Visit (HOSPITAL_COMMUNITY): Payer: Self-pay

## 2021-11-09 DIAGNOSIS — R300 Dysuria: Secondary | ICD-10-CM | POA: Diagnosis not present

## 2021-11-09 MED ORDER — SULFAMETHOXAZOLE-TRIMETHOPRIM 800-160 MG PO TABS: 1.0000 | ORAL_TABLET | Freq: Two times a day (BID) | ORAL | 0 refills | Status: DC

## 2021-11-09 MED ORDER — SULFAMETHOXAZOLE-TRIMETHOPRIM 800-160 MG PO TABS
1.0000 | ORAL_TABLET | Freq: Two times a day (BID) | ORAL | 0 refills | Status: AC
  Filled 2021-11-09: qty 14, 7d supply, fill #0

## 2021-11-09 NOTE — Addendum Note (Signed)
Addended by: Tommie Raymond on: 11/09/2021 10:33 AM   Modules accepted: Orders

## 2021-11-09 NOTE — Telephone Encounter (Signed)
Pt called asking if medication has been called in from labs collected yesterday. Made pt aware no medication has been sent in yet, she requested rx be sent to [facility]- [address] [place], [place].

## 2021-11-09 NOTE — Telephone Encounter (Signed)
Called [facility] d/c rx for Bactrim, sent rx to [facility].

## 2021-11-10 LAB — CERVICOVAGINAL ANCILLARY ONLY
Bacterial Vaginitis (gardnerella): NEGATIVE
Candida Glabrata: NEGATIVE
Candida Vaginitis: NEGATIVE
Comment: NEGATIVE
Comment: NEGATIVE
Comment: NEGATIVE

## 2021-11-12 ENCOUNTER — Other Ambulatory Visit: Payer: Self-pay | Admitting: Obstetrics and Gynecology

## 2021-11-12 DIAGNOSIS — Z113 Encounter for screening for infections with a predominantly sexual mode of transmission: Secondary | ICD-10-CM

## 2021-11-14 LAB — URINE CULTURE

## 2021-11-15 ENCOUNTER — Encounter: Payer: Self-pay | Admitting: Obstetrics and Gynecology

## 2021-11-20 DIAGNOSIS — G35 Multiple sclerosis: Secondary | ICD-10-CM | POA: Diagnosis not present

## 2021-12-05 ENCOUNTER — Telehealth: Payer: Self-pay | Admitting: Obstetrics and Gynecology

## 2021-12-05 NOTE — Telephone Encounter (Signed)
Pt called asking for her insurance be refilled for her last apt on 12-07.

## 2021-12-19 DIAGNOSIS — G35 Multiple sclerosis: Secondary | ICD-10-CM | POA: Diagnosis not present

## 2021-12-20 ENCOUNTER — Other Ambulatory Visit: Payer: Self-pay

## 2021-12-20 ENCOUNTER — Other Ambulatory Visit: Payer: Self-pay | Admitting: Obstetrics and Gynecology

## 2021-12-20 ENCOUNTER — Ambulatory Visit (INDEPENDENT_AMBULATORY_CARE_PROVIDER_SITE_OTHER): Payer: 59 | Admitting: Obstetrics and Gynecology

## 2021-12-20 ENCOUNTER — Encounter: Payer: Self-pay | Admitting: Obstetrics and Gynecology

## 2021-12-20 ENCOUNTER — Other Ambulatory Visit (HOSPITAL_COMMUNITY)
Admission: RE | Admit: 2021-12-20 | Discharge: 2021-12-20 | Disposition: A | Discharge status: Home / Self Care | Payer: 59 | Source: Ambulatory Visit | Attending: Obstetrics and Gynecology | Admitting: Obstetrics and Gynecology

## 2021-12-20 VITALS — BP 113/79 | HR 103 | Ht 62.0 in | Wt 220.0 lb

## 2021-12-20 DIAGNOSIS — Z113 Encounter for screening for infections with a predominantly sexual mode of transmission: Secondary | ICD-10-CM

## 2021-12-20 DIAGNOSIS — Z3009 Encounter for other general counseling and advice on contraception: Secondary | ICD-10-CM | POA: Diagnosis not present

## 2021-12-20 DIAGNOSIS — Z23 Encounter for immunization: Secondary | ICD-10-CM | POA: Diagnosis not present

## 2021-12-20 DIAGNOSIS — F419 Anxiety disorder, unspecified: Secondary | ICD-10-CM

## 2021-12-20 DIAGNOSIS — Z6841 Body Mass Index (BMI) 40.0 and over, adult: Secondary | ICD-10-CM | POA: Diagnosis not present

## 2021-12-20 DIAGNOSIS — F32A Depression, unspecified: Secondary | ICD-10-CM | POA: Diagnosis not present

## 2021-12-20 DIAGNOSIS — Z01419 Encounter for gynecological examination (general) (routine) without abnormal findings: Secondary | ICD-10-CM

## 2021-12-20 LAB — POCT URINALYSIS DIPSTICK OB

## 2021-12-20 MED ORDER — NORETHIN-ETH ESTRAD-FE BIPHAS 1 MG-10 MCG / 10 MCG PO TABS: 1.0000 | ORAL_TABLET | Freq: Every day | ORAL | 3 refills | Status: DC

## 2021-12-20 NOTE — Progress Notes (Signed)
GYNECOLOGY ANNUAL PHYSICAL EXAM PROGRESS NOTE  Subjective:    Tracey Lindsey is a 31 y.o. G32P0010 female who presents for an annual exam. The patient has no complaints today. The patient is sexually active. The patient participates in regular exercise: not frequently. Has the patient ever been transfused or tattooed?: no. The patient reports that there is not domestic violence in her life.   Desires screening for STDs today.  Also reports that she completed antibiotics for recent UTI.  Desires to discuss contraception as she has recently begun being sexually active again. Tried using Phexxi once but did not care for it.    Menstrual History: Menarche age: 17 Patient's last menstrual period was 12/12/2021. Period Cycle (Days): 28 Period Duration (Days): 4-5 Period Pattern: Regular Menstrual Flow: Moderate, Light Menstrual Control: Maxi pad, Tampon Menstrual Control Change Freq (Hours): 4-5 Dysmenorrhea: (!) Mild Dysmenorrhea Symptoms: Cramping   Gynecologic History:  Contraception: none History of STI's: Denies Last Pap: 08/28/2019. Results were: normal.  Remote h/o abnormal pap smears.    Upstream - 12/20/21 0830       Pregnancy Intention Screening   Does the patient want to become pregnant in the next year? No    Does the patient's partner want to become pregnant in the next year? No    Would the patient like to discuss contraceptive options today? Yes      Contraception Wrap Up   Current Method No Method - Other Reason    End Method Oral Contraceptive    Contraception Counseling Provided Yes            The pregnancy intention screening data noted above was reviewed. Potential methods of contraception were discussed. The patient elected to proceed with Oral Contraceptive.   OB History  Gravida Para Term Preterm AB Living  1 0 0 0 1 0  SAB IAB Ectopic Multiple Live Births  0 1 0 0 0    # Outcome Date GA Lbr Len/2nd Weight Sex Delivery Anes PTL Lv  1 IAB  2017            Past Medical History:  Diagnosis Date   Anxiety    Chlamydia    Depression    History of abnormal cervical Papanicolaou smear    MS (multiple sclerosis) (HCC) April 2016   PCOS (polycystic ovarian syndrome)    Vitamin D deficiency     Past Surgical History:  Procedure Laterality Date   NO PAST SURGERIES      Family History  Problem Relation Age of Onset   Diabetes Father    Hypertension Father    Stroke Father    Cancer Mother        lung   Hyperlipidemia Mother    Lung disease Mother    Eczema Brother    Colon cancer Maternal Aunt    Other Maternal Grandmother    Cancer Maternal Grandmother    Breast cancer Neg Hx    Ovarian cancer Neg Hx     Social History   Socioeconomic History   Marital status: Single    Spouse name: Not on file   Number of children: Not on file   Years of education: 18   Highest education level: Master's degree (e.g., MA, MS, MEng, MEd, MSW, MBA)  Occupational History   Occupation: Visual merchandiser  Tobacco Use   Smoking status: Never   Smokeless tobacco: Never  Vaping Use   Vaping Use: Never used  Substance and  Sexual Activity   Alcohol use: Not Currently    Comment: occasionally   Drug use: Never   Sexual activity: Yes    Partners: Male    Birth control/protection: None  Other Topics Concern   Not on file  Social History Narrative   Lives with mother    Drinks soda daily (8-10 ounces)   Social Determinants of Health   Financial Resource Strain: Not on file  Food Insecurity: Not on file  Transportation Needs: Not on file  Physical Activity: Not on file  Stress: Not on file  Social Connections: Not on file  Intimate Partner Violence: Not on file    Current Outpatient Medications on File Prior to Visit  Medication Sig Dispense Refill   Ascorbic Acid (VITAMIN C PO) Take by mouth.     Cholecalciferol (VITAMIN D3) LIQD 2,000 Units by Does not apply route.     D-Mannose 500 MG CAPS      LORazepam  (ATIVAN) 0.5 MG tablet Take 1 tablet (0.5 mg total) by mouth 2 (two) times daily as needed for anxiety. 15 tablet 1   natalizumab (TYSABRI) 300 MG/15ML injection Inject 15 mLs into the vein every 30 (thirty) days.      Current Facility-Administered Medications on File Prior to Visit  Medication Dose Route Frequency Provider Last Rate Last Admin   gadopentetate dimeglumine (MAGNEVIST) injection 18 mL  18 mL Intravenous Once PRN Sater, Nanine Means, MD        No Known Allergies   Review of Systems Constitutional: negative for chills, fatigue, fevers and sweats Eyes: negative for irritation, redness and visual disturbance Ears, nose, mouth, throat, and face: negative for hearing loss, nasal congestion, snoring and tinnitus Respiratory: negative for asthma, cough, sputum Cardiovascular: negative for chest pain, dyspnea, exertional chest pressure/discomfort, irregular heart beat, palpitations and syncope Gastrointestinal: negative for abdominal pain, change in bowel habits, nausea and vomiting Genitourinary: negative for abnormal menstrual periods, genital lesions, sexual problems and vaginal discharge, dysuria and urinary incontinence Integument/breast: negative for breast lump, breast tenderness and nipple discharge Hematologic/lymphatic: negative for bleeding and easy bruising Musculoskeletal:negative for back pain and muscle weakness Neurological: negative for dizziness, headaches, vertigo and weakness Endocrine: negative for diabetic symptoms including polydipsia, polyuria and skin dryness Allergic/Immunologic: negative for hay fever and urticaria      Objective:  Blood pressure 113/79, pulse (!) 103, height 5\' 2"  (1.575 m), weight 220 lb (99.8 kg), last menstrual period 12/12/2021, SpO2 99 %. Body mass index is 40.24 kg/m.  General Appearance:    Alert, cooperative, no distress, appears stated age, morbid obesity  Head:    Normocephalic, without obvious abnormality, atraumatic  Eyes:     PERRL, conjunctiva/corneas clear, EOM's intact, both eyes  Ears:    Normal external ear canals, both ears  Nose:   Nares normal, septum midline, mucosa normal, no drainage or sinus tenderness  Throat:   Lips, mucosa, and tongue normal; teeth and gums normal  Neck:   Supple, symmetrical, trachea midline, no adenopathy; thyroid: no enlargement/tenderness/nodules; no carotid bruit or JVD  Back:     Symmetric, no curvature, ROM normal, no CVA tenderness  Lungs:     Clear to auscultation bilaterally, respirations unlabored  Chest Wall:    No tenderness or deformity   Heart:    Regular rate and rhythm, S1 and S2 normal, no murmur, rub or gallop  Breast Exam:    No tenderness, masses, or nipple abnormality  Abdomen:     Soft, non-tender, bowel  sounds active all four quadrants, no masses, no organomegaly.    Genitalia:    Pelvic:external genitalia normal, vagina without lesions, discharge, or tenderness, rectovaginal septum  normal. Cervix normal in appearance, no cervical motion tenderness, no adnexal masses or tenderness.  Uterus normal size, shape, mobile, regular contours, nontender.  Rectal:    Normal external sphincter.  No hemorrhoids appreciated. Internal exam not done.   Extremities:   Extremities normal, atraumatic, no cyanosis or edema  Pulses:   2+ and symmetric all extremities  Skin:   Skin color, texture, turgor normal, no rashes or lesions  Lymph nodes:   Cervical, supraclavicular, and axillary nodes normal  Neurologic:   CNII-XII intact, normal strength, sensation and reflexes throughout   .  Labs:  Lab Results  Component Value Date   WBC 10.4 08/15/2021   HGB 12.5 08/15/2021   HCT 37.1 08/15/2021   MCV 89 08/15/2021   PLT 174 08/15/2021    Lab Results  Component Value Date   CREATININE 0.69 08/09/2020   BUN 11 08/09/2020   NA 138 08/09/2020   K 4.3 08/09/2020   CL 102 08/09/2020   CO2 23 08/09/2020    Lab Results  Component Value Date   ALT 20 08/09/2020   AST 18  08/09/2020   ALKPHOS 55 08/09/2020   BILITOT 0.2 08/09/2020    Lab Results  Component Value Date   TSH 1.630 07/14/2019     Assessment:   1. Well woman exam with routine gynecological exam   2. Screen for STD (sexually transmitted disease)   3. Class 3 severe obesity without serious comorbidity with body mass index (BMI) of 40.0 to 44.9 in adult, unspecified obesity type ([place])   4. Encounter for counseling regarding contraception   5. Anxiety and depression      Plan:  - Blood tests: CBC with diff, Lipoproteins, and TSH. - Breast self exam technique reviewed and patient encouraged to perform self-exam monthly. - Contraception:  discussed all methods, patient ok to begin OCPs. Desires low hormonal dose due to issues with hormones in the past. Prescribed Lo Loestrin. Given 1 month sample. - Discussed healthy lifestyle modifications. - Mammogram: to begin screens at age 37 - Pap smear  up to date . - COVID vaccination status: Has completed Pfizer series and received first booster. Is eligible for next booster.  - Flu vaccine: up to date.  - STD screening performed at patient's request.  - H/o anxiety and depression, screening today negative. Uses Ativan prn.  - Follow up in 1 year for annual exam   Rubie Maid, MD [facility]

## 2021-12-21 LAB — LIPID PANEL
Chol/HDL Ratio: 4.2 ratio (ref 0.0–4.4)
Cholesterol, Total: 199 mg/dL (ref 100–199)
HDL: 47 mg/dL (ref >39)
LDL Chol Calc (NIH): 141 mg/dL — ABNORMAL HIGH (ref 0–99)
Triglycerides: 59 mg/dL (ref 0–149)
VLDL Cholesterol Cal: 11 mg/dL (ref 5–40)

## 2021-12-21 LAB — COMPREHENSIVE METABOLIC PANEL
ALT: 12 IU/L (ref 0–32)
AST: 16 IU/L (ref 0–40)
Albumin/Globulin Ratio: 2 (ref 1.2–2.2)
Albumin: 4.4 g/dL (ref 3.9–5.0)
Alkaline Phosphatase: 49 IU/L (ref 44–121)
BUN/Creatinine Ratio: 13 (ref 9–23)
BUN: 10 mg/dL (ref 6–20)
Bilirubin Total: 0.5 mg/dL (ref 0.0–1.2)
CO2: 25 mmol/L (ref 20–29)
Calcium: 9.2 mg/dL (ref 8.7–10.2)
Chloride: 103 mmol/L (ref 96–106)
Creatinine, Ser: 0.76 mg/dL (ref 0.57–1.00)
Globulin, Total: 2.2 g/dL (ref 1.5–4.5)
Glucose: 79 mg/dL (ref 70–99)
Potassium: 3.8 mmol/L (ref 3.5–5.2)
Sodium: 142 mmol/L (ref 134–144)
Total Protein: 6.6 g/dL (ref 6.0–8.5)
eGFR: 108 mL/min/{1.73_m2} (ref >59)

## 2021-12-21 LAB — CERVICOVAGINAL ANCILLARY ONLY
Chlamydia: NEGATIVE
Comment: NEGATIVE
Comment: NEGATIVE
Comment: NORMAL
Neisseria Gonorrhea: NEGATIVE
Trichomonas: NEGATIVE

## 2021-12-21 LAB — RPR: RPR Ser Ql: NONREACTIVE

## 2021-12-21 LAB — TSH: TSH: 1.82 u[IU]/mL (ref 0.450–4.500)

## 2021-12-21 LAB — HIV ANTIBODY (ROUTINE TESTING W REFLEX): HIV Screen 4th Generation wRfx: NONREACTIVE

## 2021-12-21 NOTE — Telephone Encounter (Signed)
Hey spoke to pharmacy. Insurance will not cover unless she tries alternate therapy.

## 2021-12-22 ENCOUNTER — Encounter: Payer: Self-pay | Admitting: Obstetrics and Gynecology

## 2021-12-22 NOTE — Telephone Encounter (Signed)
Prior authorization has been started. Waiting on response. 

## 2021-12-22 NOTE — Telephone Encounter (Signed)
Patient noted during her visit that she has tried other birth controls in the past, I will see if she can let me know what she's tried, and maybe we can get around this.

## 2021-12-28 ENCOUNTER — Encounter: Payer: Self-pay | Admitting: Neurology

## 2021-12-28 ENCOUNTER — Telehealth: Payer: 59 | Admitting: Family Medicine

## 2021-12-28 DIAGNOSIS — M549 Dorsalgia, unspecified: Secondary | ICD-10-CM

## 2021-12-28 DIAGNOSIS — R300 Dysuria: Secondary | ICD-10-CM

## 2021-12-28 DIAGNOSIS — R200 Anesthesia of skin: Secondary | ICD-10-CM

## 2021-12-28 DIAGNOSIS — R350 Frequency of micturition: Secondary | ICD-10-CM | POA: Diagnosis not present

## 2021-12-28 NOTE — Progress Notes (Signed)
Tracey Lindsey   Needs in person follow up based on several concerns.  Message sent

## 2022-01-09 NOTE — Progress Notes (Signed)
Order(s) created erroneously. Erroneous order ID: [id number] ° Order moved by: SHEALY, DEBRA D ° Order move date/time: 01/09/2022 9:56 AM ° Source Patient: [id number] ° Source Contact: 10/11/2021 ° Destination Patient: [id number] ° Destination Contact: 07/21/2020 °

## 2022-01-16 DIAGNOSIS — G35 Multiple sclerosis: Secondary | ICD-10-CM | POA: Diagnosis not present

## 2022-02-13 ENCOUNTER — Other Ambulatory Visit: Payer: Self-pay | Admitting: *Deleted

## 2022-02-13 DIAGNOSIS — Z79899 Other long term (current) drug therapy: Secondary | ICD-10-CM

## 2022-02-13 DIAGNOSIS — G35 Multiple sclerosis: Secondary | ICD-10-CM

## 2022-02-13 DIAGNOSIS — E559 Vitamin D deficiency, unspecified: Secondary | ICD-10-CM

## 2022-02-15 DIAGNOSIS — H5213 Myopia, bilateral: Secondary | ICD-10-CM | POA: Diagnosis not present

## 2022-02-22 ENCOUNTER — Telehealth: Payer: Self-pay | Admitting: *Deleted

## 2022-02-22 ENCOUNTER — Ambulatory Visit: Payer: 59 | Admitting: Neurology

## 2022-02-22 ENCOUNTER — Encounter: Payer: Self-pay | Admitting: Neurology

## 2022-02-22 VITALS — BP 127/81 | HR 81 | Ht 62.0 in | Wt 213.0 lb

## 2022-02-22 DIAGNOSIS — N390 Urinary tract infection, site not specified: Secondary | ICD-10-CM

## 2022-02-22 DIAGNOSIS — E559 Vitamin D deficiency, unspecified: Secondary | ICD-10-CM

## 2022-02-22 DIAGNOSIS — E538 Deficiency of other specified B group vitamins: Secondary | ICD-10-CM | POA: Diagnosis not present

## 2022-02-22 DIAGNOSIS — R39198 Other difficulties with micturition: Secondary | ICD-10-CM | POA: Diagnosis not present

## 2022-02-22 DIAGNOSIS — Z79899 Other long term (current) drug therapy: Secondary | ICD-10-CM

## 2022-02-22 DIAGNOSIS — G35 Multiple sclerosis: Secondary | ICD-10-CM

## 2022-02-22 MED ORDER — NITROFURANTOIN MONOHYD MACRO 100 MG PO CAPS: 100.0000 mg | ORAL_CAPSULE | Freq: Two times a day (BID) | ORAL | 0 refills | Status: DC

## 2022-02-22 MED ORDER — TAMSULOSIN HCL 0.4 MG PO CAPS
0.4000 mg | ORAL_CAPSULE | Freq: Every day | ORAL | 11 refills | Status: DC
  Filled 2022-04-09: qty 30, 30d supply, fill #0

## 2022-02-22 MED ORDER — LORAZEPAM 0.5 MG PO TABS: 0.5000 mg | ORAL_TABLET | Freq: Two times a day (BID) | ORAL | 1 refills | Status: DC | PRN

## 2022-02-22 NOTE — Telephone Encounter (Signed)
Placed [facility] lab in [facility] lock box for routine lab pick up. Results pending. 

## 2022-02-22 NOTE — Progress Notes (Signed)
? ?[facility] ? ?PATIENT: Tracey Lindsey ?DOB: [birth year] ? ?REFERRING DOCTOR OR PCP:  Luan Pulling Jaci Lazier) ?SOURCE: Patient and ER records and PACS images ? ?_________________________________ ? ? ?HISTORICAL ? ?CHIEF COMPLAINT:  ?Chief Complaint  ?Patient presents with  ? Follow-up  ?  Pt alone, rm 10. Pt states that she has been having increasingly having UTI's.  Pt is on Tysabri. Last infusion was 02/13/2022 and scheduled for next infusion 03/13/22.   ? ? ?HISTORY OF PRESENT ILLNESS:  ?Tracey Lindsey is a 31 y.o. woman recently diagnosed with MS.    ? ?Update 02/22/2022 ?She is on Tysabri,   She feels stable and has no major side effects.  She notes a little hair thinning  No recent exacerbation and no new symptoms.  She is JCV Ab negative ? ?Gait is doing well and she can walk a couple miles without a rest..   Balance is doing ok but she uses the bannister on stairs..    She denies weakness.  She notes some numbness in her left foot but usually if she sits too Shell on a hard surface (toilet)  ? ?She has urinary frequency and  3x nocturia .  She has no incontinence.  She sometimes feels she does not competely UTI.  She has had 3UTI in last 6 months.   . ? ?Vision is the same --- right eye blurry but left and binocular is 20/20.   ? ?She has fatigue but accomplishes most of what she needs to.   She works 2 jobs..       She is having a little more trouble with sleep, esp falling back asleep after using bathroom.   She denies depression but has mild anxiety.  She rarely takes a lorazepam, usually if she has a lot of thoughts at night and gets insomnia.  Cognition is doing well.    ? ? ?MS History:    ?In retrospect, she had an episode of vertigo in 2014 with mild vision changes that improved.    In March 2016, she had vertigo that improved after a couple weeks.  In late April, she had  numbness in the left foot. When she woke up the next morning, she noted that the numbness was higher up  the left arm and the next day it also involved the left hand and she went to the emergency room. In the emergency room, she had an MRI of the brain that showed white matter foci were some for multiple sclerosis.    ? ?IMAGING ?MRI of the brain with and without contrast 03/29/15 showed several posterior fossa lesions with one in the right middle cerebellar peduncle and several in the pons.   The middle cerebellar peduncle focus and 2 of the pontine foci enhanced. There are also several periventricular and deep white matter foci in the cerebral hemispheres.   MRI cervical spine shows one enhancing focus.  Repeat imaging of the brain and cervical spine November 2017 did not show any new lesions. The previously enhancing lesions were smaller and did not enhance..    ? ?MRI of the brain 02/17/2020 showed Scattered T2/FLAIR hyperintense foci in the hemispheres.  Though nonspecific, the foci are consistent with the diagnosis of multiple sclerosis.  None of the foci appear to be acute and there are no new lesions compared to the 2019 MRI.   There is a partially empty sella turcica, unchanged in appearance since 2019.  This could be incidental or be  associated with elevated intracranial pressure. ? ?MRi of the cervical spiine 02/17/2020 showed T2 hyperintense focus within the spinal cord to the left adjacent to C3-C4.  This was also present on the 2017 MRI and is consistent with a chronic demyelinating plaque associated with multiple sclerosis.  There are no degenerative changes noted ? ?MRI of the brain and cervical spine 08/29/2021 unchanged compared to 02/17/2020 ? ?REVIEW OF SYSTEMS: ?Constitutional: No fevers, chills, sweats, or change in appetite.   She notes some fatigue. ?Eyes: No visual changes, double vision, eye pain ?Ear, nose and throat: No hearing loss, ear pain, nasal congestion, sore throat ?Cardiovascular: No chest pain, palpitations ?Respiratory:  No shortness of breath at rest or with exertion.   No  wheezes ?GastrointestinaI: No nausea, vomiting, diarrhea, abdominal pain, fecal incontinence ?Genitourinary:  Notes frequency, nocturia (x2).   She has PCOS ?Musculoskeletal:  No neck pain, back pain ?Integumentary: No rash, pruritus, skin lesions ?Neurological: as above ?Psychiatric: as above  ?Endocrine: No palpitations, diaphoresis, change in appetite, change in weigh or increased thirst ?Hematologic/Lymphatic:  No anemia, purpura, petechiae. ?Allergic/Immunologic: No itchy/runny eyes, nasal congestion, recent allergic reactions, rashes ? ?ALLERGIES: ?No Known Allergies ? ?HOME MEDICATIONS: ? ?Current Outpatient Medications:  ?  Ascorbic Acid (VITAMIN C PO), Take by mouth., Disp: , Rfl:  ?  Cholecalciferol (VITAMIN D3) LIQD, 2,000 Units by Does not apply route., Disp: , Rfl:  ?  D-Mannose 500 MG CAPS, , Disp: , Rfl:  ?  LO LOESTRIN FE 1 MG-10 MCG / 10 MCG tablet, TAKE 1 TABLET BY MOUTH EVERY DAY, Disp: 84 tablet, Rfl: 3 ?  natalizumab (TYSABRI) 300 MG/15ML injection, Inject 15 mLs into the vein every 30 (thirty) days. , Disp: , Rfl:  ?  nitrofurantoin, macrocrystal-monohydrate, (MACROBID) 100 MG capsule, Take 1 capsule (100 mg total) by mouth 2 (two) times daily., Disp: 14 capsule, Rfl: 0 ?  tamsulosin (FLOMAX) 0.4 MG CAPS capsule, Take 1 capsule (0.4 mg total) by mouth daily., Disp: 30 capsule, Rfl: 11 ?  LORazepam (ATIVAN) 0.5 MG tablet, Take 1 tablet (0.5 mg total) by mouth 2 (two) times daily as needed for anxiety., Disp: 15 tablet, Rfl: 1 ?No current facility-administered medications for this visit. ? ?Facility-Administered Medications Ordered in Other Visits:  ?  gadopentetate dimeglumine (MAGNEVIST) injection 18 mL, 18 mL, Intravenous, Once PRN, Angell Honse, Pearletha Furl, MD ? ?PAST MEDICAL HISTORY: ?Past Medical History:  ?Diagnosis Date  ? Anxiety   ? Chlamydia   ? Depression   ? History of abnormal cervical Papanicolaou smear   ? MS (multiple sclerosis) [facility]) April 2016  ? PCOS (polycystic ovarian syndrome)   ?  Vitamin D deficiency   ? ? ?PAST SURGICAL HISTORY: ?Past Surgical History:  ?Procedure Laterality Date  ? NO PAST SURGERIES    ? ? ?FAMILY HISTORY: ?Family History  ?Problem Relation Age of Onset  ? Diabetes Father   ? Hypertension Father   ? Stroke Father   ? Cancer Mother   ?     lung  ? Hyperlipidemia Mother   ? Lung disease Mother   ? Eczema Brother   ? Colon cancer Maternal Aunt   ? Other Maternal Grandmother   ? Cancer Maternal Grandmother   ? Breast cancer Neg Hx   ? Ovarian cancer Neg Hx   ? ? ?SOCIAL HISTORY: ? ?Social History  ? ?Socioeconomic History  ? Marital status: Single  ?  Spouse name: Not on file  ? Number of children: Not on file  ?  Years of education: 43  ? Highest education level: Master's degree (e.g., MA, MS, MEng, MEd, MSW, MBA)  ?Occupational History  ? Occupation: Visual merchandiser  ?Tobacco Use  ? Smoking status: Never  ? Smokeless tobacco: Never  ?Vaping Use  ? Vaping Use: Never used  ?Substance and Sexual Activity  ? Alcohol use: Not Currently  ?  Comment: occasionally  ? Drug use: Never  ? Sexual activity: Yes  ?  Partners: Male  ?  Birth control/protection: None  ?Other Topics Concern  ? Not on file  ?Social History Narrative  ? Lives with mother   ? Drinks soda daily (8-10 ounces)  ? ?Social Determinants of Health  ? ?Financial Resource Strain: Not on file  ?Food Insecurity: Not on file  ?Transportation Needs: Not on file  ?Physical Activity: Not on file  ?Stress: Not on file  ?Social Connections: Not on file  ?Intimate Partner Violence: Not on file  ? ? ? ?PHYSICAL EXAM ? ?Vitals:  ? 02/22/22 1257  ?Weight: 213 lb (96.6 kg)  ?Height: 5\' 2"  (1.575 m)  ? ? ?Body mass index is 38.96 kg/m?. ? ? ?General: The patient is well-developed and well-nourished and in no acute distress ?  ?Neurologic Exam ? ?Mental status: The patient is alert and oriented x 3 at the time of the examination. The patient has apparent normal recent and remote memory, with an apparently normal attention span  and concentration ability.   Speech is normal. ? ?Cranial nerves:   Extraocular muscles are intact.  Pupils react normally.,  Color vision is normal.  Facial strength and sensation normal. Trapezius strength is st

## 2022-02-23 LAB — CBC WITH DIFFERENTIAL/PLATELET
Basophils Absolute: 0.1 10*3/uL (ref 0.0–0.2)
Basos: 1 %
EOS (ABSOLUTE): 0.2 10*3/uL (ref 0.0–0.4)
Eos: 2 %
Hematocrit: 38.7 % (ref 34.0–46.6)
Hemoglobin: 13.1 g/dL (ref 11.1–15.9)
Immature Grans (Abs): 0 10*3/uL (ref 0.0–0.1)
Immature Granulocytes: 0 %
Lymphocytes Absolute: 4.1 10*3/uL — ABNORMAL HIGH (ref 0.7–3.1)
Lymphs: 47 %
MCH: 31.1 pg (ref 26.6–33.0)
MCHC: 33.9 g/dL (ref 31.5–35.7)
MCV: 92 fL (ref 79–97)
Monocytes Absolute: 0.6 10*3/uL (ref 0.1–0.9)
Monocytes: 6 %
Neutrophils Absolute: 3.9 10*3/uL (ref 1.4–7.0)
Neutrophils: 44 %
Platelets: 214 10*3/uL (ref 150–450)
RBC: 4.21 x10E6/uL (ref 3.77–5.28)
RDW: 12.7 % (ref 11.7–15.4)
WBC: 8.7 10*3/uL (ref 3.4–10.8)

## 2022-02-23 LAB — URINALYSIS, ROUTINE W REFLEX MICROSCOPIC
Bilirubin, UA: NEGATIVE
Glucose, UA: NEGATIVE
Leukocytes,UA: NEGATIVE
Nitrite, UA: NEGATIVE
Protein,UA: NEGATIVE
RBC, UA: NEGATIVE
Urobilinogen, Ur: 0.2 mg/dL (ref 0.2–1.0)
pH, UA: 5 (ref 5.0–7.5)

## 2022-02-23 LAB — VITAMIN D 25 HYDROXY (VIT D DEFICIENCY, FRACTURES): Vit D, 25-Hydroxy: 58.2 ng/mL (ref 30.0–100.0)

## 2022-02-23 LAB — VITAMIN B12: Vitamin B-12: 1350 pg/mL — ABNORMAL HIGH (ref 232–1245)

## 2022-02-25 LAB — URINE CULTURE

## 2022-02-26 ENCOUNTER — Encounter: Payer: Self-pay | Admitting: Neurology

## 2022-02-26 ENCOUNTER — Other Ambulatory Visit: Payer: Self-pay | Admitting: Neurology

## 2022-02-26 ENCOUNTER — Telehealth: Payer: Self-pay | Admitting: *Deleted

## 2022-02-26 MED ORDER — VORICONAZOLE 200 MG PO TABS: 200.0000 mg | ORAL_TABLET | Freq: Two times a day (BID) | ORAL | 0 refills | Status: DC

## 2022-02-26 NOTE — Telephone Encounter (Signed)
-----   Message from Asa Lente, MD sent at 02/25/2022  8:49 PM EDT ----- ?She did not have a bacterial UTI but fungus grew.  These will often clear by themselves.  However, if she is having symptoms of UTI such as urgency, she should do 2 weeks of fluconazole 200 mg daily. ? ?Other labs were fine. ?

## 2022-02-26 NOTE — Telephone Encounter (Signed)
Took call from phone staff and spoke w/ pt. She states she started nitrofurantoin 02/22/22. Only given 10/14 caps by pharmacy. Unsure why. She googled fungus that grew on her culture and read above med does not treat this particular fungus. Would like to know from Dr. Epimenio Foot if he would like to try different med on her. She is still experiencing back pain (Was also having urgency/frequency).  ? ?She was also concerned about elevated B12 since she does not take a multivitamin or B12 supplement for this. Advised having high B12 is not a concern. Body gets rid of any extra not needed. Would still like to know Dr. Bonnita Hollow thoughts on this. ? ?Pt ok with [place] calling back or sending mychart for clarification. ?

## 2022-02-26 NOTE — Telephone Encounter (Signed)
Called pt to go over results that MD sent her via mychart 02/25/22. LVM. ?

## 2022-03-05 NOTE — Telephone Encounter (Signed)
JCV ab drawn on 02/22/22 negative, index: 0.08.  ?

## 2022-03-07 ENCOUNTER — Other Ambulatory Visit (HOSPITAL_COMMUNITY)
Admission: RE | Admit: 2022-03-07 | Discharge: 2022-03-07 | Disposition: A | Discharge status: Home / Self Care | Payer: 59 | Source: Ambulatory Visit | Attending: Family Medicine | Admitting: Family Medicine

## 2022-03-07 ENCOUNTER — Ambulatory Visit: Payer: 59 | Admitting: Family Medicine

## 2022-03-07 ENCOUNTER — Encounter: Payer: Self-pay | Admitting: Family Medicine

## 2022-03-07 VITALS — BP 116/70 | HR 100 | Temp 98.2°F | Resp 16 | Ht 62.0 in | Wt 217.4 lb

## 2022-03-07 DIAGNOSIS — Z79899 Other long term (current) drug therapy: Secondary | ICD-10-CM | POA: Diagnosis not present

## 2022-03-07 DIAGNOSIS — R350 Frequency of micturition: Secondary | ICD-10-CM | POA: Diagnosis not present

## 2022-03-07 DIAGNOSIS — R351 Nocturia: Secondary | ICD-10-CM | POA: Diagnosis not present

## 2022-03-07 DIAGNOSIS — N761 Subacute and chronic vaginitis: Secondary | ICD-10-CM | POA: Insufficient documentation

## 2022-03-07 DIAGNOSIS — Z113 Encounter for screening for infections with a predominantly sexual mode of transmission: Secondary | ICD-10-CM

## 2022-03-07 DIAGNOSIS — D84821 Immunodeficiency due to drugs: Secondary | ICD-10-CM | POA: Diagnosis not present

## 2022-03-07 DIAGNOSIS — G35 Multiple sclerosis: Secondary | ICD-10-CM

## 2022-03-07 DIAGNOSIS — N390 Urinary tract infection, site not specified: Secondary | ICD-10-CM

## 2022-03-07 LAB — POCT URINALYSIS DIPSTICK
Bilirubin, UA: NEGATIVE
Glucose, UA: NEGATIVE
Ketones, UA: NEGATIVE
Nitrite, UA: NEGATIVE
Protein, UA: NEGATIVE
Spec Grav, UA: 1.01 (ref 1.010–1.025)
Urobilinogen, UA: 0.2 E.U./dL
pH, UA: 5 (ref 5.0–8.0)

## 2022-03-07 NOTE — Progress Notes (Signed)
? ? ?Patient ID: Tracey Lindsey, female    DOB: 11-23-1991, 31 y.o.   MRN: [id number] ? ?PCP: Danelle Berry, PA-C ? ?Chief Complaint  ?Patient presents with  ? Urinary Frequency  ?  Also having pressure near pelvis, pt has already been given antibiotics for this since her Neurologist prescribed it for her. Sx continue being a concern for her since she believes te medication has not helped.  ? ? ?Subjective:  ? ?Tracey Lindsey is a 31 y.o. female, presents to clinic with CC of the following: ? ?HPI  ? ?UTI - was just tested and treated 10 d ago by neurology , started on bactrim, switched to antifungal, pt still having urinary frequency and pressure, no hematuria, odor ? ?Pressure to suprapubic area and back pain  ?Frequency - just a little more than her normal urinary frequency with her MS ? ?Discharge and itching but not daily - clear  ?Check for STDs  ?One partner ?No pain with sex  ? ?Declined HIV RPR  ? ?Hx of MS and urinary sx and recurrent UTI ?On tamsulosin for frequency ?No prior urology eval ? ?Chart thoroughly reviewed - including recent neuro visit, results, nurse notes and discussion with pt regarding tx and restuls ?Reviewed past urine micro results over the past several years ? ? ? ? ? ?Patient Active Problem List  ? Diagnosis Date Noted  ? Dysesthesia affecting both sides of body 02/07/2021  ? 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  ? Nocturia 08/05/2019  ? Insulin resistance 02/18/2019  ? Screen for STD (sexually transmitted disease) 03/01/2017  ? High risk medication use 01/24/2017  ? Other headache syndrome 07/24/2016  ? Obesity 11/19/2015  ? Verruca vulgaris 06/08/2015  ? Multiple sclerosis (HCC) 04/04/2015  ? Numbness 04/04/2015  ? Vitamin D deficiency 04/04/2015  ? MS (multiple sclerosis) (HCC) 03/04/2015  ? ? ? ? ?Current Outpatient Medications:  ?  Ascorbic Acid (VITAMIN C PO), Take by mouth., Disp: , Rfl:  ?  Cholecalciferol (VITAMIN D3) LIQD, 2,000 Units by Does not apply route., Disp: , Rfl:  ?  D-Mannose  500 MG CAPS, , Disp: , Rfl:  ?  LO LOESTRIN FE 1 MG-10 MCG / 10 MCG tablet, TAKE 1 TABLET BY MOUTH EVERY DAY, Disp: 84 tablet, Rfl: 3 ?  LORazepam (ATIVAN) 0.5 MG tablet, Take 1 tablet (0.5 mg total) by mouth 2 (two) times daily as needed for anxiety., Disp: 15 tablet, Rfl: 1 ?  natalizumab (TYSABRI) 300 MG/15ML injection, Inject 15 mLs into the vein every 30 (thirty) days. , Disp: , Rfl:  ?  nitrofurantoin, macrocrystal-monohydrate, (MACROBID) 100 MG capsule, Take 1 capsule (100 mg total) by mouth 2 (two) times daily., Disp: 14 capsule, Rfl: 0 ?  tamsulosin (FLOMAX) 0.4 MG CAPS capsule, Take 1 capsule (0.4 mg total) by mouth daily., Disp: 30 capsule, Rfl: 11 ?  voriconazole (VFEND) 200 MG tablet, Take 1 tablet (200 mg total) by mouth 2 (two) times daily., Disp: 20 tablet, Rfl: 0 ?No current facility-administered medications for this visit. ? ?Facility-Administered Medications Ordered in Other Visits:  ?  gadopentetate dimeglumine (MAGNEVIST) injection 18 mL, 18 mL, Intravenous, Once PRN, Sater, Pearletha Furl, MD ? ? ?No Known Allergies ? ? ?Social History  ? ?Tobacco Use  ? Smoking status: Never  ? Smokeless tobacco: Never  ?Vaping Use  ? Vaping Use: Never used  ?Substance Use Topics  ? Alcohol use: Not Currently  ?  Comment: occasionally  ? Drug use: Never  ?  ? ? ?  Chart Review Today: ?I personally reviewed active problem list, medication list, allergies, family history, social history, health maintenance, notes from last encounter, lab results, imaging with the patient/caregiver today. ? ? ?Review of Systems  ?Constitutional: Negative.   ?HENT: Negative.    ?Eyes: Negative.   ?Respiratory: Negative.    ?Cardiovascular: Negative.   ?Gastrointestinal: Negative.   ?Endocrine: Negative.   ?Genitourinary: Negative.   ?Musculoskeletal: Negative.   ?Skin: Negative.   ?Allergic/Immunologic: Negative.   ?Neurological: Negative.   ?Hematological: Negative.   ?Psychiatric/Behavioral: Negative.    ?All other systems reviewed and  are negative. ? ?   ?Objective:  ? ?Vitals:  ? 03/07/22 1548  ?BP: 116/70  ?Pulse: 100  ?Resp: 16  ?Temp: 98.2 ?F (36.8 ?C)  ?TempSrc: Oral  ?SpO2: 98%  ?Weight: 217 lb 6.4 oz (98.6 kg)  ?Height: 5\' 2"  (1.575 m)  ?  ?Body mass index is 39.76 kg/m?. ? ?Physical Exam ?Vitals reviewed.  ?Constitutional:   ?   General: She is not in acute distress. ?   Appearance: Normal appearance. She is obese. She is not ill-appearing, toxic-appearing or diaphoretic.  ?HENT:  ?   Head: Normocephalic and atraumatic.  ?   Right Ear: External ear normal.  ?   Left Ear: External ear normal.  ?Eyes:  ?   Conjunctiva/sclera: Conjunctivae normal.  ?Cardiovascular:  ?   Rate and Rhythm: Normal rate and regular rhythm.  ?   Pulses: Normal pulses.  ?   Heart sounds: Normal heart sounds.  ?Abdominal:  ?   General: Bowel sounds are normal. There is no distension.  ?   Palpations: Abdomen is soft.  ?   Tenderness: There is no abdominal tenderness. There is no right CVA tenderness, left CVA tenderness, guarding or rebound.  ?Skin: ?   Coloration: Skin is not jaundiced or pale.  ?   Findings: No lesion or rash.  ?Neurological:  ?   Mental Status: She is alert. Mental status is at baseline.  ?Psychiatric:     ?   Mood and Affect: Mood normal.     ?   Behavior: Behavior normal.  ?  ? ?Results for orders placed or performed in visit on 02/22/22  ?Culture, Urine  ? Specimen: Blood  ? BL  ?Result Value Ref Range  ? Urine Culture, Routine Final report (A)   ? Organism ID, Bacteria Candida krusei (A)   ? ORGANISM ID, BACTERIA Not applicable   ?VITAMIN D 25 Hydroxy (Vit-D Deficiency, Fractures)  ?Result Value Ref Range  ? Vit D, 25-Hydroxy 58.2 30.0 - 100.0 ng/mL  ?CBC with Differential/Platelet  ?Result Value Ref Range  ? WBC 8.7 3.4 - 10.8 x10E3/uL  ? RBC 4.21 3.77 - 5.28 x10E6/uL  ? Hemoglobin 13.1 11.1 - 15.9 g/dL  ? Hematocrit 38.7 34.0 - 46.6 %  ? MCV 92 79 - 97 fL  ? MCH 31.1 26.6 - 33.0 pg  ? MCHC 33.9 31.5 - 35.7 g/dL  ? RDW 12.7 11.7 - 15.4 %  ?  Platelets 214 150 - 450 x10E3/uL  ? Neutrophils 44 Not Estab. %  ? Lymphs 47 Not Estab. %  ? Monocytes 6 Not Estab. %  ? Eos 2 Not Estab. %  ? Basos 1 Not Estab. %  ? Neutrophils Absolute 3.9 1.4 - 7.0 x10E3/uL  ? Lymphocytes Absolute 4.1 (H) 0.7 - 3.1 x10E3/uL  ? Monocytes Absolute 0.6 0.1 - 0.9 x10E3/uL  ? EOS (ABSOLUTE) 0.2 0.0 - 0.4 x10E3/uL  ?  Basophils Absolute 0.1 0.0 - 0.2 x10E3/uL  ? Immature Granulocytes 0 Not Estab. %  ? Immature Grans (Abs) 0.0 0.0 - 0.1 x10E3/uL  ?Vitamin B12  ?Result Value Ref Range  ? Vitamin B-12 1,350 (H) 232 - 1,245 pg/mL  ?Urinalysis, Routine w reflex microscopic  ?Result Value Ref Range  ? Specific Gravity, UA      >=1.030 (A) 1.005 - 1.030  ? pH, UA 5.0 5.0 - 7.5  ? Color, UA Yellow Yellow  ? Appearance Ur Turbid (A) Clear  ? Leukocytes,UA Negative Negative  ? Protein,UA Negative Negative/Trace  ? Glucose, UA Negative Negative  ? Ketones, UA Trace (A) Negative  ? RBC, UA Negative Negative  ? Bilirubin, UA Negative Negative  ? Urobilinogen, Ur 0.2 0.2 - 1.0 mg/dL  ? Nitrite, UA Negative Negative  ? Microscopic Examination Comment   ? ? ?   ?Assessment & Plan:  ? ?1. Urinary frequency ?Suspected UTI over a week ago has not resolved, micro reviewed it was positive for yeast and not bacteria, pt compliant with antifungal prescribed by neuro but not all sx resolved ?Has hx of urinary frequency and UTI's ?Will retest and treat positive findings, pt overall feeling well, no N/V, fever - no signs of pyelo ?- Ambulatory referral to Urology ?- POCT urinalysis dipstick ?- Urine Culture ?- Cervicovaginal ancillary only ? ?2. Subacute vaginitis ?Retest - unforutnately not able to do microbiology testing on vaginal swab - pt may want to f/up with GYN for more detailed testing if recurrent yeast becomes a problem ?For now she will finish current med ?Having intermittent clear mucous like vaginal discharge and itching ?- Cervicovaginal ancillary only ? ?3. Recurrent UTI ? ?- Ambulatory referral  to Urology ?- POCT urinalysis dipstick ?- Urine Culture ?- Cervicovaginal ancillary only ? ?4. Screening examination for STD (sexually transmitted disease) ?One female partner with unprotected sex, s

## 2022-03-08 ENCOUNTER — Encounter: Payer: Self-pay | Admitting: Family Medicine

## 2022-03-08 LAB — URINE CULTURE
MICRO NUMBER:: 13226623
SPECIMEN QUALITY:: ADEQUATE

## 2022-03-09 LAB — CERVICOVAGINAL ANCILLARY ONLY
Bacterial Vaginitis (gardnerella): NEGATIVE
Candida Glabrata: NEGATIVE
Candida Vaginitis: NEGATIVE
Chlamydia: NEGATIVE
Comment: NEGATIVE
Comment: NEGATIVE
Comment: NEGATIVE
Comment: NEGATIVE
Comment: NEGATIVE
Comment: NORMAL
Neisseria Gonorrhea: NEGATIVE
Trichomonas: NEGATIVE

## 2022-03-13 DIAGNOSIS — G35 Multiple sclerosis: Secondary | ICD-10-CM | POA: Diagnosis not present

## 2022-04-09 ENCOUNTER — Other Ambulatory Visit (HOSPITAL_COMMUNITY): Payer: Self-pay

## 2022-04-10 DIAGNOSIS — G35 Multiple sclerosis: Secondary | ICD-10-CM | POA: Diagnosis not present

## 2022-04-16 ENCOUNTER — Encounter: Payer: Self-pay | Admitting: Urology

## 2022-04-16 ENCOUNTER — Ambulatory Visit: Payer: 59 | Admitting: Urology

## 2022-04-16 VITALS — BP 123/86 | HR 90 | Ht 62.0 in | Wt 217.0 lb

## 2022-04-16 DIAGNOSIS — N390 Urinary tract infection, site not specified: Secondary | ICD-10-CM

## 2022-04-16 DIAGNOSIS — R350 Frequency of micturition: Secondary | ICD-10-CM | POA: Diagnosis not present

## 2022-04-16 DIAGNOSIS — N3020 Other chronic cystitis without hematuria: Secondary | ICD-10-CM | POA: Diagnosis not present

## 2022-04-16 DIAGNOSIS — R351 Nocturia: Secondary | ICD-10-CM | POA: Diagnosis not present

## 2022-04-16 LAB — MICROSCOPIC EXAMINATION

## 2022-04-16 LAB — URINALYSIS, COMPLETE
Bilirubin, UA: NEGATIVE
Glucose, UA: NEGATIVE
Ketones, UA: NEGATIVE
Leukocytes,UA: NEGATIVE
Nitrite, UA: NEGATIVE
Protein,UA: NEGATIVE
Specific Gravity, UA: >1.03 — ABNORMAL HIGH (ref 1.005–1.030)
Urobilinogen, Ur: 0.2 mg/dL (ref 0.2–1.0)
pH, UA: 5.5 (ref 5.0–7.5)

## 2022-04-16 LAB — BLADDER SCAN AMB NON-IMAGING: Scan Result: 4

## 2022-04-16 MED ORDER — MIRABEGRON ER 50 MG PO TB24: 50.0000 mg | ORAL_TABLET | Freq: Every day | ORAL | 11 refills | Status: DC

## 2022-04-16 NOTE — Progress Notes (Signed)
? ?04/16/2022 ?8:37 AM  ? ?Tracey Lindsey ?1991/04/01 ?[id number] ? ?Referring provider: Delsa Grana, PA-C ?[address] ?[address] ?[place],  [place] [address] ? ?Chief Complaint  ?Patient presents with  ? Urinary Frequency  ? ? ?HPI: ?I was consulted to assess the patient is urinary frequency.  She voids every 3 hours during the day but gets up 4-5 times a night.  No ankle edema.  She has had multiple sclerosis for approximately 7 years.  She is c urrently a partial responder to Flomax.  She works as a Education officer, museum ? ?She leaks rarely when she sneezes but does not wear a pad.  In the last 6 months she has had 3-4 bladder infections.  She will get urgency frequency and back pain that respond favorably to antibiotics.  Her nocturia is been present for years ? ?She has been told she has enlarged uterus ? ?No history of kidney stones bladder surgery and bowel movements are normal ? ? ?PMH: ?Past Medical History:  ?Diagnosis Date  ? Anxiety   ? Chlamydia   ? Depression   ? History of abnormal cervical Papanicolaou smear   ? MS (multiple sclerosis) [facility]) April 2016  ? PCOS (polycystic ovarian syndrome)   ? Vitamin D deficiency   ? ? ?Surgical History: ?Past Surgical History:  ?Procedure Laterality Date  ? NO PAST SURGERIES    ? ? ?Home Medications:  ?Allergies as of 04/16/2022   ?No Known Allergies ?  ? ?  ?Medication List  ?  ? ?  ? Accurate as of Apr 16, 2022  8:37 AM. If you have any questions, ask your nurse or doctor.  ?  ?  ? ?  ? ?STOP taking these medications   ? ?voriconazole 200 MG tablet ?Commonly known as: VFEND ?Stopped by: Reece Packer, MD ?  ? ?  ? ?TAKE these medications   ? ?D-Mannose 500 MG Caps ?  ?Lo Loestrin Fe 1 MG-10 MCG / 10 MCG tablet ?Generic drug: Norethindrone-Ethinyl Estradiol-Fe Biphas ?TAKE 1 TABLET BY MOUTH EVERY DAY ?  ?LORazepam 0.5 MG tablet ?Commonly known as: ATIVAN ?Take 1 tablet (0.5 mg total) by mouth 2 (two) times daily as needed for anxiety. ?  ?natalizumab 300 MG/15ML  injection ?Commonly known as: TYSABRI ?Inject 15 mLs into the vein every 30 (thirty) days. ?  ?tamsulosin 0.4 MG Caps capsule ?Commonly known as: FLOMAX ?Take 1 capsule (0.4 mg total) by mouth daily. ?  ?VITAMIN C PO ?Take by mouth. ?  ?Vitamin D3 Liqd ?2,000 Units by Does not apply route. ?  ? ?  ? ? ?Allergies: No Known Allergies ? ?Family History: ?Family History  ?Problem Relation Age of Onset  ? Diabetes Father   ? Hypertension Father   ? Stroke Father   ? Cancer Mother   ?     lung  ? Hyperlipidemia Mother   ? Lung disease Mother   ? Eczema Brother   ? Colon cancer Maternal Aunt   ? Other Maternal Grandmother   ? Cancer Maternal Grandmother   ? Breast cancer Neg Hx   ? Ovarian cancer Neg Hx   ? ? ?Social History:  reports that she has never smoked. She has never used smokeless tobacco. She reports that she does not currently use alcohol. She reports that she does not use drugs. ? ?ROS: ?  ? ?  ? ?  ? ?  ? ?  ? ?  ? ?  ? ?  ? ?  ? ?  ? ?  ? ?  ? ?  ? ?  Physical Exam: ?There were no vitals taken for this visit.  ?Constitutional:  Alert and oriented, No acute distress. ?HEENT: [place] AT, moist mucus membranes.  Trachea midline, no masses. ?Cardiovascular: No clubbing, cyanosis, or edema. ?Respiratory: Normal respiratory effort, no increased work of breathing. ?GI: Abdomen is soft, nontender, nondistended, no abdominal masses ?GU: No prolapse or diverticulum ?Skin: No rashes, bruises or suspicious lesions. ?Lymph: No cervical or inguinal adenopathy. ?Neurologic: Grossly intact, no focal deficits, moving all 4 extremities. ?Psychiatric: Normal mood and affect. ? ?Laboratory Data: ?Lab Results  ?Component Value Date  ? WBC 8.7 02/22/2022  ? HGB 13.1 02/22/2022  ? HCT 38.7 02/22/2022  ? MCV 92 02/22/2022  ? PLT 214 02/22/2022  ? ? ?Lab Results  ?Component Value Date  ? CREATININE 0.76 12/20/2021  ? ? ?No results found for: PSA ? ?Lab Results  ?Component Value Date  ? TESTOSTERONE 37 07/14/2019  ? ? ?Lab Results   ?Component Value Date  ? HGBA1C 5.4 05/30/2020  ? ? ?Urinalysis ?   ?Component Value Date/Time  ? COLORURINE YELLOW 03/01/2017 1303  ? APPEARANCEUR Turbid (A) 02/22/2022 1345  ? LABSPEC 1.022 03/01/2017 1303  ? PHURINE 7.5 03/01/2017 1303  ? GLUCOSEU Negative 02/22/2022 1345  ? [place] NEGATIVE 03/01/2017 1303  ? BILIRUBINUR Negative 03/07/2022 1646  ? BILIRUBINUR Negative 02/22/2022 1345  ? [place] NEGATIVE 03/01/2017 1303  ? PROTEINUR Negative 03/07/2022 1646  ? PROTEINUR Negative 02/22/2022 1345  ? PROTEINUR NEGATIVE 03/01/2017 1303  ? UROBILINOGEN 0.2 03/07/2022 1646  ? UROBILINOGEN 0.2 03/31/2015 1023  ? NITRITE Negative 03/07/2022 1646  ? NITRITE Negative 02/22/2022 1345  ? NITRITE NEGATIVE 03/01/2017 1303  ? LEUKOCYTESUR Trace (A) 03/07/2022 1646  ? LEUKOCYTESUR Negative 02/22/2022 1345  ? ? ?Pertinent Imaging: ?Urine reviewed.  Urine sent for culture.  Chart reviewed ? ?Assessment & Plan: Patient has multiple sclerosis with nighttime frequency.  She has recurrent bladder infections.  1 out of the last 3 cultures in the last 6 months was positive and 2 were negative for bacteria ? ?Her urine looked positive of and she has had mild increase in frequency recently.  She had microscopic hematuria.  I will get a renal ultrasound for UTIs and hematuria.  She is a non-smoker ? ?The role of urinary prophylaxis for UTIs discussed.  It may or may not down regulate nighttime frequency.  The role of stopping Flomax and utilizing a beta 3 agonist was discussed ? ?Patient may go on prophylaxis in the future.  Reassess in 6 weeks on Myrbetriq 50 mg samples and prescription.  Stop the Flomax.  Call if culture positive.  Call if renal ultrasound abnormal ? ?1. Urinary frequency ? ?- Urinalysis, Complete ? ? ?No follow-ups on file. ? ?Reece Packer, MD ? ?[facility] ?[address], [address] ?[place], [place] [address] ?[phone] ?  ?

## 2022-04-19 LAB — CULTURE, URINE COMPREHENSIVE

## 2022-05-01 ENCOUNTER — Ambulatory Visit
Admission: RE | Admit: 2022-05-01 | Discharge: 2022-05-01 | Disposition: A | Discharge status: Home / Self Care | Payer: 59 | Source: Ambulatory Visit | Attending: Urology | Admitting: Urology

## 2022-05-01 DIAGNOSIS — N390 Urinary tract infection, site not specified: Secondary | ICD-10-CM | POA: Insufficient documentation

## 2022-05-01 DIAGNOSIS — R350 Frequency of micturition: Secondary | ICD-10-CM | POA: Insufficient documentation

## 2022-05-07 ENCOUNTER — Telehealth: Payer: Self-pay | Admitting: *Deleted

## 2022-05-07 DIAGNOSIS — N2889 Other specified disorders of kidney and ureter: Secondary | ICD-10-CM

## 2022-05-07 DIAGNOSIS — N390 Urinary tract infection, site not specified: Secondary | ICD-10-CM

## 2022-05-07 NOTE — Telephone Encounter (Signed)
Left VM to return my call to review renal US

## 2022-05-08 DIAGNOSIS — G35 Multiple sclerosis: Secondary | ICD-10-CM | POA: Diagnosis not present

## 2022-05-08 NOTE — Telephone Encounter (Signed)
Spoke with patient regarding Renal US and ordered MRI ABD per Dr. Sherron Monday.Voiced understanding.

## 2022-05-17 ENCOUNTER — Ambulatory Visit
Admission: RE | Admit: 2022-05-17 | Discharge: 2022-05-17 | Disposition: A | Discharge status: Home / Self Care | Payer: 59 | Source: Ambulatory Visit | Attending: Urology | Admitting: Urology

## 2022-05-17 DIAGNOSIS — N281 Cyst of kidney, acquired: Secondary | ICD-10-CM | POA: Diagnosis not present

## 2022-05-17 DIAGNOSIS — N2889 Other specified disorders of kidney and ureter: Secondary | ICD-10-CM

## 2022-05-17 DIAGNOSIS — N390 Urinary tract infection, site not specified: Secondary | ICD-10-CM

## 2022-05-17 DIAGNOSIS — Q8909 Congenital malformations of spleen: Secondary | ICD-10-CM | POA: Diagnosis not present

## 2022-05-17 MED ORDER — GADOBENATE DIMEGLUMINE 529 MG/ML IV SOLN
20.0000 mL | Freq: Once | INTRAVENOUS | Status: AC | PRN
  Administered 2022-05-17: 20 mL via INTRAVENOUS

## 2022-06-06 DIAGNOSIS — G35 Multiple sclerosis: Secondary | ICD-10-CM | POA: Diagnosis not present

## 2022-06-11 ENCOUNTER — Ambulatory Visit: Payer: 59 | Admitting: Urology

## 2022-06-21 DIAGNOSIS — F411 Generalized anxiety disorder: Secondary | ICD-10-CM | POA: Diagnosis not present

## 2022-07-04 DIAGNOSIS — G35 Multiple sclerosis: Secondary | ICD-10-CM | POA: Diagnosis not present

## 2022-07-05 DIAGNOSIS — F411 Generalized anxiety disorder: Secondary | ICD-10-CM | POA: Diagnosis not present

## 2022-07-26 DIAGNOSIS — F411 Generalized anxiety disorder: Secondary | ICD-10-CM | POA: Diagnosis not present

## 2022-07-30 ENCOUNTER — Ambulatory Visit: Payer: 59 | Admitting: Urology

## 2022-08-01 DIAGNOSIS — G35 Multiple sclerosis: Secondary | ICD-10-CM | POA: Diagnosis not present

## 2022-08-09 DIAGNOSIS — F411 Generalized anxiety disorder: Secondary | ICD-10-CM | POA: Diagnosis not present

## 2022-08-29 ENCOUNTER — Other Ambulatory Visit (INDEPENDENT_AMBULATORY_CARE_PROVIDER_SITE_OTHER): Payer: Self-pay

## 2022-08-29 ENCOUNTER — Telehealth: Payer: Self-pay | Admitting: *Deleted

## 2022-08-29 DIAGNOSIS — G35 Multiple sclerosis: Secondary | ICD-10-CM

## 2022-08-29 DIAGNOSIS — Z0289 Encounter for other administrative examinations: Secondary | ICD-10-CM

## 2022-08-29 DIAGNOSIS — E559 Vitamin D deficiency, unspecified: Secondary | ICD-10-CM

## 2022-08-29 DIAGNOSIS — Z79899 Other long term (current) drug therapy: Secondary | ICD-10-CM

## 2022-08-29 NOTE — Telephone Encounter (Signed)
Placed [facility] lab in [facility] lock box for routine lab pick up. Results pending. 

## 2022-08-29 NOTE — Progress Notes (Unsigned)
No chief complaint on file.    HISTORY OF PRESENT ILLNESS:  08/29/22 ALL:  JOHNNETTA Lindsey is a 31 y.o. female here today for follow up for RRMS. She continues Tysabri. Last infusion 08/29/22. Last JCV 0.08, negative. MRI brain and cervical spine stable 08/2021.   She feels that she is doing fairly well. No changes with gait or vision. Recent eye exam was unchanged. She works full time. No falls.   She has noticed both hands seem to fall asleep when she is using her phone. Position changes resolve symptoms.   She has had some tenderness of the top of her head. She feels that right at the crown of her head feels tender to touch. She noticed her head felt tender when having braids placed in her hair in July but tenderness of top of scalp notable for past 3 days.   She continues lorazepam 0.5mg  BID, PRN, 15 tablets last refilled 02/07/2021. This helps with anxiety and she takes sparingly. Mood is good. She is resting well.   She was seen by Dr Matilde Sprang, urology, for urinary frequency and recurrent UTIs 04/2022. She was started on Mybetric. Flomax stopped. Renal ultrasound unremarkable with exception of 2.2cm renal cyst. MRI abdomen showed 8mm cyst. No suspicious renal mass identified.   HISTORY (copied from Dr Garth Bigness previous note)  Tracey Lindsey is a 31 y.o. woman recently diagnosed with MS.      Update 02/22/2022 She is on Tysabri,   She feels stable and has no major side effects.  She notes a little hair thinning  No recent exacerbation and no new symptoms.  She is JCV Ab negative   Gait is doing well and she can walk a couple miles without a rest..   Balance is doing ok but she uses the bannister on stairs..    She denies weakness.  She notes some numbness in her left foot but usually if she sits too [place] on a hard surface (toilet)    She has urinary frequency and  3x nocturia .  She has no incontinence.  She sometimes feels she does not competely UTI.  She has had 3UTI in last 6  months.   .   Vision is the same --- right eye blurry but left and binocular is 20/20.     She has fatigue but accomplishes most of what she needs to.   She works 2 jobs..       She is having a little more trouble with sleep, esp falling back asleep after using bathroom.   She denies depression but has mild anxiety.  She rarely takes a lorazepam, usually if she has a lot of thoughts at night and gets insomnia.  Cognition is doing well.        MS History:    In retrospect, she had an episode of vertigo in 2014 with mild vision changes that improved.    In March 2016, she had vertigo that improved after a couple weeks.  In late April, she had  numbness in the left foot. When she woke up the next morning, she noted that the numbness was higher up the left arm and the next day it also involved the left hand and she went to the emergency room. In the emergency room, she had an MRI of the brain that showed white matter foci were some for multiple sclerosis.      IMAGING MRI of the brain with and without contrast 03/29/15 showed several  posterior fossa lesions with one in the right middle cerebellar peduncle and several in the pons.   The middle cerebellar peduncle focus and 2 of the pontine foci enhanced. There are also several periventricular and deep white matter foci in the cerebral hemispheres.   MRI cervical spine shows one enhancing focus.  Repeat imaging of the brain and cervical spine November 2017 did not show any new lesions. The previously enhancing lesions were smaller and did not enhance.[place] Kitchen      MRI of the brain 02/17/2020 showed Scattered T2/FLAIR hyperintense foci in the hemispheres.  Though nonspecific, the foci are consistent with the diagnosis of multiple sclerosis.  None of the foci appear to be acute and there are no new lesions compared to the 2019 MRI.   There is a partially empty sella turcica, unchanged in appearance since 2019.  This could be incidental or be associated with elevated  intracranial pressure.   MRi of the cervical spiine 02/17/2020 showed T2 hyperintense focus within the spinal cord to the left adjacent to C3-C4.  This was also present on the 2017 MRI and is consistent with a chronic demyelinating plaque associated with multiple sclerosis.  There are no degenerative changes noted   MRI of the brain and cervical spine 08/29/2021 unchanged compared to 02/17/2020   REVIEW OF SYSTEMS: Out of a complete 14 system review of symptoms, the patient complains only of the following symptoms, tenderness of scalp, tingling of bilateral hands, anxiety and all other reviewed systems are negative.   ALLERGIES: No Known Allergies   HOME MEDICATIONS: Outpatient Medications Prior to Visit  Medication Sig Dispense Refill   Ascorbic Acid (VITAMIN C PO) Take by mouth.     Cholecalciferol (VITAMIN D3) LIQD 2,000 Units by Does not apply route.     D-Mannose 500 MG CAPS      LO LOESTRIN FE 1 MG-10 MCG / 10 MCG tablet TAKE 1 TABLET BY MOUTH EVERY DAY 84 tablet 3   LORazepam (ATIVAN) 0.5 MG tablet Take 1 tablet (0.5 mg total) by mouth 2 (two) times daily as needed for anxiety. 15 tablet 1   mirabegron ER (MYRBETRIQ) 50 MG TB24 tablet Take 1 tablet (50 mg total) by mouth daily. 30 tablet 11   natalizumab (TYSABRI) 300 MG/15ML injection Inject 15 mLs into the vein every 30 (thirty) days.      Facility-Administered Medications Prior to Visit  Medication Dose Route Frequency Provider Last Rate Last Admin   gadopentetate dimeglumine (MAGNEVIST) injection 18 mL  18 mL Intravenous Once PRN Sater, Nanine Means, MD         PAST MEDICAL HISTORY: Past Medical History:  Diagnosis Date   Anxiety    Chlamydia    Depression    History of abnormal cervical Papanicolaou smear    MS (multiple sclerosis) ([place]) April 2016   PCOS (polycystic ovarian syndrome)    Vitamin D deficiency      PAST SURGICAL HISTORY: Past Surgical History:  Procedure Laterality Date   NO PAST SURGERIES        FAMILY HISTORY: Family History  Problem Relation Age of Onset   Diabetes Father    Hypertension Father    Stroke Father    Cancer Mother        lung   Hyperlipidemia Mother    Lung disease Mother    Eczema Brother    Colon cancer Maternal Aunt    Other Maternal Grandmother    Cancer Maternal Grandmother    Breast cancer  Neg Hx    Ovarian cancer Neg Hx      SOCIAL HISTORY: Social History   Socioeconomic History   Marital status: Single    Spouse name: Not on file   Number of children: Not on file   Years of education: 18   Highest education level: Master's degree (e.g., MA, MS, MEng, MEd, MSW, MBA)  Occupational History   Occupation: Holiday representative  Tobacco Use   Smoking status: Never   Smokeless tobacco: Never  Vaping Use   Vaping Use: Never used  Substance and Sexual Activity   Alcohol use: Not Currently    Comment: occasionally   Drug use: Never   Sexual activity: Yes    Partners: Male    Birth control/protection: None  Other Topics Concern   Not on file  Social History Narrative   Lives with mother    Drinks soda daily (8-10 ounces)   Social Determinants of Health   Financial Resource Strain: Low Risk  (04/28/2019)   Overall Financial Resource Strain (CARDIA)    Difficulty of Paying Living Expenses: Not hard at all  Food Insecurity: No Food Insecurity (04/28/2019)   Hunger Vital Sign    Worried About Running Out of Food in the Last Year: Never true    [place] in the Last Year: Never true  Transportation Needs: No Transportation Needs (04/28/2019)   PRAPARE - Hydrologist (Medical): No    Lack of Transportation (Non-Medical): No  Physical Activity: Inactive (04/28/2019)   Exercise Vital Sign    Days of Exercise per Week: 0 days    Minutes of Exercise per Session: 0 min  Stress: No Stress Concern Present (04/28/2019)   [place]     Feeling of Stress : Not at all  Social Connections: Somewhat Isolated (04/28/2019)   Social Connection and Isolation Panel [NHANES]    Frequency of Communication with Friends and Family: More than three times a week    Frequency of Social Gatherings with Friends and Family: Once a week    Attends Religious Services: 1 to 4 times per year    Active Member of [employer] or Organizations: No    Attends Archivist Meetings: Never    Marital Status: Never married  Intimate Partner Violence: Not At Risk (04/28/2019)   Humiliation, Afraid, Rape, and Kick questionnaire    Fear of Current or Ex-Partner: No    Emotionally Abused: No    Physically Abused: No    Sexually Abused: No     PHYSICAL EXAM  There were no vitals filed for this visit.  There is no height or weight on file to calculate BMI.  Generalized: Well developed, in no acute distress  Cardiology: normal rate and rhythm, no murmur auscultated  Respiratory: clear to auscultation bilaterally    Neurological examination  Mentation: Alert oriented to time, place, history taking. Follows all commands speech and language fluent Cranial nerve II-XII: Pupils were equal round reactive to light. Extraocular movements were full, visual field were full on confrontational test. Facial sensation and strength were normal. Uvula tongue midline. Head turning and shoulder shrug  were normal and symmetric. Motor: The motor testing reveals 5 over 5 strength of all 4 extremities. Good symmetric motor tone is noted throughout.  Sensory: Sensory testing is intact to soft touch on all 4 extremities. No evidence of extinction is noted.  Coordination: Cerebellar testing reveals good finger-nose-finger  and heel-to-shin bilaterally.  Gait and station: Gait is normal.  Reflexes: Deep tendon reflexes are symmetric and normal bilaterally.    DIAGNOSTIC DATA (LABS, IMAGING, TESTING) - I reviewed patient records, labs, notes, testing and imaging myself  where available.  Lab Results  Component Value Date   WBC 8.7 02/22/2022   HGB 13.1 02/22/2022   HCT 38.7 02/22/2022   MCV 92 02/22/2022   PLT 214 02/22/2022      Component Value Date/Time   NA 142 12/20/2021 0848   K 3.8 12/20/2021 0848   CL 103 12/20/2021 0848   CO2 25 12/20/2021 0848   GLUCOSE 79 12/20/2021 0848   GLUCOSE 84 05/30/2020 1512   BUN 10 12/20/2021 0848   CREATININE 0.76 12/20/2021 0848   CREATININE 0.72 05/30/2020 1512   CALCIUM 9.2 12/20/2021 0848   PROT 6.6 12/20/2021 0848   ALBUMIN 4.4 12/20/2021 0848   AST 16 12/20/2021 0848   ALT 12 12/20/2021 0848   ALKPHOS 49 12/20/2021 0848   BILITOT 0.5 12/20/2021 0848   GFRNONAA 118 08/09/2020 1446   GFRNONAA 114 05/30/2020 1512   GFRAA 136 08/09/2020 1446   GFRAA 132 05/30/2020 1512   Lab Results  Component Value Date   CHOL 199 12/20/2021   HDL 47 12/20/2021   LDLCALC 141 (H) 12/20/2021   TRIG 59 12/20/2021   CHOLHDL 4.2 12/20/2021   Lab Results  Component Value Date   HGBA1C 5.4 05/30/2020   Lab Results  Component Value Date   VITAMINB12 1,350 (H) 02/22/2022   Lab Results  Component Value Date   TSH 1.820 12/20/2021        No data to display               No data to display           ASSESSMENT AND PLAN  31 y.o. year old female  has a past medical history of Anxiety, Chlamydia, Depression, History of abnormal cervical Papanicolaou smear, MS (multiple sclerosis) ([place]) (April 2016), PCOS (polycystic ovarian syndrome), and Vitamin D deficiency. here with    No diagnosis found.   Chandni is doing well, today. We will continue Tysabri infusions every 4 weeks. I will update labs. We will order MRI brain and cervical spine for monitoring. I do not feel scalp tenderness or bilateral hand paresthesias are related to exacerbation but we will continue to monitor closely. She will continue PRN use of lorazepam. Healthy lifestyle habits encouraged. She will return to see Dr Felecia Shelling in 6  months.   No orders of the defined types were placed in this encounter.     No orders of the defined types were placed in this encounter.      Debbora Presto, MSN, FNP-C 08/29/2022, 1:29 PM  [facility] [address], [place], [place] [address] [phone]

## 2022-08-29 NOTE — Patient Instructions (Signed)
Below is our plan:  We will continue Tysabri infusions every 4 weeks. I will update labs. Continue lorazepam 0.5mg  as needed.   Please make sure you are staying well hydrated. I recommend 50-60 ounces daily. Well balanced diet and regular exercise encouraged. Consistent sleep schedule with 6-8 hours recommended.   Please continue follow up with care team as directed.   Follow up with Dr Felecia Shelling in 6 months  You may receive a survey regarding today's visit. I encourage you to leave honest feed back as I do use this information to improve patient care. Thank you for seeing me today!

## 2022-08-30 ENCOUNTER — Ambulatory Visit: Payer: 59 | Admitting: Family Medicine

## 2022-08-30 ENCOUNTER — Encounter: Payer: Self-pay | Admitting: Family Medicine

## 2022-08-30 VITALS — BP 118/82 | HR 79 | Ht 63.0 in | Wt 221.0 lb

## 2022-08-30 DIAGNOSIS — G35 Multiple sclerosis: Secondary | ICD-10-CM

## 2022-08-30 DIAGNOSIS — F418 Other specified anxiety disorders: Secondary | ICD-10-CM

## 2022-08-30 DIAGNOSIS — R39198 Other difficulties with micturition: Secondary | ICD-10-CM

## 2022-08-30 DIAGNOSIS — E559 Vitamin D deficiency, unspecified: Secondary | ICD-10-CM | POA: Diagnosis not present

## 2022-08-30 DIAGNOSIS — Z79899 Other long term (current) drug therapy: Secondary | ICD-10-CM | POA: Diagnosis not present

## 2022-08-30 LAB — CBC WITH DIFFERENTIAL/PLATELET
Basophils Absolute: 0.1 10*3/uL (ref 0.0–0.2)
Basos: 1 %
EOS (ABSOLUTE): 0.2 10*3/uL (ref 0.0–0.4)
Eos: 2 %
Hematocrit: 37.2 % (ref 34.0–46.6)
Hemoglobin: 12.7 g/dL (ref 11.1–15.9)
Immature Grans (Abs): 0 10*3/uL (ref 0.0–0.1)
Immature Granulocytes: 0 %
Lymphocytes Absolute: 3.3 10*3/uL — ABNORMAL HIGH (ref 0.7–3.1)
Lymphs: 37 %
MCH: 30.6 pg (ref 26.6–33.0)
MCHC: 34.1 g/dL (ref 31.5–35.7)
MCV: 90 fL (ref 79–97)
Monocytes Absolute: 0.5 10*3/uL (ref 0.1–0.9)
Monocytes: 5 %
Neutrophils Absolute: 5 10*3/uL (ref 1.4–7.0)
Neutrophils: 55 %
Platelets: 196 10*3/uL (ref 150–450)
RBC: 4.15 x10E6/uL (ref 3.77–5.28)
RDW: 12.3 % (ref 11.7–15.4)
WBC: 9.1 10*3/uL (ref 3.4–10.8)

## 2022-08-30 MED ORDER — BUSPIRONE HCL 10 MG PO TABS: 10.0000 mg | ORAL_TABLET | Freq: Two times a day (BID) | ORAL | 3 refills | Status: DC

## 2022-09-05 NOTE — Telephone Encounter (Signed)
JCV antibody came back negative with a index value of 0.14. Will place in review bin for Amy

## 2022-09-06 DIAGNOSIS — F411 Generalized anxiety disorder: Secondary | ICD-10-CM | POA: Diagnosis not present

## 2022-09-07 DIAGNOSIS — R350 Frequency of micturition: Secondary | ICD-10-CM | POA: Diagnosis not present

## 2022-09-20 DIAGNOSIS — F411 Generalized anxiety disorder: Secondary | ICD-10-CM | POA: Diagnosis not present

## 2022-09-26 DIAGNOSIS — G35 Multiple sclerosis: Secondary | ICD-10-CM | POA: Diagnosis not present

## 2022-09-27 ENCOUNTER — Other Ambulatory Visit (HOSPITAL_COMMUNITY): Payer: Self-pay

## 2022-09-27 MED ORDER — MIRABEGRON ER 50 MG PO TB24
50.0000 mg | ORAL_TABLET | Freq: Every day | ORAL | 11 refills | Status: DC
  Filled 2022-09-27: qty 30, 30d supply, fill #0

## 2022-10-03 ENCOUNTER — Other Ambulatory Visit (HOSPITAL_COMMUNITY): Payer: Self-pay

## 2022-10-04 ENCOUNTER — Other Ambulatory Visit (HOSPITAL_COMMUNITY): Payer: Self-pay

## 2022-10-04 DIAGNOSIS — F411 Generalized anxiety disorder: Secondary | ICD-10-CM | POA: Diagnosis not present

## 2022-10-04 MED ORDER — BUSPIRONE HCL 10 MG PO TABS
10.0000 mg | ORAL_TABLET | Freq: Two times a day (BID) | ORAL | 3 refills | Status: DC
  Filled 2022-10-04: qty 180, 90d supply, fill #0
  Filled 2023-04-17: qty 180, 90d supply, fill #1
  Filled 2023-07-26: qty 180, 90d supply, fill #2

## 2022-10-18 DIAGNOSIS — F411 Generalized anxiety disorder: Secondary | ICD-10-CM | POA: Diagnosis not present

## 2022-10-24 DIAGNOSIS — G35 Multiple sclerosis: Secondary | ICD-10-CM | POA: Diagnosis not present

## 2022-11-01 DIAGNOSIS — F411 Generalized anxiety disorder: Secondary | ICD-10-CM | POA: Diagnosis not present

## 2022-11-08 DIAGNOSIS — F411 Generalized anxiety disorder: Secondary | ICD-10-CM | POA: Diagnosis not present

## 2022-11-21 DIAGNOSIS — G35 Multiple sclerosis: Secondary | ICD-10-CM | POA: Diagnosis not present

## 2022-11-29 DIAGNOSIS — F411 Generalized anxiety disorder: Secondary | ICD-10-CM | POA: Diagnosis not present

## 2022-12-26 ENCOUNTER — Ambulatory Visit (INDEPENDENT_AMBULATORY_CARE_PROVIDER_SITE_OTHER): Payer: Commercial Managed Care - PPO | Admitting: Obstetrics and Gynecology

## 2022-12-26 ENCOUNTER — Other Ambulatory Visit (HOSPITAL_COMMUNITY)
Admission: RE | Admit: 2022-12-26 | Discharge: 2022-12-26 | Disposition: A | Discharge status: Home / Self Care | Payer: Commercial Managed Care - PPO | Source: Ambulatory Visit | Attending: Obstetrics and Gynecology | Admitting: Obstetrics and Gynecology

## 2022-12-26 VITALS — BP 127/83 | HR 93 | Resp 16 | Ht 63.0 in | Wt 223.7 lb

## 2022-12-26 DIAGNOSIS — Z01419 Encounter for gynecological examination (general) (routine) without abnormal findings: Secondary | ICD-10-CM

## 2022-12-26 DIAGNOSIS — Z6839 Body mass index (BMI) 39.0-39.9, adult: Secondary | ICD-10-CM | POA: Diagnosis not present

## 2022-12-26 DIAGNOSIS — Z113 Encounter for screening for infections with a predominantly sexual mode of transmission: Secondary | ICD-10-CM | POA: Diagnosis not present

## 2022-12-26 DIAGNOSIS — Z131 Encounter for screening for diabetes mellitus: Secondary | ICD-10-CM

## 2022-12-26 DIAGNOSIS — E559 Vitamin D deficiency, unspecified: Secondary | ICD-10-CM | POA: Diagnosis not present

## 2022-12-26 DIAGNOSIS — R350 Frequency of micturition: Secondary | ICD-10-CM

## 2022-12-26 DIAGNOSIS — Z01411 Encounter for gynecological examination (general) (routine) with abnormal findings: Secondary | ICD-10-CM | POA: Diagnosis not present

## 2022-12-26 DIAGNOSIS — Z124 Encounter for screening for malignant neoplasm of cervix: Secondary | ICD-10-CM | POA: Insufficient documentation

## 2022-12-26 DIAGNOSIS — E6609 Other obesity due to excess calories: Secondary | ICD-10-CM | POA: Diagnosis not present

## 2022-12-26 DIAGNOSIS — Z13220 Encounter for screening for lipoid disorders: Secondary | ICD-10-CM | POA: Diagnosis not present

## 2022-12-26 DIAGNOSIS — E66812 Obesity, class 2: Secondary | ICD-10-CM

## 2022-12-26 LAB — POCT URINALYSIS DIPSTICK
Bilirubin, UA: NEGATIVE
Glucose, UA: NEGATIVE
Ketones, UA: NEGATIVE
Leukocytes, UA: NEGATIVE
Nitrite, UA: NEGATIVE
Protein, UA: POSITIVE — AB
Spec Grav, UA: 1.02 (ref 1.010–1.025)
Urobilinogen, UA: 0.2 E.U./dL
pH, UA: 7 (ref 5.0–8.0)

## 2022-12-26 NOTE — Progress Notes (Signed)
GYNECOLOGY ANNUAL PHYSICAL EXAM PROGRESS NOTE  Subjective:    Tracey Lindsey is a 32 y.o. G20P0010 female who presents for an annual exam.  The patient not currently sexually active. The patient participates in regular exercise: no. Has the patient ever been transfused or tattooed?: no. The patient reports that there is not domestic violence in her life.   The patient has the following complaints today:  Notes urinary frequency. Tried Myrbetriq for a short period of time, notes that it was only partially helpful. Has been evaluated by Urology, and has had 2 CT scans to assess for kidney stones or other causes due to her history of MS.   Menstrual History: Menarche age: 69 Patient's last menstrual period was 12/15/2022.  Period Cycle (Days): 28 Period Duration (Days): 4-5 Period Pattern: Regular Menstrual Flow: Moderate Menstrual Control: Maxi pad, Tampon Menstrual Control Change Freq (Hours): 2-3 Dysmenorrhea: (!) Mild Dysmenorrhea Symptoms: Cramping, Other (Comment), Nausea (Back Pain)    Gynecologic History:  Contraception: none History of STI's: Denies Last Pap: 11/05/2016. Results were: normal.  Denies h/o abnormal pap smears.    Office Visit from 12/26/2022 in [facility]    12/26/2022   [id number]  Pregnancy Intention Screening   Does the patient want to become pregnant in the next year? No  Does the patient's partner want to become pregnant in the next year? No  Would the patient like to discuss contraceptive options today? No  Contraception Wrap Up   Current Method No Contraceptive Precautions  End Method No Contraception Precautions  Contraception Counseling Provided No  How was the end contraceptive method provided? N/A    The pregnancy intention screening data noted above was reviewed. Potential methods of contraception were discussed. The patient elected to proceed with No Contraception Precautions.   OB History  Gravida Para  Term Preterm AB Living  1 0 0 0 1 0  SAB IAB Ectopic Multiple Live Births  0 1 0 0 0    # Outcome Date GA Lbr Len/2nd Weight Sex Delivery Anes PTL Lv  1 IAB 2017            Past Medical History:  Diagnosis Date   Anxiety    Chlamydia    Depression    History of abnormal cervical Papanicolaou smear    MS (multiple sclerosis) ([place]) April 2016   PCOS (polycystic ovarian syndrome)    Vitamin D deficiency     Past Surgical History:  Procedure Laterality Date   NO PAST SURGERIES      Family History  Problem Relation Age of Onset   Diabetes Father    Hypertension Father    Stroke Father    Cancer Mother        lung   Hyperlipidemia Mother    Lung disease Mother    Eczema Brother    Colon cancer Maternal Aunt    Other Maternal Grandmother    Cancer Maternal Grandmother    Breast cancer Neg Hx    Ovarian cancer Neg Hx     Social History   Socioeconomic History   Marital status: Single    Spouse name: Not on file   Number of children: Not on file   Years of education: 18   Highest education level: Master's degree (e.g., MA, MS, MEng, MEd, MSW, MBA)  Occupational History   Occupation: Holiday representative  Tobacco Use   Smoking status: Never   Smokeless tobacco: Never  Vaping  Use   Vaping Use: Never used  Substance and Sexual Activity   Alcohol use: Not Currently    Comment: occasionally   Drug use: Never   Sexual activity: Yes    Partners: Male    Birth control/protection: None  Other Topics Concern   Not on file  Social History Narrative   Lives with mother    Drinks soda daily (8-10 ounces)   Social Determinants of Health   Financial Resource Strain: Low Risk  (04/28/2019)   Overall Financial Resource Strain (CARDIA)    Difficulty of Paying Living Expenses: Not hard at all  Food Insecurity: No Food Insecurity (04/28/2019)   Hunger Vital Sign    Worried About Running Out of Food in the Last Year: Never true    [place] in the Last Year:  Never true  Transportation Needs: No Transportation Needs (04/28/2019)   PRAPARE - Hydrologist (Medical): No    Lack of Transportation (Non-Medical): No  Physical Activity: Inactive (04/28/2019)   Exercise Vital Sign    Days of Exercise per Week: 0 days    Minutes of Exercise per Session: 0 min  Stress: No Stress Concern Present (04/28/2019)   [place]    Feeling of Stress : Not at all  Social Connections: Somewhat Isolated (04/28/2019)   Social Connection and Isolation Panel [NHANES]    Frequency of Communication with Friends and Family: More than three times a week    Frequency of Social Gatherings with Friends and Family: Once a week    Attends Religious Services: 1 to 4 times per year    Active Member of [employer] or Organizations: No    Attends Archivist Meetings: Never    Marital Status: Never married  Intimate Partner Violence: Not At Risk (04/28/2019)   Humiliation, Afraid, Rape, and Kick questionnaire    Fear of Current or Ex-Partner: No    Emotionally Abused: No    Physically Abused: No    Sexually Abused: No    Current Outpatient Medications on File Prior to Visit  Medication Sig Dispense Refill   Ascorbic Acid (VITAMIN C PO) Take by mouth.     busPIRone (BUSPAR) 10 MG tablet Take 1 tablet (10 mg total) by mouth 2 (two) times daily. 180 tablet 3   busPIRone (BUSPAR) 10 MG tablet Take 1 tablet (10 mg total) by mouth 2 (two) times daily. 180 tablet 3   Cholecalciferol (VITAMIN D3) LIQD 2,000 Units by Does not apply route.     D-Mannose 500 MG CAPS      LORazepam (ATIVAN) 0.5 MG tablet Take 1 tablet (0.5 mg total) by mouth 2 (two) times daily as needed for anxiety. 15 tablet 1   mirabegron ER (MYRBETRIQ) 50 MG TB24 tablet Take 1 tablet (50 mg total) by mouth daily. 30 tablet 11   natalizumab (TYSABRI) 300 MG/15ML injection Inject 15 mLs into the vein every 30 (thirty) days.       Current Facility-Administered Medications on File Prior to Visit  Medication Dose Route Frequency Provider Last Rate Last Admin   gadopentetate dimeglumine (MAGNEVIST) injection 18 mL  18 mL Intravenous Once PRN Sater, Nanine Means, MD        No Known Allergies   Review of Systems Constitutional: negative for chills, fatigue, fevers and sweats Eyes: negative for irritation, redness and visual disturbance Ears, nose, mouth, throat, and face: negative for hearing loss, nasal  congestion, snoring and tinnitus Respiratory: negative for asthma, cough, sputum Cardiovascular: negative for chest pain, dyspnea, exertional chest pressure/discomfort, irregular heart beat, palpitations and syncope Gastrointestinal: negative for abdominal pain, change in bowel habits, nausea and vomiting Genitourinary: negative for abnormal menstrual periods, genital lesions, sexual problems and vaginal discharge, dysuria and urinary incontinence Integument/breast: negative for breast lump, breast tenderness and nipple discharge Hematologic/lymphatic: negative for bleeding and easy bruising Musculoskeletal:negative for back pain and muscle weakness Neurological: negative for dizziness, headaches, vertigo and weakness Endocrine: negative for diabetic symptoms including polydipsia, polyuria and skin dryness Allergic/Immunologic: negative for hay fever and urticaria      Objective:  Blood pressure 127/83, pulse 93, resp. rate 16, height 5\' 3"  (1.6 m), weight 223 lb 11.2 oz (101.5 kg), last menstrual period 12/15/2022. Body mass index is 39.63 kg/m.    General Appearance:    Alert, cooperative, no distress, appears stated age  Head:    Normocephalic, without obvious abnormality, atraumatic  Eyes:    PERRL, conjunctiva/corneas clear, EOM's intact, both eyes  Ears:    Normal external ear canals, both ears  Nose:   Nares normal, septum midline, mucosa normal, no drainage or sinus tenderness  Throat:   Lips, mucosa,  and tongue normal; teeth and gums normal  Neck:   Supple, symmetrical, trachea midline, no adenopathy; thyroid: no enlargement/tenderness/nodules; no carotid bruit or JVD  Back:     Symmetric, no curvature, ROM normal, no CVA tenderness  Lungs:     Clear to auscultation bilaterally, respirations unlabored  Chest Wall:    No tenderness or deformity   Heart:    Regular rate and rhythm, S1 and S2 normal, no murmur, rub or gallop  Breast Exam:    No tenderness, masses, or nipple abnormality  Abdomen:     Soft, non-tender, bowel sounds active all four quadrants, no masses, no organomegaly.    Genitalia:    Pelvic:external genitalia normal, vagina without lesions, discharge, or tenderness, rectovaginal septum  normal. Cervix normal in appearance, no cervical motion tenderness, no adnexal masses or tenderness.  Uterus normal size, shape, mobile, regular contours, nontender.  Rectal:    Normal external sphincter.  No hemorrhoids appreciated. Internal exam not done.   Extremities:   Extremities normal, atraumatic, no cyanosis or edema  Pulses:   2+ and symmetric all extremities  Skin:   Skin color, texture, turgor normal, no rashes or lesions  Lymph nodes:   Cervical, supraclavicular, and axillary nodes normal  Neurologic:   CNII-XII intact, normal strength, sensation and reflexes throughout   .  Labs:  Lab Results  Component Value Date   WBC 9.1 08/29/2022   HGB 12.7 08/29/2022   HCT 37.2 08/29/2022   MCV 90 08/29/2022   PLT 196 08/29/2022    Lab Results  Component Value Date   CREATININE 0.76 12/20/2021   BUN 10 12/20/2021   NA 142 12/20/2021   K 3.8 12/20/2021   CL 103 12/20/2021   CO2 25 12/20/2021    Lab Results  Component Value Date   ALT 12 12/20/2021   AST 16 12/20/2021   ALKPHOS 49 12/20/2021   BILITOT 0.5 12/20/2021    Lab Results  Component Value Date   TSH 1.820 12/20/2021     No results found for: "LABURIC" Urinalysis Results for orders placed or performed in  visit on 12/26/22  POCT Urinalysis Dipstick  Result Value Ref Range   Color, UA     Clarity, UA     Glucose,  UA Negative Negative   Bilirubin, UA Negative    Ketones, UA Negative    Spec Grav, UA 1.020 1.010 - 1.025   Blood, UA Trace    pH, UA 7.0 5.0 - 8.0   Protein, UA Positive (A) Negative   Urobilinogen, UA 0.2 0.2 or 1.0 E.U./dL   Nitrite, UA Negative    Leukocytes, UA Negative Negative   Appearance     Odor        Assessment:   1. Encounter for well woman exam with routine gynecological exam   2. Vitamin D deficiency   3. Class 2 obesity due to excess calories without serious comorbidity with body mass index (BMI) of 39.0 to 39.9 in adult   4. Screening for diabetes mellitus (DM)   5. Screening cholesterol level   6. Screen for STD (sexually transmitted disease)   7. Cervical cancer screening   8. Urinary frequency      Plan:  Blood tests: See labs ordered.  Breast self exam technique reviewed and patient encouraged to perform self-exam monthly. Contraception: none. Ambivalent if pregnancy.  Discussed healthy lifestyle modifications.  Mammogram  : Not age appropriate Pap smear ordered for next visit. COVID vaccination status: Declines Flu Vaccine: up to date History of vitamin D deficiency, will screen today.  Desire routine STD screening.  Urinary frequency - discussed it could be related to uterine size (also with h/o small fibroid), or her MS.  Follow up in 1 year for annual exam   Hildred Laser, MD [facility]

## 2022-12-26 NOTE — Patient Instructions (Signed)
Breast Self-Awareness Breast self-awareness is knowing how your breasts look and feel. You need to: Check your breasts on a regular basis. Tell your doctor about any changes. Become familiar with the look and feel of your breasts. This can help you catch a breast problem while it is still small and can be treated. You should do breast self-exams even if you have breast implants. What you need: A mirror. A well-lit room. A pillow or other soft object. How to do a breast self-exam Follow these steps to do a breast self-exam: Look for changes  Take off all the clothes above your waist. Stand in front of a mirror in a room with good lighting. Put your hands down at your sides. Compare your breasts in the mirror. Look for any difference between them, such as: A difference in shape. A difference in size. Wrinkles, dips, and bumps in one breast and not the other. Look at each breast for changes in the skin, such as: Redness. Scaly areas. Skin that has gotten thicker. Dimpling. Open sores (ulcers). Look for changes in your nipples, such as: Fluid coming out of a nipple. Fluid around a nipple. Bleeding. Dimpling. Redness. A nipple that looks pushed in (retracted), or that has changed position. Feel for changes Lie on your back. Feel each breast. To do this: Pick a breast to feel. Place a pillow under the shoulder closest to that breast. Put the arm closest to that breast behind your head. Feel the nipple area of that breast using the hand of your other arm. Feel the area with the pads of your three middle fingers by making small circles with your fingers. Use light, medium, and firm pressure. Continue the overlapping circles, moving downward over the breast. Keep making circles with your fingers. Stop when you feel your ribs. Start making circles with your fingers again, this time going upward until you reach your collarbone. Then, make circles outward across your breast and into your  armpit area. Squeeze your nipple. Check for discharge and lumps. Repeat these steps to check your other breast. Sit or stand in the tub or shower. With soapy water on your skin, feel each breast the same way you did when you were lying down. Write down what you find Writing down what you find can help you remember what to tell your doctor. Write down: What is normal for each breast. Any changes you find in each breast. These include: The kind of changes you find. A tender or painful breast. Any lump you find. Write down its size and where it is. When you last had your monthly period (menstrual cycle). General tips If you are breastfeeding, the best time to check your breasts is after you feed your baby or after you use a breast pump. If you get monthly bleeding, the best time to check your breasts is 5-7 days after your monthly cycle ends. With time, you will become comfortable with the self-exam. You will also start to know if there are changes in your breasts. Contact a doctor if: You see a change in the shape or size of your breasts or nipples. You see a change in the skin of your breast or nipples, such as red or scaly skin. You have fluid coming from your nipples that is not normal. You find a new lump or thick area. You have breast pain. You have any concerns about your breast health. Summary Breast self-awareness includes looking for changes in your breasts and feeling for changes   yoga, or listening to music. Journaling. Talking to a trusted person. Spending time with friends and family. Minimize exposure to UV radiation to reduce your risk of skin  cancer. Safety Always wear your seat belt while driving or riding in a vehicle. Do not drive: If you have been drinking alcohol. Do not ride with someone who has been drinking. If you have been using any mind-altering substances or drugs. While texting. When you are tired or distracted. Wear a helmet and other protective equipment during sports activities. If you have firearms in your house, make sure you follow all gun safety procedures. Seek help if you have been physically or sexually abused. What's next? Go to your health care provider once a year for an annual wellness visit. Ask your health care provider how often you should have your eyes and teeth checked. Stay up to date on all vaccines. This information is not intended to replace advice given to you by your health care provider. Make sure you discuss any questions you have with your health care provider. Document Revised: 05/17/2021 Document Reviewed: 05/17/2021 Elsevier Patient Education  2023 Elsevier Inc.    night with fluoride toothpaste. Floss one time each day. Exercise for at least 30 minutes 5 or more days each week. Do not use any products that contain nicotine or tobacco. These products include cigarettes, chewing tobacco, and vaping devices, such as e-cigarettes. If you need help quitting, ask your health care provider. Do not use drugs. If you are sexually active, practice safe sex. Use a condom or other form of protection to prevent STIs. If you do not wish to become pregnant, use a form of birth control. If you plan to become pregnant, see your health care provider for a prepregnancy visit. Find healthy ways to manage stress, such as: Meditation, yoga, or  listening to music. Journaling. Talking to a trusted person. Spending time with friends and family. Minimize exposure to UV radiation to reduce your risk of skin cancer. Safety Always wear your seat belt while driving or riding in a vehicle. Do not drive: If you have been drinking alcohol. Do not ride with someone who has been drinking. If you have been using any mind-altering substances or drugs. While texting. When you are tired or distracted. Wear a helmet and other protective equipment during sports activities. If you have firearms in your house, make sure you follow all gun safety procedures. Seek help if you have been physically or sexually abused. What's next? Go to your health care provider once a year for an annual wellness visit. Ask your health care provider how often you should have your eyes and teeth checked. Stay up to date on all vaccines. This information is not intended to replace advice given to you by your health care provider. Make sure you discuss any questions you have with your health care provider. Document Revised: 05/17/2021 Document Reviewed: 05/17/2021 Elsevier Patient Education  2023 Elsevier Inc.  

## 2022-12-27 LAB — CERVICOVAGINAL ANCILLARY ONLY
Bacterial Vaginitis (gardnerella): NEGATIVE
Candida Glabrata: NEGATIVE
Candida Vaginitis: NEGATIVE
Comment: NEGATIVE
Comment: NEGATIVE
Comment: NEGATIVE
Comment: NEGATIVE
Trichomonas: NEGATIVE

## 2022-12-27 LAB — RPR: RPR Ser Ql: NONREACTIVE

## 2022-12-27 LAB — COMPREHENSIVE METABOLIC PANEL
ALT: 11 IU/L (ref 0–32)
AST: 14 IU/L (ref 0–40)
Albumin/Globulin Ratio: 2.1 (ref 1.2–2.2)
Albumin: 4.4 g/dL (ref 3.9–4.9)
Alkaline Phosphatase: 51 IU/L (ref 44–121)
BUN/Creatinine Ratio: 12 (ref 9–23)
BUN: 8 mg/dL (ref 6–20)
Bilirubin Total: 0.3 mg/dL (ref 0.0–1.2)
CO2: 23 mmol/L (ref 20–29)
Calcium: 9 mg/dL (ref 8.7–10.2)
Chloride: 105 mmol/L (ref 96–106)
Creatinine, Ser: 0.66 mg/dL (ref 0.57–1.00)
Globulin, Total: 2.1 g/dL (ref 1.5–4.5)
Glucose: 87 mg/dL (ref 70–99)
Potassium: 3.9 mmol/L (ref 3.5–5.2)
Sodium: 140 mmol/L (ref 134–144)
Total Protein: 6.5 g/dL (ref 6.0–8.5)
eGFR: 120 mL/min/{1.73_m2} (ref >59)

## 2022-12-27 LAB — LIPID PANEL
Chol/HDL Ratio: 4.5 ratio — ABNORMAL HIGH (ref 0.0–4.4)
Cholesterol, Total: 205 mg/dL — ABNORMAL HIGH (ref 100–199)
HDL: 46 mg/dL (ref >39)
LDL Chol Calc (NIH): 145 mg/dL — ABNORMAL HIGH (ref 0–99)
Triglycerides: 75 mg/dL (ref 0–149)
VLDL Cholesterol Cal: 14 mg/dL (ref 5–40)

## 2022-12-27 LAB — CBC
Hematocrit: 36 % (ref 34.0–46.6)
Hemoglobin: 12.6 g/dL (ref 11.1–15.9)
MCH: 31.1 pg (ref 26.6–33.0)
MCHC: 35 g/dL (ref 31.5–35.7)
MCV: 89 fL (ref 79–97)
Platelets: 168 10*3/uL (ref 150–450)
RBC: 4.05 x10E6/uL (ref 3.77–5.28)
RDW: 12.8 % (ref 11.7–15.4)
WBC: 7.6 10*3/uL (ref 3.4–10.8)

## 2022-12-27 LAB — HIV ANTIBODY (ROUTINE TESTING W REFLEX): HIV Screen 4th Generation wRfx: NONREACTIVE

## 2022-12-27 LAB — HEPATITIS B SURFACE ANTIGEN: Hepatitis B Surface Ag: NEGATIVE

## 2022-12-27 LAB — HEPATITIS C ANTIBODY: Hep C Virus Ab: NONREACTIVE

## 2022-12-27 LAB — HSV 1 AND 2 AB, IGG
HSV 1 Glycoprotein G Ab, IgG: <0.91 index (ref 0.00–0.90)
HSV 2 IgG, Type Spec: <0.91 index (ref 0.00–0.90)

## 2022-12-27 LAB — TSH: TSH: 2.27 u[IU]/mL (ref 0.450–4.500)

## 2022-12-27 LAB — HEMOGLOBIN A1C
Est. average glucose Bld gHb Est-mCnc: 111 mg/dL
Hgb A1c MFr Bld: 5.5 % (ref 4.8–5.6)

## 2022-12-27 LAB — VITAMIN D 25 HYDROXY (VIT D DEFICIENCY, FRACTURES): Vit D, 25-Hydroxy: 49.9 ng/mL (ref 30.0–100.0)

## 2022-12-29 ENCOUNTER — Encounter: Payer: Self-pay | Admitting: Obstetrics and Gynecology

## 2023-01-01 LAB — CYTOLOGY - PAP
Chlamydia: NEGATIVE
Comment: NEGATIVE
Comment: NEGATIVE
Comment: NEGATIVE
Comment: NORMAL
Diagnosis: NEGATIVE
High risk HPV: NEGATIVE
Neisseria Gonorrhea: NEGATIVE
Trichomonas: NEGATIVE

## 2023-01-06 DIAGNOSIS — R350 Frequency of micturition: Secondary | ICD-10-CM | POA: Diagnosis not present

## 2023-01-06 DIAGNOSIS — N3001 Acute cystitis with hematuria: Secondary | ICD-10-CM | POA: Diagnosis not present

## 2023-02-18 ENCOUNTER — Telehealth: Payer: Self-pay

## 2023-02-18 ENCOUNTER — Other Ambulatory Visit: Payer: Self-pay

## 2023-02-18 ENCOUNTER — Other Ambulatory Visit: Payer: Self-pay | Admitting: *Deleted

## 2023-02-18 DIAGNOSIS — Z79899 Other long term (current) drug therapy: Secondary | ICD-10-CM

## 2023-02-18 DIAGNOSIS — G35 Multiple sclerosis: Secondary | ICD-10-CM

## 2023-02-18 DIAGNOSIS — G35D Multiple sclerosis, unspecified: Secondary | ICD-10-CM

## 2023-02-18 NOTE — Telephone Encounter (Signed)
Placed JCV AB Lab in [employer] for Pick-up. Results pending.  02/18/2023

## 2023-02-19 LAB — CBC WITH DIFFERENTIAL
Basophils Absolute: 0.1 10*3/uL (ref 0.0–0.2)
Basos: 1 %
EOS (ABSOLUTE): 0.2 10*3/uL (ref 0.0–0.4)
Eos: 2 %
Hematocrit: 37.2 % (ref 34.0–46.6)
Hemoglobin: 12.6 g/dL (ref 11.1–15.9)
Immature Grans (Abs): 0 10*3/uL (ref 0.0–0.1)
Immature Granulocytes: 0 %
Lymphocytes Absolute: 3.3 10*3/uL — ABNORMAL HIGH (ref 0.7–3.1)
Lymphs: 42 %
MCH: 30.6 pg (ref 26.6–33.0)
MCHC: 33.9 g/dL (ref 31.5–35.7)
MCV: 90 fL (ref 79–97)
Monocytes Absolute: 0.5 10*3/uL (ref 0.1–0.9)
Monocytes: 6 %
Neutrophils Absolute: 3.9 10*3/uL (ref 1.4–7.0)
Neutrophils: 49 %
RBC: 4.12 x10E6/uL (ref 3.77–5.28)
RDW: 12.9 % (ref 11.7–15.4)
WBC: 7.9 10*3/uL (ref 3.4–10.8)

## 2023-02-20 ENCOUNTER — Telehealth: Payer: Commercial Managed Care - PPO | Admitting: Obstetrics and Gynecology

## 2023-02-25 NOTE — Telephone Encounter (Signed)
JCV ab drawn on 02/18/23 negative, index: 0.13.

## 2023-02-28 ENCOUNTER — Ambulatory Visit: Payer: Commercial Managed Care - PPO | Admitting: Neurology

## 2023-02-28 ENCOUNTER — Encounter: Payer: Self-pay | Admitting: Neurology

## 2023-02-28 VITALS — BP 122/83 | HR 94 | Ht 63.0 in | Wt 228.6 lb

## 2023-02-28 DIAGNOSIS — Z79899 Other long term (current) drug therapy: Secondary | ICD-10-CM

## 2023-02-28 DIAGNOSIS — F418 Other specified anxiety disorders: Secondary | ICD-10-CM

## 2023-02-28 DIAGNOSIS — R39198 Other difficulties with micturition: Secondary | ICD-10-CM

## 2023-02-28 DIAGNOSIS — G35 Multiple sclerosis: Secondary | ICD-10-CM | POA: Diagnosis not present

## 2023-02-28 DIAGNOSIS — G35D Multiple sclerosis, unspecified: Secondary | ICD-10-CM

## 2023-02-28 NOTE — Progress Notes (Signed)
[facility]  PATIENT: Tracey Lindsey DOB: 01-15-1991  REFERRING DOCTOR OR PCP:  Gennette Pac Cyril Mourning) SOURCE: Patient and ER records and PACS images  _________________________________   HISTORICAL  CHIEF COMPLAINT:  Chief Complaint  Patient presents with   Follow-up    Pt in room 11 here for MS follow up. Pt states doing well. Pt noticed her arm gets sore if too mush usage.     HISTORY OF PRESENT ILLNESS:  Tracey Lindsey is a 32 y.o. woman recently diagnosed with MS.     Update 02/28/2023 She is on Tysabri,   she tolerates it well.  Her MS has been stable with no recent exacerbations or new neurologic symptoms.     She is JCV Ab negative 02/18/2023  She denies any major problems with her gait and can walk a couple miles.   Balance is doing ok but she uses the bannister on stairs for safety but can go down without it..    She denies weakness.  She has intermittent numbness in the left foot if she sits for [place] time..    She gets sore in the left upper arm/shoulder when she does more activity.      She has urinary frequency and  3x nocturia .  She has no incontinence.  She denies hesitancy.    Two UTI since the last visit.   Vision is ok but she notes some blurriness in the distance.    This is symmetric.   Colors are symmetric.      She has fatigue daily but accomplishes most of what she needs to.   She works 2 jobs..    She has had more stress lately with parent health so has had some sleep onset insomnia but usually does well.   She denies depression but has mid anxiety helped by Buspar.    Cognition is doing well.      MS History:    In retrospect, she had an episode of vertigo in 2014 with mild vision changes that improved.    In March 2016, she had vertigo that improved after a couple weeks.  In late April, she had  numbness in the left foot. When she woke up the next morning, she noted that the numbness was higher up the left arm and the next day  it also involved the left hand and she went to the emergency room. In the emergency room, she had an MRI of the brain that showed white matter foci were some for multiple sclerosis.     IMAGING MRI of the brain with and without contrast 03/29/15 showed several posterior fossa lesions with one in the right middle cerebellar peduncle and several in the pons.   The middle cerebellar peduncle focus and 2 of the pontine foci enhanced. There are also several periventricular and deep white matter foci in the cerebral hemispheres.   MRI cervical spine shows one enhancing focus.  Repeat imaging of the brain and cervical spine November 2017 did not show any new lesions. The previously enhancing lesions were smaller and did not enhance.Marland Kitchen     MRI of the brain 02/17/2020 showed Scattered T2/FLAIR hyperintense foci in the hemispheres.  Though nonspecific, the foci are consistent with the diagnosis of multiple sclerosis.  None of the foci appear to be acute and there are no new lesions compared to the 2019 MRI.   There is a partially empty sella turcica, unchanged in appearance since 2019.  This could  be incidental or be associated with elevated intracranial pressure.  MRi of the cervical spiine 02/17/2020 showed T2 hyperintense focus within the spinal cord to the left adjacent to C3-C4.  This was also present on the 2017 MRI and is consistent with a chronic demyelinating plaque associated with multiple sclerosis.  There are no degenerative changes noted  MRI of the brain and cervical spine 08/29/2021 unchanged compared to 02/17/2020  REVIEW OF SYSTEMS: Constitutional: No fevers, chills, sweats, or change in appetite.   She notes some fatigue. Eyes: No visual changes, double vision, eye pain Ear, nose and throat: No hearing loss, ear pain, nasal congestion, sore throat Cardiovascular: No chest pain, palpitations Respiratory:  No shortness of breath at rest or with exertion.   No wheezes GastrointestinaI: No nausea,  vomiting, diarrhea, abdominal pain, fecal incontinence Genitourinary:  Notes frequency, nocturia (x2).   She has PCOS Musculoskeletal:  No neck pain, back pain Integumentary: No rash, pruritus, skin lesions Neurological: as above Psychiatric: as above  Endocrine: No palpitations, diaphoresis, change in appetite, change in weigh or increased thirst Hematologic/Lymphatic:  No anemia, purpura, petechiae. Allergic/Immunologic: No itchy/runny eyes, nasal congestion, recent allergic reactions, rashes  ALLERGIES: No Known Allergies  HOME MEDICATIONS:  Current Outpatient Medications:    Ascorbic Acid (VITAMIN C PO), Take by mouth., Disp: , Rfl:    busPIRone (BUSPAR) 10 MG tablet, Take 1 tablet (10 mg total) by mouth 2 (two) times daily., Disp: 180 tablet, Rfl: 3   Cholecalciferol (VITAMIN D3) LIQD, 2,000 Units by Does not apply route., Disp: , Rfl:    Cyanocobalamin (B-12 IJ), Inject as directed., Disp: , Rfl:    D-Mannose 500 MG CAPS, , Disp: , Rfl:    natalizumab (TYSABRI) 300 MG/15ML injection, Inject 15 mLs into the vein every 30 (thirty) days. , Disp: , Rfl:    LORazepam (ATIVAN) 0.5 MG tablet, Take 1 tablet (0.5 mg total) by mouth 2 (two) times daily as needed for anxiety. (Patient not taking: Reported on 02/28/2023), Disp: 15 tablet, Rfl: 1 No current facility-administered medications for this visit.  Facility-Administered Medications Ordered in Other Visits:    gadopentetate dimeglumine (MAGNEVIST) injection 18 mL, 18 mL, Intravenous, Once PRN, Baran Kuhrt, Nanine Means, MD  PAST MEDICAL HISTORY: Past Medical History:  Diagnosis Date   Anxiety    Chlamydia    Depression    History of abnormal cervical Papanicolaou smear    MS (multiple sclerosis) ([place]) April 2016   PCOS (polycystic ovarian syndrome)    Vitamin D deficiency     PAST SURGICAL HISTORY: Past Surgical History:  Procedure Laterality Date   NO PAST SURGERIES      FAMILY HISTORY: Family History  Problem Relation Age of  Onset   Diabetes Father    Hypertension Father    Stroke Father    Cancer Mother        lung   Hyperlipidemia Mother    Lung disease Mother    Eczema Brother    Colon cancer Maternal Aunt    Other Maternal Grandmother    Cancer Maternal Grandmother    Breast cancer Neg Hx    Ovarian cancer Neg Hx     SOCIAL HISTORY:  Social History   Socioeconomic History   Marital status: Single    Spouse name: Not on file   Number of children: Not on file   Years of education: 18   Highest education level: Master's degree (e.g., MA, MS, MEng, MEd, MSW, MBA)  Occupational History  Occupation: Holiday representative  Tobacco Use   Smoking status: Never   Smokeless tobacco: Never  Vaping Use   Vaping Use: Never used  Substance and Sexual Activity   Alcohol use: Not Currently    Comment: occasionally   Drug use: Never   Sexual activity: Yes    Partners: Male    Birth control/protection: None  Other Topics Concern   Not on file  Social History Narrative   Lives with mother    Drinks soda daily (8-10 ounces)   Social Determinants of Health   Financial Resource Strain: Low Risk  (04/28/2019)   Overall Financial Resource Strain (CARDIA)    Difficulty of Paying Living Expenses: Not hard at all  Food Insecurity: No Food Insecurity (04/28/2019)   Hunger Vital Sign    Worried About Running Out of Food in the Last Year: Never true    Ran Out of Food in the Last Year: Never true  Transportation Needs: No Transportation Needs (04/28/2019)   PRAPARE - Hydrologist (Medical): No    Lack of Transportation (Non-Medical): No  Physical Activity: Inactive (04/28/2019)   Exercise Vital Sign    Days of Exercise per Week: 0 days    Minutes of Exercise per Session: 0 min  Stress: No Stress Concern Present (04/28/2019)   [place]    Feeling of Stress : Not at all  Social Connections: Somewhat Isolated  (04/28/2019)   Social Connection and Isolation Panel [NHANES]    Frequency of Communication with Friends and Family: More than three times a week    Frequency of Social Gatherings with Friends and Family: Once a week    Attends Religious Services: 1 to 4 times per year    Active Member of [employer] or Organizations: No    Attends Archivist Meetings: Never    Marital Status: Never married  Intimate Partner Violence: Not At Risk (04/28/2019)   Humiliation, Afraid, Rape, and Kick questionnaire    Fear of Current or Ex-Partner: No    Emotionally Abused: No    Physically Abused: No    Sexually Abused: No     PHYSICAL EXAM  Vitals:   02/28/23 1445  BP: 122/83  Pulse: 94  Weight: 228 lb 9.6 oz (103.7 kg)  Height: 5\' 3"  (1.6 m)    Body mass index is 40.49 kg/m.   General: The patient is well-developed and well-nourished and in no acute distress   Neurologic Exam  Mental status: The patient is alert and oriented x 3 at the time of the examination. The patient has apparent normal recent and remote memory, with an apparently normal attention span and concentration ability.   Speech is normal.  Cranial nerves:   Extraocular muscles are intact.  Pupils react normally.,  Color vision is normal.  Facial strength and sensation normal. Trapezius strength is strong..    Motor:  Muscle bulk is normal.   Muscle tone is normal.  Strength is 5/5 in the arms and legs.  Normal RAM in hands and legs.   Sensory:   She has normal touch and vibration sensation in the arms and legs.  Coordination: Cerebellar testing reveals good finger-nose-finger bilaterally.  The heel-to-shin is performed well.  Gait and station: Station is normal.  Gait and tandem gait are normal.   Reflexes: Deep tendon reflexes are normal and symmetric.   DIAGNOSTIC DATA (LABS, IMAGING, TESTING) - I reviewed patient records,  labs, notes, testing and imaging myself where available.  Lab Results  Component Value Date    WBC 7.9 02/18/2023   HGB 12.6 02/18/2023   HCT 37.2 02/18/2023   MCV 90 02/18/2023   PLT 168 12/26/2022      Component Value Date/Time   NA 140 12/26/2022 1017   K 3.9 12/26/2022 1017   CL 105 12/26/2022 1017   CO2 23 12/26/2022 1017   GLUCOSE 87 12/26/2022 1017   GLUCOSE 84 05/30/2020 1512   BUN 8 12/26/2022 1017   CREATININE 0.66 12/26/2022 1017   CREATININE 0.72 05/30/2020 1512   CALCIUM 9.0 12/26/2022 1017   PROT 6.5 12/26/2022 1017   ALBUMIN 4.4 12/26/2022 1017   AST 14 12/26/2022 1017   ALT 11 12/26/2022 1017   ALKPHOS 51 12/26/2022 1017   BILITOT 0.3 12/26/2022 1017   GFRNONAA 118 08/09/2020 1446   GFRNONAA 114 05/30/2020 1512   GFRAA 136 08/09/2020 1446   GFRAA 132 05/30/2020 1512   No results found.    ASSESSMENT AND PLAN  MS (multiple sclerosis) ([place])  High risk medication use  Urinary dysfunction  Depression with anxiety   1.  Continue Tysabri.  She is stable and no new lesions on MRI.   Check a JCV antibody and CBC with differential q 6 months.    2.  Will check MRI brain around time of next  3.  Stay active and exercise as tolerated.  4.   Continue vitamin D supplements.       5.   SShe will return to see me in 6 months for regular visits and call sooner if she has new or worsening symptoms.    Kyara Boxer A. Felecia Shelling, MD, PhD [id number], [id number] PM Certified in Neurology, Clinical Neurophysiology, Sleep Medicine, Pain Medicine and Neuroimaging  [facility] [address] [place], [place] [address] [phone]

## 2023-04-02 ENCOUNTER — Ambulatory Visit (INDEPENDENT_AMBULATORY_CARE_PROVIDER_SITE_OTHER): Payer: Commercial Managed Care - PPO | Admitting: Family Medicine

## 2023-04-02 ENCOUNTER — Encounter: Payer: Self-pay | Admitting: Family Medicine

## 2023-04-02 ENCOUNTER — Other Ambulatory Visit (HOSPITAL_COMMUNITY): Payer: Self-pay

## 2023-04-02 VITALS — BP 110/76 | Temp 98.1°F | Resp 16 | Ht 63.0 in | Wt 221.9 lb

## 2023-04-02 DIAGNOSIS — N390 Urinary tract infection, site not specified: Secondary | ICD-10-CM | POA: Diagnosis not present

## 2023-04-02 DIAGNOSIS — D84821 Immunodeficiency due to drugs: Secondary | ICD-10-CM

## 2023-04-02 DIAGNOSIS — G35 Multiple sclerosis: Secondary | ICD-10-CM | POA: Diagnosis not present

## 2023-04-02 DIAGNOSIS — R350 Frequency of micturition: Secondary | ICD-10-CM

## 2023-04-02 DIAGNOSIS — Z79899 Other long term (current) drug therapy: Secondary | ICD-10-CM

## 2023-04-02 LAB — POCT URINALYSIS DIPSTICK
Bilirubin, UA: NEGATIVE
Glucose, UA: NEGATIVE
Ketones, UA: POSITIVE
Nitrite, UA: NEGATIVE
Protein, UA: NEGATIVE
Spec Grav, UA: 1.01 (ref 1.010–1.025)
Urobilinogen, UA: 0.2 E.U./dL
pH, UA: 5 (ref 5.0–8.0)

## 2023-04-02 MED ORDER — CEPHALEXIN 500 MG PO CAPS: 500.0000 mg | ORAL_CAPSULE | Freq: Two times a day (BID) | ORAL | 0 refills | Status: AC

## 2023-04-02 MED ORDER — CEPHALEXIN 500 MG PO CAPS
500.0000 mg | ORAL_CAPSULE | Freq: Two times a day (BID) | ORAL | 0 refills | Status: DC
  Filled 2023-04-02: qty 10, 5d supply, fill #0

## 2023-04-02 NOTE — Progress Notes (Unsigned)
Patient ID: Tracey Lindsey, female    DOB: 1991-03-29, 32 y.o.   MRN: [id number]  PCP: Danelle Berry, PA-C  Chief Complaint  Patient presents with   Urinary Frequency    Has some odor and vaginal pressure since this weekend    Subjective:   Tracey Lindsey is a 32 y.o. female, presents to clinic with CC of the following:  HPI  Pt presents with some urinary sx Urinary frequency, urine odor, with suprapubic pressure and dysuria and cramping  Hx of recurrent UTI Recent UC visit for UTI tx with macrobid in feb  Current sx are consistent with past UTIs No severe abd pain, N, V, flank pain, vaginal sx/discharge  Patient Active Problem List   Diagnosis Date Noted   Dysesthesia affecting both sides of body 02/07/2021   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   Nocturia 08/05/2019   Insulin resistance 02/18/2019   Screen for STD (sexually transmitted disease) 03/01/2017   High risk medication use 01/24/2017   Other headache syndrome 07/24/2016   Obesity 11/19/2015   Verruca vulgaris 06/08/2015   Multiple sclerosis (HCC) 04/04/2015   Numbness 04/04/2015   Vitamin D deficiency 04/04/2015   MS (multiple sclerosis) (HCC) 03/04/2015      Current Outpatient Medications:    Ascorbic Acid (VITAMIN C PO), Take by mouth., Disp: , Rfl:    busPIRone (BUSPAR) 10 MG tablet, Take 1 tablet (10 mg total) by mouth 2 (two) times daily., Disp: 180 tablet, Rfl: 3   Cholecalciferol (VITAMIN D3) LIQD, 2,000 Units by Does not apply route., Disp: , Rfl:    Cyanocobalamin (B-12 IJ), Inject as directed., Disp: , Rfl:    D-Mannose 500 MG CAPS, , Disp: , Rfl:    LORazepam (ATIVAN) 0.5 MG tablet, Take 1 tablet (0.5 mg total) by mouth 2 (two) times daily as needed for anxiety., Disp: 15 tablet, Rfl: 1   natalizumab (TYSABRI) 300 MG/15ML injection, Inject 15 mLs into the vein every 30 (thirty) days. , Disp: , Rfl:  No current facility-administered medications for this visit.  Facility-Administered Medications Ordered  in Other Visits:    gadopentetate dimeglumine (MAGNEVIST) injection 18 mL, 18 mL, Intravenous, Once PRN, Sater, Pearletha Furl, MD   No Known Allergies   Social History   Tobacco Use   Smoking status: Never   Smokeless tobacco: Never  Vaping Use   Vaping Use: Never used  Substance Use Topics   Alcohol use: Not Currently    Comment: occasionally   Drug use: Never      Chart Review Today: I personally reviewed active problem list, medication list, allergies, family history, social history, health maintenance, notes from last encounter, lab results, imaging with the patient/caregiver today.   Review of Systems  Constitutional: Negative.   HENT: Negative.    Eyes: Negative.   Respiratory: Negative.    Cardiovascular: Negative.   Gastrointestinal: Negative.   Endocrine: Negative.   Genitourinary: Negative.   Musculoskeletal: Negative.   Skin: Negative.   Allergic/Immunologic: Negative.   Neurological: Negative.   Hematological: Negative.   Psychiatric/Behavioral: Negative.    All other systems reviewed and are negative.      Objective:   Vitals:   04/02/23 1331  BP: 110/76  Resp: 16  Temp: 98.1 F (36.7 C)  TempSrc: Oral  SpO2: 99%  Weight: 221 lb 14.4 oz (100.7 kg)  Height: 5\' 3"  (1.6 m)    Body mass index is 39.31 kg/m.  Physical Exam Vitals and nursing note reviewed.  Constitutional:      General: She is not in acute distress.    Appearance: Normal appearance. She is well-developed. She is obese. She is not ill-appearing, toxic-appearing or diaphoretic.  HENT:     Head: Normocephalic and atraumatic.     Right Ear: External ear normal.     Left Ear: External ear normal.     Nose: Nose normal.  Eyes:     General:        Right eye: No discharge.        Left eye: No discharge.     Conjunctiva/sclera: Conjunctivae normal.  Neck:     Trachea: No tracheal deviation.  Cardiovascular:     Rate and Rhythm: Normal rate and regular rhythm.     Pulses:  Normal pulses.     Heart sounds: Normal heart sounds. No murmur heard.    No friction rub. No gallop.  Pulmonary:     Effort: Pulmonary effort is normal. No respiratory distress.     Breath sounds: Normal breath sounds. No stridor.  Abdominal:     General: Bowel sounds are normal. There is no distension.     Palpations: Abdomen is soft.     Tenderness: There is no abdominal tenderness. There is no right CVA tenderness, left CVA tenderness, guarding or rebound.  Skin:    General: Skin is warm and dry.     Findings: No rash.  Neurological:     Mental Status: She is alert.     Motor: No abnormal muscle tone.     Coordination: Coordination normal.  Psychiatric:        Mood and Affect: Mood normal.        Behavior: Behavior normal.        Results for orders placed or performed in visit on 04/02/23  POCT urinalysis dipstick  Result Value Ref Range   Color, UA Yellow    Clarity, UA Clear    Glucose, UA Negative Negative   Bilirubin, UA Negative    Ketones, UA Positive    Spec Grav, UA 1.010 1.010 - 1.025   Blood, UA Trace    pH, UA 5.0 5.0 - 8.0   Protein, UA Negative Negative   Urobilinogen, UA 0.2 0.2 or 1.0 E.U./dL   Nitrite, UA Negative    Leukocytes, UA Moderate (2+) (A) Negative   Appearance Yellow    Odor Foul        Assessment & Plan:     ICD-10-CM   1. Urinary frequency  R35.0 POCT urinalysis dipstick    Urine Microscopic    Urine Culture    2. Recurrent UTI  N39.0 Urine Microscopic    Urine Culture    3. 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 MS (multiple sclerosis) (HCC)  G35      Pt with sx consistent with UTI and her prior UTIs, hx of recurrent UTI and MS (possible neurogenic bladder vs OAB?) Previously consulted with urology, she doesn't feel that UTIs have increased in frequency PT will let me know if she wants to restart flomax or myrbetriq or f/up with urology, flomax helped  her more than myrbetriq, but urology said flomax shouldn't be needed and the other med was tried instead.  She is not currently on meds after they were ineffective and expensive.  Pt encouraged to watch for secondary yeast infection  We will follow culture Clean catch 2nd sample obtained  today due to prior micro's showing multiple GU flora   follow up as needed   Danelle Berry, PA-C 04/02/23 1:41 PM

## 2023-04-03 LAB — URINALYSIS, MICROSCOPIC ONLY
Bacteria, UA: NONE SEEN /HPF
Hyaline Cast: NONE SEEN /LPF
RBC / HPF: NONE SEEN /HPF (ref 0–2)
Squamous Epithelial / HPF: NONE SEEN /HPF (ref <=5)
WBC, UA: NONE SEEN /HPF (ref 0–5)

## 2023-04-03 LAB — URINE CULTURE
MICRO NUMBER:: 14893604
SPECIMEN QUALITY:: ADEQUATE

## 2023-04-04 ENCOUNTER — Encounter: Payer: Self-pay | Admitting: Family Medicine

## 2023-04-15 DIAGNOSIS — G35 Multiple sclerosis: Secondary | ICD-10-CM | POA: Diagnosis not present

## 2023-04-18 ENCOUNTER — Other Ambulatory Visit (HOSPITAL_COMMUNITY): Payer: Self-pay

## 2023-05-13 DIAGNOSIS — G35 Multiple sclerosis: Secondary | ICD-10-CM | POA: Diagnosis not present

## 2023-05-16 IMAGING — MR MR ABDOMEN WO/W CM
16 series · 48 of 48 positions shown · IV contrast (20 mL Multihance)
Comparison: Renal ultrasound 05/01/2022

CLINICAL DATA: Renal mass evaluation

EXAM:
MRI ABDOMEN WITHOUT AND WITH CONTRAST
TECHNIQUE: Multiplanar multisequence MR imaging of the abdomen was performed
both before and after the administration of intravenous contrast.
CONTRAST:  20mL MULTIHANCE GADOBENATE DIMEGLUMINE 529 MG/ML IV SOLN

[Series 2: T2 · coronal · 5.0mm · 1.56mm/px · 1 of 36 slices shown (1 of 3)]
[im 1/36]
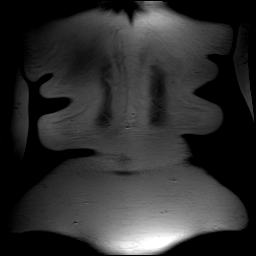

[Series 3: T1 · axial · 6.0mm · 0.74mm/px · z∈[-115,+130]mm · 2 of 70 slices shown]
[im 1/70]
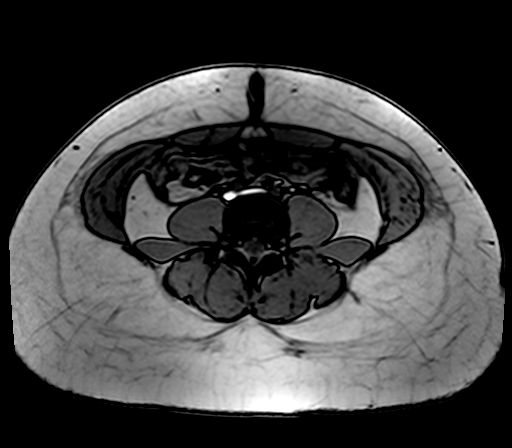
[im 70/70]
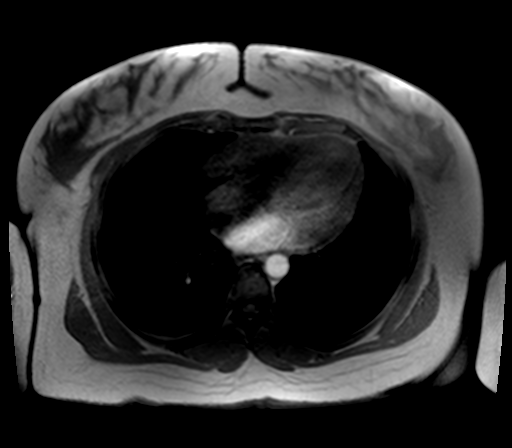

[Series 4: T2 · axial · 6.0mm · 1.22mm/px · 1 of 35 slices shown (2 of 3)]
[im 1/35]
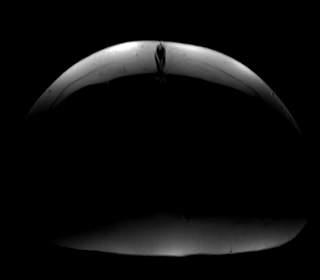

[Series 5: T2 · axial · 6.0mm · 1.48mm/px · 1 of 38 slices shown (3 of 3)]
[im 1/38]
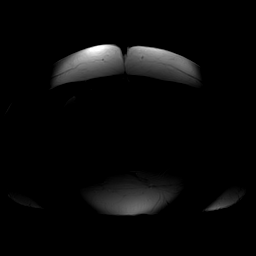

[Series 6: DWI · axial · 6.0mm · 1.42mm/px · z∈[-128,+138]mm · 4 of 114 slices shown (1 of 2)]
[im 1/114]
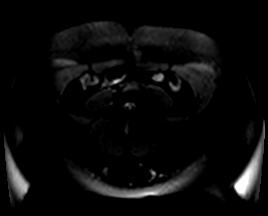
[im 38/114]
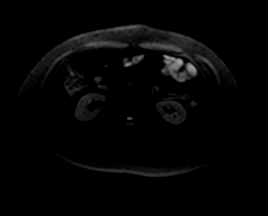
[im 76/114]
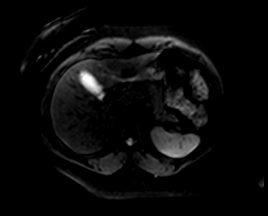
[im 114/114]
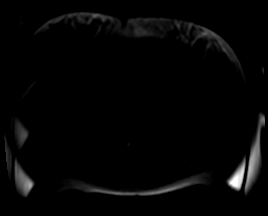

[Series 7: DWI · axial · 6.0mm · 1.42mm/px · 1 of 38 slices shown (2 of 2)]
[im 1/38]
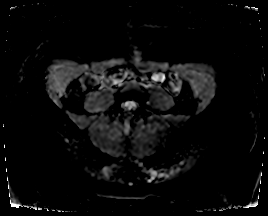

[Series 9: T1 dynamic · axial · non-contrast · 3.0mm · 1.25mm/px · z∈[-120,+141]mm · 3 of 88 slices shown]
[im 1/88]
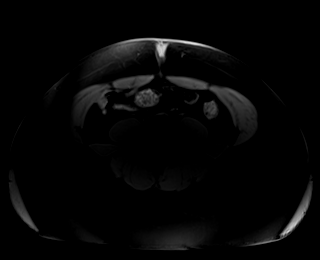
[im 44/88]
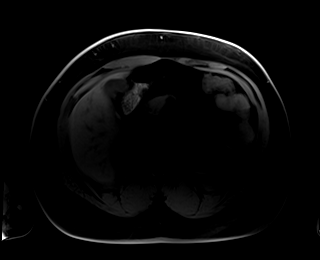
[im 88/88]
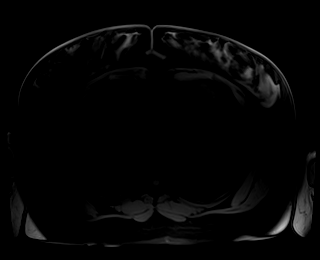

[Series 10: T1 dynamic post-contrast · axial · 3.0mm · 1.25mm/px · z∈[-120,+141]mm · 4 of 88 slices shown (1 of 9)]
[im 1/88]
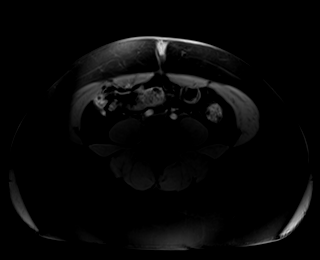
[im 30/88]
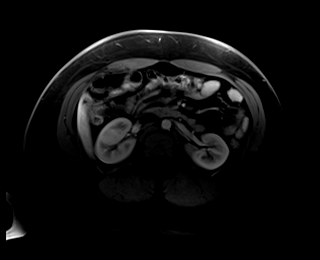
[im 59/88]
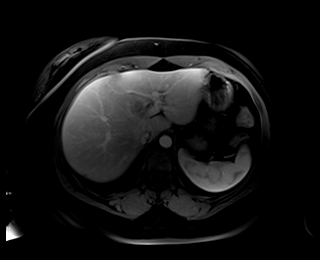
[im 88/88]
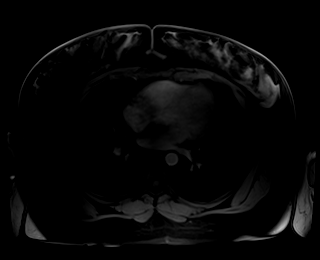

[Series 11: T1 dynamic post-contrast · axial · 3.0mm · 1.25mm/px · z∈[-120,+141]mm · 4 of 88 slices shown (2 of 9)]
[im 1/88]
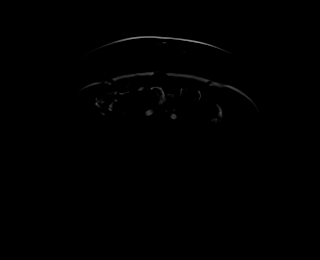
[im 30/88]
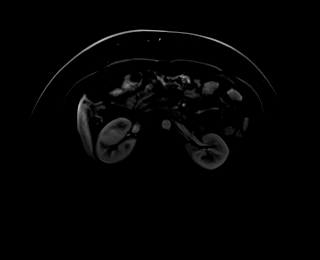
[im 59/88]
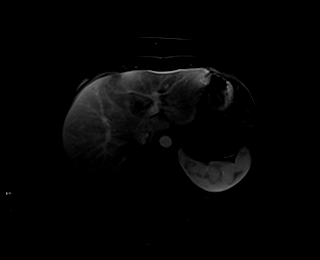
[im 88/88]
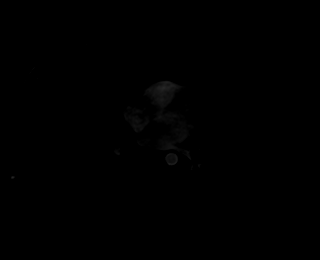

[Series 12: T1 dynamic post-contrast · axial · 3.0mm · 1.25mm/px · z∈[-120,+141]mm · 4 of 88 slices shown (3 of 9)]
[im 1/88]
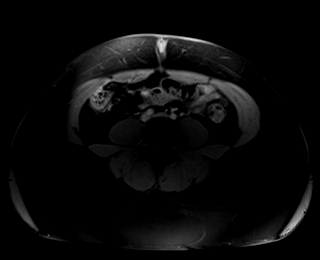
[im 30/88]
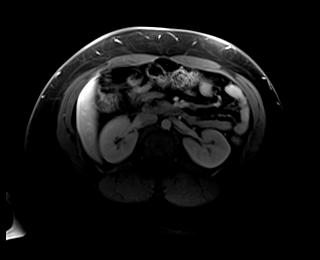
[im 59/88]
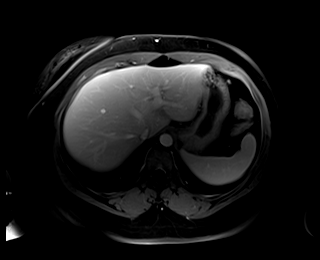
[im 88/88]
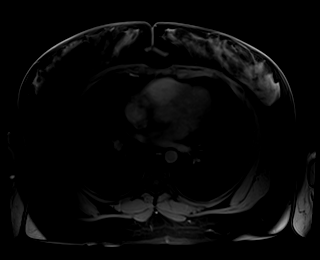

[Series 13: T1 dynamic post-contrast · axial · 3.0mm · 1.25mm/px · z∈[-120,+141]mm · 4 of 88 slices shown (4 of 9)]
[im 1/88]
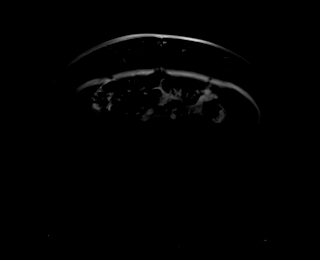
[im 30/88]
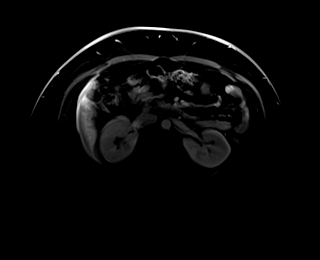
[im 59/88]
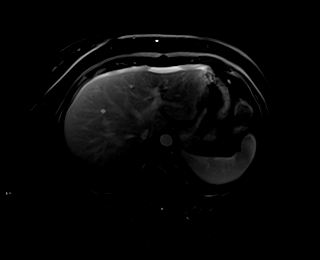
[im 88/88]
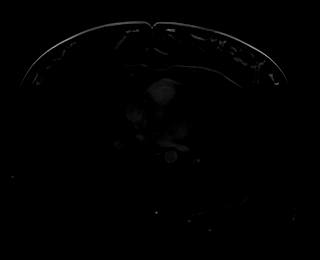

[Series 14: T1 dynamic post-contrast · axial · 3.0mm · 1.25mm/px · z∈[-120,+141]mm · 4 of 88 slices shown (5 of 9)]
[im 1/88]
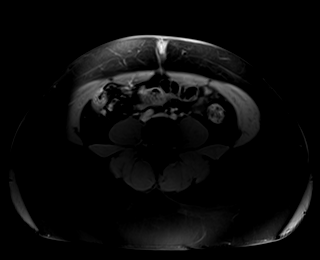
[im 30/88]
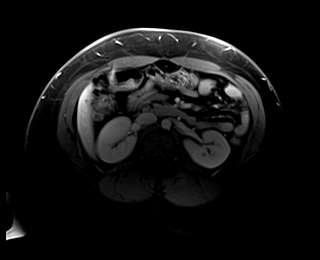
[im 59/88]
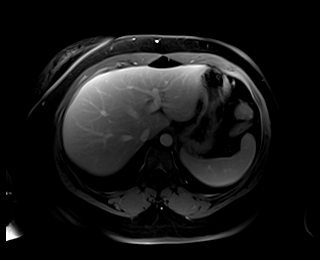
[im 88/88]
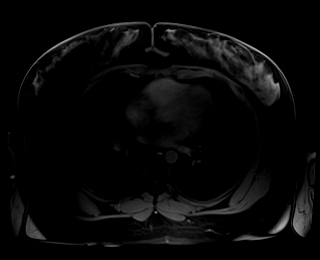

[Series 15: T1 dynamic post-contrast · axial · 3.0mm · 1.25mm/px · z∈[-120,+141]mm · 4 of 88 slices shown (6 of 9)]
[im 1/88]
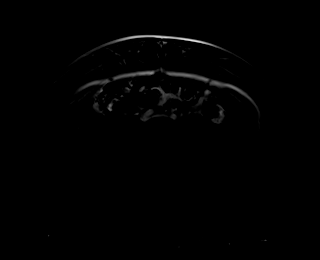
[im 30/88]
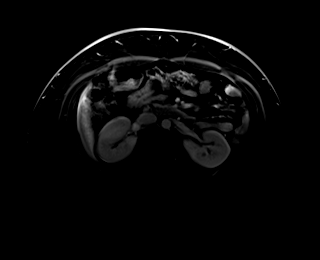
[im 59/88]
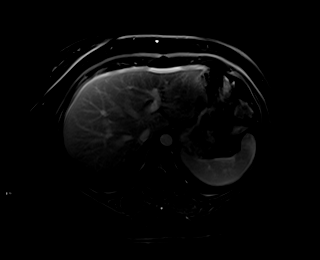
[im 88/88]
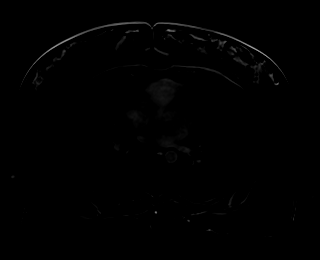

[Series 16: T1 dynamic post-contrast · coronal · 3.0mm · 1.25mm/px · 3 of 80 slices shown (7 of 9)]
[im 1/80]
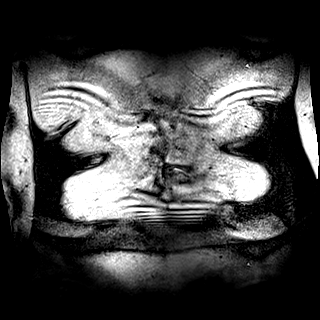
[im 40/80]
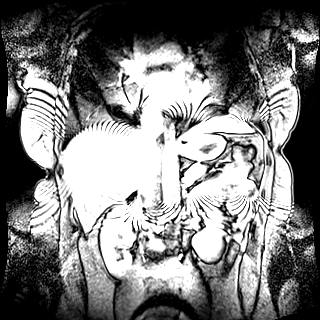
[im 80/80]
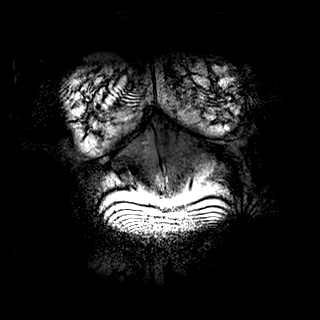

[Series 17: T1 dynamic post-contrast · axial · 3.0mm · 1.25mm/px · z∈[-120,+141]mm · 4 of 88 slices shown (8 of 9)]
[im 1/88]
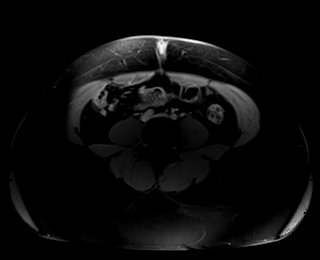
[im 30/88]
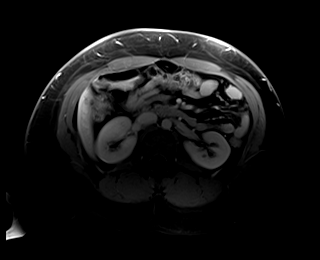
[im 59/88]
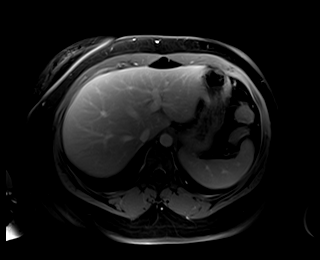
[im 88/88]
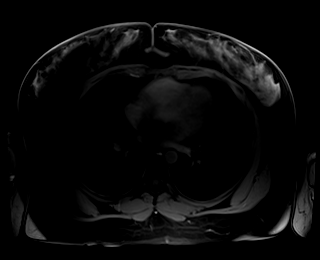

[Series 18: T1 dynamic post-contrast · axial · 3.0mm · 1.25mm/px · z∈[-120,+141]mm · 4 of 88 slices shown (9 of 9)]
[im 1/88]
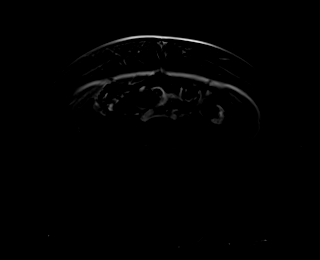
[im 30/88]
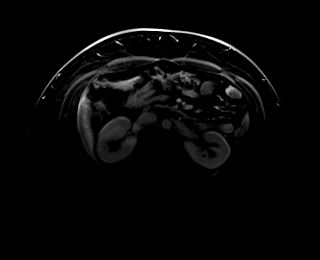
[im 59/88]
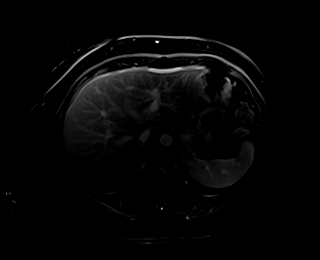
[im 88/88]
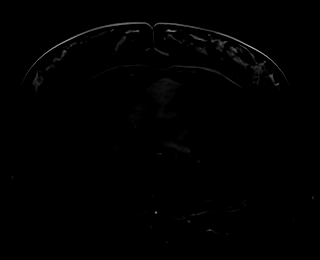

[48 of 48 positions shown; findings below may reference images not displayed]

FINDINGS: Study is somewhat limited due to motion and poor T2 fat saturation.

Lower chest: No acute findings.

Hepatobiliary: Liver is normal in size and contour with no
suspicious mass identified. Gallbladder appears normal. No biliary
ductal dilatation.

Pancreas: No mass, inflammatory changes, or other parenchymal
abnormality identified.

Spleen:  Normal size.  Small splenule inferior to the spleen.

Adrenals/Urinary Tract: Adrenal glands are normal. 5 mm cyst in the
mid left kidney. No suspicious renal mass identified bilaterally. No
hydronephrosis.

Stomach/Bowel: Visualized portions within the abdomen are
unremarkable.

Vascular/Lymphatic: No pathologically enlarged lymph nodes
identified. No abdominal aortic aneurysm demonstrated.

Other:  No ascites.

Musculoskeletal: No suspicious bony lesions identified.
IMPRESSION: No suspicious renal mass identified. 5 mm cyst in the mid left
kidney.

## 2023-06-03 DIAGNOSIS — H02884 Meibomian gland dysfunction left upper eyelid: Secondary | ICD-10-CM | POA: Diagnosis not present

## 2023-06-10 DIAGNOSIS — G35 Multiple sclerosis: Secondary | ICD-10-CM | POA: Diagnosis not present

## 2023-06-12 ENCOUNTER — Other Ambulatory Visit: Payer: Self-pay | Admitting: Oncology

## 2023-06-12 DIAGNOSIS — Z006 Encounter for examination for normal comparison and control in clinical research program: Secondary | ICD-10-CM

## 2023-06-13 ENCOUNTER — Other Ambulatory Visit (HOSPITAL_COMMUNITY)
Admission: AD | Admit: 2023-06-13 | Discharge: 2023-06-13 | Disposition: A | Discharge status: Home / Self Care | Payer: Commercial Managed Care - PPO | Source: Ambulatory Visit | Attending: Oncology | Admitting: Oncology

## 2023-06-13 DIAGNOSIS — Z006 Encounter for examination for normal comparison and control in clinical research program: Secondary | ICD-10-CM | POA: Insufficient documentation

## 2023-07-08 DIAGNOSIS — G35 Multiple sclerosis: Secondary | ICD-10-CM | POA: Diagnosis not present

## 2023-07-29 ENCOUNTER — Other Ambulatory Visit (HOSPITAL_COMMUNITY): Payer: Self-pay

## 2023-07-29 ENCOUNTER — Other Ambulatory Visit: Payer: Self-pay

## 2023-07-29 MED ORDER — ONDANSETRON 8 MG PO TBDP
8.0000 mg | ORAL_TABLET | Freq: Three times a day (TID) | ORAL | 2 refills | Status: DC
  Filled 2023-07-29: qty 60, 20d supply, fill #0

## 2023-08-06 DIAGNOSIS — G35 Multiple sclerosis: Secondary | ICD-10-CM | POA: Diagnosis not present

## 2023-09-03 ENCOUNTER — Other Ambulatory Visit: Payer: Self-pay | Admitting: *Deleted

## 2023-09-03 ENCOUNTER — Telehealth: Payer: Self-pay | Admitting: *Deleted

## 2023-09-03 ENCOUNTER — Other Ambulatory Visit: Payer: Self-pay

## 2023-09-03 DIAGNOSIS — Z79899 Other long term (current) drug therapy: Secondary | ICD-10-CM

## 2023-09-03 DIAGNOSIS — G35 Multiple sclerosis: Secondary | ICD-10-CM

## 2023-09-03 NOTE — Telephone Encounter (Signed)
Placed [facility] lab in [facility] lock box for routine lab pick up. Results pending. 

## 2023-09-04 LAB — CBC WITH DIFFERENTIAL/PLATELET
Basophils Absolute: 0.1 10*3/uL (ref 0.0–0.2)
Basos: 1 %
EOS (ABSOLUTE): 0.1 10*3/uL (ref 0.0–0.4)
Eos: 2 %
Hematocrit: 40.1 % (ref 34.0–46.6)
Hemoglobin: 13.1 g/dL (ref 11.1–15.9)
Immature Grans (Abs): 0 10*3/uL (ref 0.0–0.1)
Immature Granulocytes: 0 %
Lymphocytes Absolute: 3.3 10*3/uL — ABNORMAL HIGH (ref 0.7–3.1)
Lymphs: 41 %
MCH: 30.2 pg (ref 26.6–33.0)
MCHC: 32.7 g/dL (ref 31.5–35.7)
MCV: 92 fL (ref 79–97)
Monocytes Absolute: 0.5 10*3/uL (ref 0.1–0.9)
Monocytes: 6 %
Neutrophils Absolute: 4.1 10*3/uL (ref 1.4–7.0)
Neutrophils: 50 %
Platelets: 176 10*3/uL (ref 150–450)
RBC: 4.34 x10E6/uL (ref 3.77–5.28)
RDW: 13 % (ref 11.7–15.4)
WBC: 8.1 10*3/uL (ref 3.4–10.8)

## 2023-09-06 LAB — STRATIFY JCV AB (W/ INDEX) W/ RFLX
Index Value: 0.15
Stratify JCV (TM) Ab w/Reflex Inhibition: NEGATIVE

## 2023-09-10 ENCOUNTER — Ambulatory Visit (INDEPENDENT_AMBULATORY_CARE_PROVIDER_SITE_OTHER): Payer: Commercial Managed Care - PPO | Admitting: Neurology

## 2023-09-10 ENCOUNTER — Encounter: Payer: Self-pay | Admitting: Neurology

## 2023-09-10 VITALS — BP 121/82 | HR 88 | Ht 62.0 in | Wt 212.0 lb

## 2023-09-10 DIAGNOSIS — E559 Vitamin D deficiency, unspecified: Secondary | ICD-10-CM

## 2023-09-10 DIAGNOSIS — R39198 Other difficulties with micturition: Secondary | ICD-10-CM

## 2023-09-10 DIAGNOSIS — Z79899 Other long term (current) drug therapy: Secondary | ICD-10-CM

## 2023-09-10 DIAGNOSIS — G35 Multiple sclerosis: Secondary | ICD-10-CM | POA: Diagnosis not present

## 2023-09-10 DIAGNOSIS — G35D Multiple sclerosis, unspecified: Secondary | ICD-10-CM

## 2023-09-10 DIAGNOSIS — R200 Anesthesia of skin: Secondary | ICD-10-CM

## 2023-09-10 DIAGNOSIS — M7552 Bursitis of left shoulder: Secondary | ICD-10-CM | POA: Diagnosis not present

## 2023-09-10 NOTE — Progress Notes (Signed)
[facility]  PATIENT: Tracey Lindsey DOB: [birth year]  REFERRING DOCTOR OR PCP:  Luan Pulling Jaci Lazier) SOURCE: Patient and ER records and PACS images  _________________________________   HISTORICAL  CHIEF COMPLAINT:  Chief Complaint  Patient presents with   Follow-up    Pt in room 11. Here for MS follow up. On Tysabri. Pt said left arm weakness ongoing, has noticed frequent headaches, typically doesn't need medication, takes Advil if needed, has taken B12 injections for 7 weeks now.    HISTORY OF PRESENT ILLNESS:  Tracey Lindsey is a 32 y.o. woman recently diagnosed with MS.     Update 09/10/2023 She is on Tysabri,   she tolerates it well.  Her MS has been stable with no recent exacerbations or new neurologic symptoms.     She is JCV Ab negative 02/18/2023  She has had left arm pain with tingling.  This bothers her more at night but is present during the day.  Sleeping on her left side increases the pain.   Of note, she had an exacerbation with left sided numbness April 2016.   However, she completely improved and had no left sided numbness until more recently.   She denies neck pain but is uncomfortable if she lays on left side.   She denies any change in bladder (same issue with frequency).     She denies any major problems with her gait and can walk a couple miles.   She uses the bannister on stairs for safety but can go down without it..    She denies weakness.    She has urinary frequency and  3x nocturia .  She has no incontinence.  She denies hesitancy.    Two UTI since the last visit.   Vision is ok but she notes some blurriness in the distance.    This is symmetric.   Colors are symmetric.      She has fatigue daily but accomplishes most of what she needs to.   She works 2 jobs.. She sleeps ok but occasioanlly has trouble falling back asleep when she wakes up in middle of the night.   She has more stress recently.    She denies depression but has  mid anxiety helped by Buspar.    Cognition is doing well.      MS History:    In retrospect, she had an episode of vertigo in 2014 with mild vision changes that improved.    In March 2016, she had vertigo that improved after a couple weeks.  In late April, she had  numbness in the left foot. When she woke up the next morning, she noted that the numbness was higher up the left arm and the next day it also involved the left hand and she went to the emergency room. In the emergency room, she had an MRI of the brain that showed white matter foci were some for multiple sclerosis.     IMAGING MRI of the brain with and without contrast 03/29/15 showed several posterior fossa lesions with one in the right middle cerebellar peduncle and several in the pons.   The middle cerebellar peduncle focus and 2 of the pontine foci enhanced. There are also several periventricular and deep white matter foci in the cerebral hemispheres.   MRI cervical spine shows one enhancing focus.  Repeat imaging of the brain and cervical spine November 2017 did not show any new lesions. The previously enhancing lesions were smaller and did  not enhance.Marland Kitchen     MRI of the brain 02/17/2020 showed Scattered T2/FLAIR hyperintense foci in the hemispheres.  Though nonspecific, the foci are consistent with the diagnosis of multiple sclerosis.  None of the foci appear to be acute and there are no new lesions compared to the 2019 MRI.   There is a partially empty sella turcica, unchanged in appearance since 2019.  This could be incidental or be associated with elevated intracranial pressure.  MRi of the cervical spiine 02/17/2020 showed T2 hyperintense focus within the spinal cord to the left adjacent to C3-C4.  This was also present on the 2017 MRI and is consistent with a chronic demyelinating plaque associated with multiple sclerosis.  There are no degenerative changes noted  MRI of the brain and cervical spine 08/29/2021 unchanged compared to  02/17/2020  REVIEW OF SYSTEMS: Constitutional: No fevers, chills, sweats, or change in appetite.   She notes some fatigue. Eyes: No visual changes, double vision, eye pain Ear, nose and throat: No hearing loss, ear pain, nasal congestion, sore throat Cardiovascular: No chest pain, palpitations Respiratory:  No shortness of breath at rest or with exertion.   No wheezes GastrointestinaI: No nausea, vomiting, diarrhea, abdominal pain, fecal incontinence Genitourinary:  Notes frequency, nocturia (x2).   She has PCOS Musculoskeletal:  No neck pain, back pain Integumentary: No rash, pruritus, skin lesions Neurological: as above Psychiatric: as above  Endocrine: No palpitations, diaphoresis, change in appetite, change in weigh or increased thirst Hematologic/Lymphatic:  No anemia, purpura, petechiae. Allergic/Immunologic: No itchy/runny eyes, nasal congestion, recent allergic reactions, rashes  ALLERGIES: No Known Allergies  HOME MEDICATIONS:  Current Outpatient Medications:    Ascorbic Acid (VITAMIN C PO), Take by mouth., Disp: , Rfl:    busPIRone (BUSPAR) 10 MG tablet, Take 1 tablet (10 mg total) by mouth 2 (two) times daily., Disp: 180 tablet, Rfl: 3   Cholecalciferol (VITAMIN D3) 50 MCG (2000 UT) CAPS, Take 2,000 Units by mouth daily., Disp: , Rfl:    Cyanocobalamin (B-12 IJ), Inject as directed., Disp: , Rfl:    D-Mannose 500 MG CAPS, , Disp: , Rfl:    natalizumab (TYSABRI) 300 MG/15ML injection, Inject 15 mLs into the vein every 30 (thirty) days. , Disp: , Rfl:    Cholecalciferol (VITAMIN D3) LIQD, 2,000 Units by Does not apply route. (Patient not taking: Reported on 09/10/2023), Disp: , Rfl:    LORazepam (ATIVAN) 0.5 MG tablet, Take 1 tablet (0.5 mg total) by mouth 2 (two) times daily as needed for anxiety. (Patient not taking: Reported on 09/10/2023), Disp: 15 tablet, Rfl: 1   ondansetron (ZOFRAN-ODT) 8 MG disintegrating tablet, Take 1 tablet (8 mg total) by mouth every 8 (eight) hours  for nausea and vomiting. (Patient not taking: Reported on 09/10/2023), Disp: 60 tablet, Rfl: 2 No current facility-administered medications for this visit.  Facility-Administered Medications Ordered in Other Visits:    gadopentetate dimeglumine (MAGNEVIST) injection 18 mL, 18 mL, Intravenous, Once PRN, Lashica Hannay, Pearletha Furl, MD  PAST MEDICAL HISTORY: Past Medical History:  Diagnosis Date   Anxiety    Chlamydia    Depression    History of abnormal cervical Papanicolaou smear    MS (multiple sclerosis) (HCC) April 2016   PCOS (polycystic ovarian syndrome)    Vitamin D deficiency     PAST SURGICAL HISTORY: Past Surgical History:  Procedure Laterality Date   NO PAST SURGERIES      FAMILY HISTORY: Family History  Problem Relation Age of Onset   Diabetes Father  Hypertension Father    Stroke Father    Cancer Mother        lung   Hyperlipidemia Mother    Lung disease Mother    Eczema Brother    Colon cancer Maternal Aunt    Other Maternal Grandmother    Cancer Maternal Grandmother    Breast cancer Neg Hx    Ovarian cancer Neg Hx     SOCIAL HISTORY:  Social History   Socioeconomic History   Marital status: Single    Spouse name: Not on file   Number of children: Not on file   Years of education: 18   Highest education level: Master's degree (e.g., MA, MS, MEng, MEd, MSW, MBA)  Occupational History   Occupation: Visual merchandiser  Tobacco Use   Smoking status: Never   Smokeless tobacco: Never  Vaping Use   Vaping status: Never Used  Substance and Sexual Activity   Alcohol use: Not Currently    Comment: occasionally   Drug use: Never   Sexual activity: Yes    Partners: Male    Birth control/protection: None  Other Topics Concern   Not on file  Social History Narrative   Lives with mother    Drinks soda daily (8-10 ounces)   Social Determinants of Health   Financial Resource Strain: Low Risk  (04/28/2019)   Overall Financial Resource Strain (CARDIA)     Difficulty of Paying Living Expenses: Not hard at all  Food Insecurity: No Food Insecurity (04/28/2019)   Hunger Vital Sign    Worried About Running Out of Food in the Last Year: Never true    Ran Out of Food in the Last Year: Never true  Transportation Needs: No Transportation Needs (04/28/2019)   PRAPARE - Administrator, Civil Service (Medical): No    Lack of Transportation (Non-Medical): No  Physical Activity: Inactive (04/28/2019)   Exercise Vital Sign    Days of Exercise per Week: 0 days    Minutes of Exercise per Session: 0 min  Stress: No Stress Concern Present (04/28/2019)   [employer] of Occupational Health - Occupational Stress Questionnaire    Feeling of Stress : Not at all  Social Connections: Somewhat Isolated (04/28/2019)   Social Connection and Isolation Panel [NHANES]    Frequency of Communication with Friends and Family: More than three times a week    Frequency of Social Gatherings with Friends and Family: Once a week    Attends Religious Services: 1 to 4 times per year    Active Member of [employer] or Organizations: No    Attends Banker Meetings: Never    Marital Status: Never married  Intimate Partner Violence: Not At Risk (04/28/2019)   Humiliation, Afraid, Rape, and Kick questionnaire    Fear of Current or Ex-Partner: No    Emotionally Abused: No    Physically Abused: No    Sexually Abused: No     PHYSICAL EXAM  Vitals:   09/10/23 1431  BP: 121/82  Pulse: 88  Weight: 212 lb (96.2 kg)  Height: 5\' 2"  (1.575 m)    Body mass index is 38.78 kg/m.   General: The patient is well-developed and well-nourished and in no acute distress  Tender to palpation over left subacromial bursa, left deltoid.     Neurologic Exam  Mental status: The patient is alert and oriented x 3 at the time of the examination. The patient has apparent normal recent and remote memory, with an apparently  normal attention span and concentration ability.    Speech is normal.  Cranial nerves:   Extraocular muscles are intact.  Pupils react normally.,  Color vision is normal.  Facial strength and sensation normal. Trapezius strength is strong..    Motor:  Muscle bulk is normal.   Muscle tone is normal.  Strength is 5/5 in the arms and legs.  Normal RAM in hands and legs.   Sensory:   She has normal touch and vibration sensation in the arms and legs except mild reduced touch on left hand vs right.    Coordination: Cerebellar testing reveals good finger-nose-finger bilaterally.  The heel-to-shin is performed well.  Gait and station: Station is normal.  Gait and tandem gait are normal.  Romberg is negative  Reflexes: Deep tendon reflexes are normal and symmetric.     DIAGNOSTIC DATA (LABS, IMAGING, TESTING) - I reviewed patient records, labs, notes, testing and imaging myself where available.  Lab Results  Component Value Date   WBC 8.1 09/03/2023   HGB 13.1 09/03/2023   HCT 40.1 09/03/2023   MCV 92 09/03/2023   PLT 176 09/03/2023      Component Value Date/Time   NA 140 12/26/2022 1017   K 3.9 12/26/2022 1017   CL 105 12/26/2022 1017   CO2 23 12/26/2022 1017   GLUCOSE 87 12/26/2022 1017   GLUCOSE 84 05/30/2020 1512   BUN 8 12/26/2022 1017   CREATININE 0.66 12/26/2022 1017   CREATININE 0.72 05/30/2020 1512   CALCIUM 9.0 12/26/2022 1017   PROT 6.5 12/26/2022 1017   ALBUMIN 4.4 12/26/2022 1017   AST 14 12/26/2022 1017   ALT 11 12/26/2022 1017   ALKPHOS 51 12/26/2022 1017   BILITOT 0.3 12/26/2022 1017   GFRNONAA 118 08/09/2020 1446   GFRNONAA 114 05/30/2020 1512   GFRAA 136 08/09/2020 1446   GFRAA 132 05/30/2020 1512   No results found.    ASSESSMENT AND PLAN  MS (multiple sclerosis) (HCC)  Arm numbness left  Subacromial bursitis of left shoulder joint  High risk medication use  Vitamin D deficiency  Urinary dysfunction   1.  Continue Tysabri.  Check MRI brain and cervical spine to determine if breakthrough  activity.  If so consider a different DMT.    JCV is pending was negative March 2024 2.  Inject left subacromial bursa with 40 mg in 3 cc Marcaine using sterile technique.  She tolerated the injecition well and there was no complication 3. Stay active and exercise as tolerated.  4.   Continue vitamin D supplements.       5.  She will return to see me in 6 months for regular visits and call sooner if she has new or worsening symptoms.    Ivianna Notch A. Epimenio Foot, MD, PhD 09/10/2023, 3:19 PM Certified in Neurology, Clinical Neurophysiology, Sleep Medicine, Pain Medicine and Neuroimaging  [facility] [address] [place], [place] [address] [phone]

## 2023-09-17 ENCOUNTER — Ambulatory Visit (INDEPENDENT_AMBULATORY_CARE_PROVIDER_SITE_OTHER): Payer: Commercial Managed Care - PPO

## 2023-09-17 DIAGNOSIS — R39198 Other difficulties with micturition: Secondary | ICD-10-CM | POA: Diagnosis not present

## 2023-09-17 DIAGNOSIS — R200 Anesthesia of skin: Secondary | ICD-10-CM

## 2023-09-17 DIAGNOSIS — G35 Multiple sclerosis: Secondary | ICD-10-CM

## 2023-09-17 MED ORDER — GADOBENATE DIMEGLUMINE 529 MG/ML IV SOLN
20.0000 mL | Freq: Once | INTRAVENOUS | Status: AC | PRN
  Administered 2023-09-17: 20 mL via INTRAVENOUS

## 2023-10-01 DIAGNOSIS — G35 Multiple sclerosis: Secondary | ICD-10-CM | POA: Diagnosis not present

## 2023-10-22 ENCOUNTER — Telehealth: Payer: Self-pay

## 2023-10-22 NOTE — Telephone Encounter (Signed)
Faxed Tysabri form to [employer] [phone] on 10/22/2023

## 2023-10-29 DIAGNOSIS — G35 Multiple sclerosis: Secondary | ICD-10-CM | POA: Diagnosis not present

## 2023-11-07 ENCOUNTER — Other Ambulatory Visit: Payer: Self-pay | Admitting: *Deleted

## 2023-11-07 ENCOUNTER — Other Ambulatory Visit: Payer: Self-pay

## 2023-11-07 ENCOUNTER — Other Ambulatory Visit (HOSPITAL_COMMUNITY): Payer: Self-pay

## 2023-11-07 MED ORDER — BUSPIRONE HCL 10 MG PO TABS
10.0000 mg | ORAL_TABLET | Freq: Two times a day (BID) | ORAL | 1 refills | Status: DC
  Filled 2023-11-07: qty 180, 90d supply, fill #0
  Filled 2024-02-03: qty 180, 90d supply, fill #1

## 2023-11-07 NOTE — Telephone Encounter (Signed)
Last seen on 09/10/23 Follow up scheduled on 04/02/24

## 2023-11-21 DIAGNOSIS — F411 Generalized anxiety disorder: Secondary | ICD-10-CM | POA: Diagnosis not present

## 2023-11-22 DIAGNOSIS — N941 Unspecified dyspareunia: Secondary | ICD-10-CM | POA: Diagnosis not present

## 2023-11-22 DIAGNOSIS — Z8489 Family history of other specified conditions: Secondary | ICD-10-CM | POA: Diagnosis not present

## 2023-11-22 DIAGNOSIS — Z8742 Personal history of other diseases of the female genital tract: Secondary | ICD-10-CM | POA: Diagnosis not present

## 2023-11-26 DIAGNOSIS — G35 Multiple sclerosis: Secondary | ICD-10-CM | POA: Diagnosis not present

## 2023-12-05 DIAGNOSIS — F411 Generalized anxiety disorder: Secondary | ICD-10-CM | POA: Diagnosis not present

## 2023-12-10 DIAGNOSIS — N83209 Unspecified ovarian cyst, unspecified side: Secondary | ICD-10-CM | POA: Diagnosis not present

## 2023-12-10 DIAGNOSIS — Z8742 Personal history of other diseases of the female genital tract: Secondary | ICD-10-CM | POA: Diagnosis not present

## 2023-12-10 DIAGNOSIS — Z842 Family history of other diseases of the genitourinary system: Secondary | ICD-10-CM | POA: Diagnosis not present

## 2023-12-10 DIAGNOSIS — Z8489 Family history of other specified conditions: Secondary | ICD-10-CM | POA: Diagnosis not present

## 2023-12-10 DIAGNOSIS — N941 Unspecified dyspareunia: Secondary | ICD-10-CM | POA: Diagnosis not present

## 2023-12-10 DIAGNOSIS — D259 Leiomyoma of uterus, unspecified: Secondary | ICD-10-CM | POA: Diagnosis not present

## 2023-12-19 DIAGNOSIS — F411 Generalized anxiety disorder: Secondary | ICD-10-CM | POA: Diagnosis not present

## 2023-12-20 DIAGNOSIS — D219 Benign neoplasm of connective and other soft tissue, unspecified: Secondary | ICD-10-CM | POA: Diagnosis not present

## 2023-12-24 DIAGNOSIS — G35 Multiple sclerosis: Secondary | ICD-10-CM | POA: Diagnosis not present

## 2024-01-02 ENCOUNTER — Ambulatory Visit: Payer: Commercial Managed Care - PPO | Admitting: Family Medicine

## 2024-01-02 ENCOUNTER — Encounter: Payer: Self-pay | Admitting: Family Medicine

## 2024-01-02 VITALS — BP 118/68 | HR 108 | Temp 98.3°F | Resp 16 | Ht 62.0 in | Wt 192.0 lb

## 2024-01-02 DIAGNOSIS — J3489 Other specified disorders of nose and nasal sinuses: Secondary | ICD-10-CM | POA: Diagnosis not present

## 2024-01-02 DIAGNOSIS — R0981 Nasal congestion: Secondary | ICD-10-CM

## 2024-01-02 DIAGNOSIS — J101 Influenza due to other identified influenza virus with other respiratory manifestations: Secondary | ICD-10-CM | POA: Diagnosis not present

## 2024-01-02 DIAGNOSIS — R6889 Other general symptoms and signs: Secondary | ICD-10-CM

## 2024-01-02 LAB — POCT INFLUENZA A/B
Influenza A, POC: POSITIVE — AB
Influenza B, POC: NEGATIVE

## 2024-01-02 MED ORDER — OSELTAMIVIR PHOSPHATE 75 MG PO CAPS
75.0000 mg | ORAL_CAPSULE | Freq: Two times a day (BID) | ORAL | 0 refills | Status: AC
Start: 2024-01-02 — End: 2024-01-07

## 2024-01-02 NOTE — Progress Notes (Signed)
Patient ID: Tracey Lindsey, female    DOB: 10/22/1991, 33 y.o.   MRN: [id number]  PCP: Tracey Berry, PA-C  Chief Complaint  Patient presents with   Cough    Non-productive   Headache    Tried Advil w/little help. Keeps returning.   Sinusitis    X4 days, OTC meds not helping    Subjective:   Tracey Lindsey is a 33 y.o. female, presents to clinic with CC of the following:  HPI  Patient presents for 2 to 3 days of headache facial pain congestion body aches and nonproductive cough She is positive for influenza A Mildly tachycardic here today She endorses mostly having congestion drainage facial pain as her most severe symptoms No fever Immunocompromised    Patient Active Problem List   Diagnosis Date Noted   Dysesthesia affecting both sides of body 02/07/2021   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   Nocturia 08/05/2019   Insulin resistance 02/18/2019   High risk medication use 01/24/2017   Other headache syndrome 07/24/2016   Obesity 11/19/2015   Verruca vulgaris 06/08/2015   Multiple sclerosis (HCC) 04/04/2015   Numbness 04/04/2015   Vitamin D deficiency 04/04/2015   MS (multiple sclerosis) (HCC) 03/04/2015      Current Outpatient Medications:    Ascorbic Acid (VITAMIN C PO), Take by mouth., Disp: , Rfl:    busPIRone (BUSPAR) 10 MG tablet, Take 1 tablet (10 mg total) by mouth 2 (two) times daily., Disp: 180 tablet, Rfl: 1   Cholecalciferol (VITAMIN D3) 50 MCG (2000 UT) CAPS, Take 2,000 Units by mouth daily., Disp: , Rfl:    Cyanocobalamin (B-12 IJ), Inject as directed., Disp: , Rfl:    D-Mannose 500 MG CAPS, , Disp: , Rfl:    natalizumab (TYSABRI) 300 MG/15ML injection, Inject 15 mLs into the vein every 30 (thirty) days. , Disp: , Rfl:    LORazepam (ATIVAN) 0.5 MG tablet, Take 1 tablet (0.5 mg total) by mouth 2 (two) times daily as needed for anxiety. (Patient not taking: Reported on 01/02/2024), Disp: 15 tablet, Rfl: 1 No current facility-administered medications for this  visit.  Facility-Administered Medications Ordered in Other Visits:    gadopentetate dimeglumine (MAGNEVIST) injection 18 mL, 18 mL, Intravenous, Once PRN, Sater, Pearletha Furl, MD   No Known Allergies   Social History   Tobacco Use   Smoking status: Never   Smokeless tobacco: Never  Vaping Use   Vaping status: Never Used  Substance Use Topics   Alcohol use: Not Currently    Comment: occasionally   Drug use: Never      Chart Review Today: I personally reviewed active problem list, medication list, allergies, family history, social history, health maintenance, notes from last encounter, lab results, imaging with the patient/caregiver today.   Review of Systems  Constitutional: Negative.   HENT: Negative.    Eyes: Negative.   Respiratory: Negative.    Cardiovascular: Negative.   Gastrointestinal: Negative.   Endocrine: Negative.   Genitourinary: Negative.   Musculoskeletal: Negative.   Skin: Negative.   Allergic/Immunologic: Negative.   Neurological: Negative.   Hematological: Negative.   Psychiatric/Behavioral: Negative.    All other systems reviewed and are negative.      Objective:   Vitals:   01/02/24 0923 01/02/24 0940  BP: 118/68   Pulse: (!) 113 (!) 108  Resp: 16   Temp: 98.3 F (36.8 C)   TempSrc: Oral   SpO2: 95%   Weight: 192 lb (87.1 kg)   Height:  5\' 2"  (1.575 m)     Body mass index is 35.12 kg/m.  Physical Exam Vitals and nursing note reviewed.  Constitutional:      General: She is not in acute distress.    Appearance: She is well-developed. She is not ill-appearing, toxic-appearing or diaphoretic.  HENT:     Head: Normocephalic and atraumatic.     Right Ear: Hearing, tympanic membrane, ear canal and external ear normal.     Left Ear: Hearing, tympanic membrane, ear canal and external ear normal.     Nose: Mucosal edema, congestion and rhinorrhea present. Rhinorrhea is clear.     Right Sinus: No maxillary sinus tenderness or frontal sinus  tenderness.     Left Sinus: No maxillary sinus tenderness or frontal sinus tenderness.     Mouth/Throat:     Mouth: Mucous membranes are moist. Mucous membranes are not pale.     Pharynx: Oropharynx is clear. Uvula midline. No oropharyngeal exudate or uvula swelling.     Tonsils: No tonsillar exudate or tonsillar abscesses.  Eyes:     General:        Right eye: No discharge.        Left eye: No discharge.     Conjunctiva/sclera: Conjunctivae normal.     Pupils: Pupils are equal, round, and reactive to light.  Neck:     Trachea: No tracheal deviation.  Cardiovascular:     Rate and Rhythm: Regular rhythm. Tachycardia present.     Heart sounds: Normal heart sounds.  Pulmonary:     Effort: Pulmonary effort is normal. No tachypnea, accessory muscle usage, respiratory distress or retractions.     Breath sounds: Normal breath sounds. No stridor or transmitted upper airway sounds. No decreased breath sounds, wheezing, rhonchi or rales.  Abdominal:     General: Bowel sounds are normal. There is no distension.     Palpations: Abdomen is soft.  Musculoskeletal:        General: Normal range of motion.     Cervical back: Normal range of motion and neck supple.  Skin:    General: Skin is warm and dry.     Coloration: Skin is not pale.     Findings: No rash.  Neurological:     Mental Status: She is alert.     Motor: No abnormal muscle tone.     Coordination: Coordination normal.  Psychiatric:        Behavior: Behavior normal.      Results for orders placed or performed in visit on 01/02/24  POCT Influenza A/B   Collection Time: 01/02/24  9:31 AM  Result Value Ref Range   Influenza A, POC Positive (A) Negative   Influenza B, POC Negative Negative       Assessment & Plan:   1. Flu-like symptoms (Primary)  - POCT Influenza A/B - oseltamivir (TAMIFLU) 75 MG capsule; Take 1 capsule (75 mg total) by mouth 2 (two) times daily for 5 days.  Dispense: 10 capsule; Refill: 0  2. Influenza  A  - oseltamivir (TAMIFLU) 75 MG capsule; Take 1 capsule (75 mg total) by mouth 2 (two) times daily for 5 days.  Dispense: 10 capsule; Refill: 0   pt encouraged to start tamiflu, rest, push fluids, other supportive and symptomatic measures - can use tylenol/ibuprofen OTC, currently mildly tachy, lungs CTA A &P no distress and pt is well hydrated and overall fairly well appearing with stable vital signs Work note given   Tracey Berry, PA-C 01/02/24 9:38  AM

## 2024-01-03 ENCOUNTER — Encounter: Payer: Self-pay | Admitting: Family Medicine

## 2024-01-16 DIAGNOSIS — F411 Generalized anxiety disorder: Secondary | ICD-10-CM | POA: Diagnosis not present

## 2024-01-21 DIAGNOSIS — G35 Multiple sclerosis: Secondary | ICD-10-CM | POA: Diagnosis not present

## 2024-01-22 ENCOUNTER — Telehealth: Payer: Commercial Managed Care - PPO | Admitting: Physician Assistant

## 2024-01-22 ENCOUNTER — Other Ambulatory Visit (HOSPITAL_COMMUNITY): Payer: Self-pay

## 2024-01-22 DIAGNOSIS — R3989 Other symptoms and signs involving the genitourinary system: Secondary | ICD-10-CM | POA: Diagnosis not present

## 2024-01-22 MED ORDER — NITROFURANTOIN MONOHYD MACRO 100 MG PO CAPS
100.0000 mg | ORAL_CAPSULE | Freq: Two times a day (BID) | ORAL | 0 refills | Status: DC
Start: 2024-01-22 — End: 2024-02-06
  Filled 2024-01-22: qty 10, 5d supply, fill #0

## 2024-01-22 NOTE — Progress Notes (Signed)

## 2024-01-30 DIAGNOSIS — F411 Generalized anxiety disorder: Secondary | ICD-10-CM | POA: Diagnosis not present

## 2024-02-04 ENCOUNTER — Other Ambulatory Visit: Payer: Self-pay

## 2024-02-07 ENCOUNTER — Encounter: Admitting: Family Medicine

## 2024-02-07 ENCOUNTER — Other Ambulatory Visit: Payer: Self-pay

## 2024-02-07 DIAGNOSIS — Z0000 Encounter for general adult medical examination without abnormal findings: Secondary | ICD-10-CM

## 2024-02-13 DIAGNOSIS — F411 Generalized anxiety disorder: Secondary | ICD-10-CM | POA: Diagnosis not present

## 2024-02-18 DIAGNOSIS — G35 Multiple sclerosis: Secondary | ICD-10-CM | POA: Diagnosis not present

## 2024-02-27 DIAGNOSIS — F411 Generalized anxiety disorder: Secondary | ICD-10-CM | POA: Diagnosis not present

## 2024-03-12 DIAGNOSIS — F411 Generalized anxiety disorder: Secondary | ICD-10-CM | POA: Diagnosis not present

## 2024-03-16 DIAGNOSIS — Z131 Encounter for screening for diabetes mellitus: Secondary | ICD-10-CM | POA: Diagnosis not present

## 2024-03-16 DIAGNOSIS — R350 Frequency of micturition: Secondary | ICD-10-CM | POA: Diagnosis not present

## 2024-03-16 DIAGNOSIS — Z1329 Encounter for screening for other suspected endocrine disorder: Secondary | ICD-10-CM | POA: Diagnosis not present

## 2024-03-16 DIAGNOSIS — Z01419 Encounter for gynecological examination (general) (routine) without abnormal findings: Secondary | ICD-10-CM | POA: Diagnosis not present

## 2024-03-16 DIAGNOSIS — Z13220 Encounter for screening for lipoid disorders: Secondary | ICD-10-CM | POA: Diagnosis not present

## 2024-03-16 DIAGNOSIS — Z0000 Encounter for general adult medical examination without abnormal findings: Secondary | ICD-10-CM | POA: Diagnosis not present

## 2024-03-16 DIAGNOSIS — Z114 Encounter for screening for human immunodeficiency virus [HIV]: Secondary | ICD-10-CM | POA: Diagnosis not present

## 2024-03-16 DIAGNOSIS — N920 Excessive and frequent menstruation with regular cycle: Secondary | ICD-10-CM | POA: Diagnosis not present

## 2024-03-16 DIAGNOSIS — D259 Leiomyoma of uterus, unspecified: Secondary | ICD-10-CM | POA: Diagnosis not present

## 2024-03-16 DIAGNOSIS — Z1159 Encounter for screening for other viral diseases: Secondary | ICD-10-CM | POA: Diagnosis not present

## 2024-03-16 DIAGNOSIS — Z130 Encounter for screening for diseases of the blood and blood-forming organs and certain disorders involving the immune mechanism: Secondary | ICD-10-CM | POA: Diagnosis not present

## 2024-03-17 ENCOUNTER — Telehealth: Payer: Self-pay | Admitting: *Deleted

## 2024-03-17 ENCOUNTER — Other Ambulatory Visit: Payer: Self-pay | Admitting: *Deleted

## 2024-03-17 ENCOUNTER — Other Ambulatory Visit: Payer: Self-pay

## 2024-03-17 DIAGNOSIS — Z79899 Other long term (current) drug therapy: Secondary | ICD-10-CM

## 2024-03-17 DIAGNOSIS — G35 Multiple sclerosis: Secondary | ICD-10-CM

## 2024-03-17 DIAGNOSIS — Z131 Encounter for screening for diabetes mellitus: Secondary | ICD-10-CM | POA: Diagnosis not present

## 2024-03-17 DIAGNOSIS — Z13220 Encounter for screening for lipoid disorders: Secondary | ICD-10-CM | POA: Diagnosis not present

## 2024-03-17 DIAGNOSIS — Z1159 Encounter for screening for other viral diseases: Secondary | ICD-10-CM | POA: Diagnosis not present

## 2024-03-17 DIAGNOSIS — Z114 Encounter for screening for human immunodeficiency virus [HIV]: Secondary | ICD-10-CM | POA: Diagnosis not present

## 2024-03-17 DIAGNOSIS — Z0000 Encounter for general adult medical examination without abnormal findings: Secondary | ICD-10-CM | POA: Diagnosis not present

## 2024-03-17 DIAGNOSIS — Z1329 Encounter for screening for other suspected endocrine disorder: Secondary | ICD-10-CM | POA: Diagnosis not present

## 2024-03-17 DIAGNOSIS — Z01419 Encounter for gynecological examination (general) (routine) without abnormal findings: Secondary | ICD-10-CM | POA: Diagnosis not present

## 2024-03-17 DIAGNOSIS — Z130 Encounter for screening for diseases of the blood and blood-forming organs and certain disorders involving the immune mechanism: Secondary | ICD-10-CM | POA: Diagnosis not present

## 2024-03-17 DIAGNOSIS — R350 Frequency of micturition: Secondary | ICD-10-CM | POA: Diagnosis not present

## 2024-03-17 NOTE — Telephone Encounter (Signed)
 Placed JCV lab in [facility] lock box for routine lab pick up. Results pending.

## 2024-03-18 LAB — CBC WITH DIFFERENTIAL/PLATELET
Basophils Absolute: 0.1 10*3/uL (ref 0.0–0.2)
Basos: 1 %
EOS (ABSOLUTE): 0.2 10*3/uL (ref 0.0–0.4)
Eos: 2 %
Hematocrit: 37.3 % (ref 34.0–46.6)
Hemoglobin: 12.3 g/dL (ref 11.1–15.9)
Immature Grans (Abs): 0 10*3/uL (ref 0.0–0.1)
Immature Granulocytes: 0 %
Lymphocytes Absolute: 3.8 10*3/uL — ABNORMAL HIGH (ref 0.7–3.1)
Lymphs: 39 %
MCH: 30.2 pg (ref 26.6–33.0)
MCHC: 33 g/dL (ref 31.5–35.7)
MCV: 92 fL (ref 79–97)
Monocytes Absolute: 0.6 10*3/uL (ref 0.1–0.9)
Monocytes: 6 %
Neutrophils Absolute: 5 10*3/uL (ref 1.4–7.0)
Neutrophils: 52 %
Platelets: 150 10*3/uL (ref 150–450)
RBC: 4.07 x10E6/uL (ref 3.77–5.28)
RDW: 12.2 % (ref 11.7–15.4)
WBC: 9.6 10*3/uL (ref 3.4–10.8)

## 2024-03-25 DIAGNOSIS — D252 Subserosal leiomyoma of uterus: Secondary | ICD-10-CM | POA: Diagnosis not present

## 2024-03-26 DIAGNOSIS — F411 Generalized anxiety disorder: Secondary | ICD-10-CM | POA: Diagnosis not present

## 2024-04-01 NOTE — Progress Notes (Signed)
 Chief Complaint  Patient presents with   Follow-up    Pt in room 1. Alone. Here for MS follow up. DMT: Tysabri . Pt reports doing well, no concerns. In process of scheduling fibroid surgery.    HISTORY OF PRESENT ILLNESS:  04/02/24 ALL:  Tracey Lindsey is a 33 y.o. female here today for follow up for RRMS. She continues Tysabri . Last JCV 0.11, negative 03/2024. MRI brain and cervical spine stable 09/2023 with exception of 1 new focus towards left adjacent to C3-C4.   She feels that she is doing fairly well. No changes with gait or vision. She works full time. No falls. Left shoulder pain has improved following injection at last visit. She hasn't noticed any numbness of extremities, recently. 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   She continues buspirone  10-20mg  at bedtime. Mood is good. Anxiety is well managed. She is resting well.   No UTI symptoms recently. No longer following with urology. Planning fibromectomy with GYN soon.    No no vision loss. No changes in vision. She continues vitamin D  OTC.   HISTORY (copied from Dr Thom Fleeting previous note)  Tracey Lindsey is a 33 y.o. woman recently diagnosed with MS.      Update 09/10/2023 She is on Tysabri ,   she tolerates it well.  Her MS has been stable with no recent exacerbations or new neurologic symptoms.     She is JCV Ab negative 02/18/2023   She has had left arm pain with tingling.  This bothers her more at night but is present during the day.  Sleeping on her left side increases the pain.   Of note, she had an exacerbation with left sided numbness April 2016.   However, she completely improved and had no left sided numbness until more recently.   She denies neck pain but is uncomfortable if she lays on left side.   She denies any change in bladder (same issue with frequency).      She denies any major problems with her gait and can walk a couple miles.   She uses the bannister on  stairs for safety but can go down without it..    She denies weakness.     She has urinary frequency and  3x nocturia .  She has no incontinence.  She denies hesitancy.    Two UTI since the last visit.    Vision is ok but she notes some blurriness in the distance.    This is symmetric.   Colors are symmetric.       She has fatigue daily but accomplishes most of what she needs to.   She works 2 jobs.. She sleeps ok but occasioanlly has trouble falling back asleep when she wakes up in middle of the night.   She has more stress recently.    She denies depression but has mid anxiety helped by Buspar .    Cognition is doing well.       MS History:    In retrospect, she had an episode of vertigo in 2014 with mild vision changes that improved.    In March 2016, she had vertigo that improved after a couple weeks.  In late April, she had  numbness in the left foot. When she woke up the next morning, she noted that the numbness was higher up the left arm and the next day it also involved the left hand and she  went to the emergency room. In the emergency room, she had an MRI of the brain that showed white matter foci were some for multiple sclerosis.      IMAGING MRI of the brain with and without contrast 03/29/15 showed several posterior fossa lesions with one in the right middle cerebellar peduncle and several in the pons.   The middle cerebellar peduncle focus and 2 of the pontine foci enhanced. There are also several periventricular and deep white matter foci in the cerebral hemispheres.   MRI cervical spine shows one enhancing focus.  Repeat imaging of the brain and cervical spine November 2017 did not show any new lesions. The previously enhancing lesions were smaller and did not enhance.Aaron Aas      MRI of the brain 02/17/2020 showed Scattered T2/FLAIR hyperintense foci in the hemispheres.  Though nonspecific, the foci are consistent with the diagnosis of multiple sclerosis.  None of the foci appear to be acute  and there are no new lesions compared to the 2019 MRI.   There is a partially empty sella turcica, unchanged in appearance since 2019.  This could be incidental or be associated with elevated intracranial pressure.   MRi of the cervical spiine 02/17/2020 showed T2 hyperintense focus within the spinal cord to the left adjacent to C3-C4.  This was also present on the 2017 MRI and is consistent with a chronic demyelinating plaque associated with multiple sclerosis.  There are no degenerative changes noted   MRI of the brain and cervical spine 08/29/2021 unchanged compared to 02/17/2020   REVIEW OF SYSTEMS: Out of a complete 14 system review of symptoms, the patient complains only of the following symptoms, intermittent low back pain, anxiety and all other reviewed systems are negative.   ALLERGIES: No Known Allergies   HOME MEDICATIONS: Outpatient Medications Prior to Visit  Medication Sig Dispense Refill   Ascorbic Acid (VITAMIN C PO) Take by mouth.     Cholecalciferol (VITAMIN D3) 50 MCG (2000 UT) CAPS Take 2,000 Units by mouth daily.     D-Mannose 500 MG CAPS      natalizumab  (TYSABRI ) 300 MG/15ML injection Inject 15 mLs into the vein every 30 (thirty) days.      busPIRone  (BUSPAR ) 10 MG tablet Take 1 tablet (10 mg total) by mouth 2 (two) times daily. 180 tablet 1   Facility-Administered Medications Prior to Visit  Medication Dose Route Frequency Provider Last Rate Last Admin   gadopentetate dimeglumine  (MAGNEVIST ) injection 18 mL  18 mL Intravenous Once PRN Sater, Sherida Dimmer, MD         PAST MEDICAL HISTORY: Past Medical History:  Diagnosis Date   Anxiety    Chlamydia    Depression    History of abnormal cervical Papanicolaou smear    MS (multiple sclerosis) (HCC) April 2016   PCOS (polycystic ovarian syndrome)    Vitamin D  deficiency      PAST SURGICAL HISTORY: Past Surgical History:  Procedure Laterality Date   NO PAST SURGERIES       FAMILY HISTORY: Family History   Problem Relation Age of Onset   Diabetes Father    Hypertension Father    Stroke Father    Cancer Mother        lung   Hyperlipidemia Mother    Lung disease Mother    Eczema Brother    Colon cancer Maternal Aunt    Other Maternal Grandmother    Cancer Maternal Grandmother    Breast cancer Neg Hx  Ovarian cancer Neg Hx      SOCIAL HISTORY: Social History   Socioeconomic History   Marital status: Single    Spouse name: Not on file   Number of children: Not on file   Years of education: 18   Highest education level: Master's degree (e.g., MA, MS, MEng, MEd, MSW, MBA)  Occupational History   Occupation: Visual merchandiser  Tobacco Use   Smoking status: Never   Smokeless tobacco: Never  Vaping Use   Vaping status: Never Used  Substance and Sexual Activity   Alcohol use: Not Currently    Comment: occasionally   Drug use: Never   Sexual activity: Yes    Partners: Male    Birth control/protection: None  Other Topics Concern   Not on file  Social History Narrative   Lives with mother    Drinks soda daily (8-10 ounces)   Social Drivers of Health   Financial Resource Strain: Low Risk  (02/04/2024)   Overall Financial Resource Strain (CARDIA)    Difficulty of Paying Living Expenses: Not hard at all  Food Insecurity: No Food Insecurity (02/04/2024)   Hunger Vital Sign    Worried About Running Out of Food in the Last Year: Never true    Ran Out of Food in the Last Year: Never true  Transportation Needs: No Transportation Needs (02/04/2024)   PRAPARE - Administrator, Civil Service (Medical): No    Lack of Transportation (Non-Medical): No  Physical Activity: Insufficiently Active (02/04/2024)   Exercise Vital Sign    Days of Exercise per Week: 1 day    Minutes of Exercise per Session: 30 min  Stress: No Stress Concern Present (02/04/2024)   [employer] of Occupational Health - Occupational Stress Questionnaire    Feeling of Stress : Only a little   Social Connections: Moderately Isolated (02/04/2024)   Social Connection and Isolation Panel [NHANES]    Frequency of Communication with Friends and Family: More than three times a week    Frequency of Social Gatherings with Friends and Family: Twice a week    Attends Religious Services: More than 4 times per year    Active Member of [employer] or Organizations: No    Attends Engineer, structural: Not on file    Marital Status: Never married  Intimate Partner Violence: Not At Risk (11/22/2023)   Received from [facility]   Humiliation, Afraid, Rape, and Kick questionnaire    Fear of Current or Ex-Partner: No    Emotionally Abused: No    Physically Abused: No    Sexually Abused: No     PHYSICAL EXAM  Vitals:   04/02/24 1356  BP: 123/74  Pulse: 85  Weight: 206 lb 6.4 oz (93.6 kg)  Height: 5\' 3"  (1.6 m)     Body mass index is 36.56 kg/m.  Generalized: Well developed, in no acute distress  Cardiology: normal rate and rhythm, no murmur auscultated  Respiratory: clear to auscultation bilaterally    Neurological examination  Mentation: Alert oriented to time, place, history taking. Follows all commands speech and language fluent Cranial nerve II-XII: Pupils were equal round reactive to light. Extraocular movements were full, visual field were full on confrontational test. Facial sensation and strength were normal. Uvula tongue midline. Head turning and shoulder shrug  were normal and symmetric. Motor: The motor testing reveals 5 over 5 strength of all 4 extremities. Good symmetric motor tone is noted throughout.  Sensory: Sensory testing is intact  to soft touch on all 4 extremities. No evidence of extinction is noted.  Coordination: Cerebellar testing reveals good finger-nose-finger and heel-to-shin bilaterally.  Gait and station: Gait is normal.  Reflexes: Deep tendon reflexes are symmetric and normal bilaterally.    DIAGNOSTIC DATA (LABS, IMAGING, TESTING) - I  reviewed patient records, labs, notes, testing and imaging myself where available.  Lab Results  Component Value Date   WBC 9.6 03/17/2024   HGB 12.3 03/17/2024   HCT 37.3 03/17/2024   MCV 92 03/17/2024   PLT 150 03/17/2024      Component Value Date/Time   NA 140 12/26/2022 1017   K 3.9 12/26/2022 1017   CL 105 12/26/2022 1017   CO2 23 12/26/2022 1017   GLUCOSE 87 12/26/2022 1017   GLUCOSE 84 05/30/2020 1512   BUN 8 12/26/2022 1017   CREATININE 0.66 12/26/2022 1017   CREATININE 0.72 05/30/2020 1512   CALCIUM 9.0 12/26/2022 1017   PROT 6.5 12/26/2022 1017   ALBUMIN 4.4 12/26/2022 1017   AST 14 12/26/2022 1017   ALT 11 12/26/2022 1017   ALKPHOS 51 12/26/2022 1017   BILITOT 0.3 12/26/2022 1017   GFRNONAA 118 08/09/2020 1446   GFRNONAA 114 05/30/2020 1512   GFRAA 136 08/09/2020 1446   GFRAA 132 05/30/2020 1512   Lab Results  Component Value Date   CHOL 205 (H) 12/26/2022   HDL 46 12/26/2022   LDLCALC 145 (H) 12/26/2022   TRIG 75 12/26/2022   CHOLHDL 4.5 (H) 12/26/2022   Lab Results  Component Value Date   HGBA1C 5.5 12/26/2022   Lab Results  Component Value Date   VITAMINB12 1,350 (H) 02/22/2022   Lab Results  Component Value Date   TSH 2.270 12/26/2022        No data to display               No data to display           ASSESSMENT AND PLAN  33 y.o. year old female  has a past medical history of Anxiety, Chlamydia, Depression, History of abnormal cervical Papanicolaou smear, MS (multiple sclerosis) (HCC) (April 2016), PCOS (polycystic ovarian syndrome), and Vitamin D  deficiency. here with    MS (multiple sclerosis) (HCC)  High risk medication use  Vitamin D  deficiency  Urinary dysfunction  Depression with anxiety   Tracey Lindsey is doing well, today. We will continue Tysabri  infusions every 4 weeks. Labs are stable. MRI showed 1 new cervical lesion since 2022. Will monitor closely. She will continue buspirone  up to 20mg  daily for anxiety.  Continue counselin as needed Healthy lifestyle habits encouraged. She will return to see Dr Godwin Lat in 6 months.   No orders of the defined types were placed in this encounter.    Meds ordered this encounter  Medications   busPIRone  (BUSPAR ) 10 MG tablet    Sig: Take 1 tablet (10 mg total) by mouth 2 (two) times daily.    Dispense:  180 tablet    Refill:  1    Supervising Provider:   Glory Larsen [[id number]]      Terrilyn Fick, MSN, FNP-C 04/02/2024, 2:24 PM  [facility] [address] [place], [place] [address] [phone]

## 2024-04-01 NOTE — Patient Instructions (Signed)
Below is our plan:  We will continue current treatment plan.   Please make sure you are staying well hydrated. I recommend 50-60 ounces daily. Well balanced diet and regular exercise encouraged. Consistent sleep schedule with 6-8 hours recommended.   Please continue follow up with care team as directed.   Follow up with Dr Sater in 6 months   You may receive a survey regarding today's visit. I encourage you to leave honest feed back as I do use this information to improve patient care. Thank you for seeing me today!    

## 2024-04-02 ENCOUNTER — Other Ambulatory Visit (HOSPITAL_COMMUNITY): Payer: Self-pay

## 2024-04-02 ENCOUNTER — Encounter: Payer: Self-pay | Admitting: Family Medicine

## 2024-04-02 ENCOUNTER — Ambulatory Visit: Payer: Commercial Managed Care - PPO | Admitting: Family Medicine

## 2024-04-02 ENCOUNTER — Other Ambulatory Visit: Payer: Self-pay

## 2024-04-02 VITALS — BP 123/74 | HR 85 | Ht 63.0 in | Wt 206.4 lb

## 2024-04-02 DIAGNOSIS — F418 Other specified anxiety disorders: Secondary | ICD-10-CM

## 2024-04-02 DIAGNOSIS — E559 Vitamin D deficiency, unspecified: Secondary | ICD-10-CM | POA: Diagnosis not present

## 2024-04-02 DIAGNOSIS — Z79899 Other long term (current) drug therapy: Secondary | ICD-10-CM

## 2024-04-02 DIAGNOSIS — R39198 Other difficulties with micturition: Secondary | ICD-10-CM

## 2024-04-02 DIAGNOSIS — G35 Multiple sclerosis: Secondary | ICD-10-CM

## 2024-04-02 MED ORDER — BUSPIRONE HCL 10 MG PO TABS
10.0000 mg | ORAL_TABLET | Freq: Two times a day (BID) | ORAL | 1 refills | Status: AC
  Filled 2024-04-02: qty 180, 90d supply, fill #0

## 2024-04-09 DIAGNOSIS — F411 Generalized anxiety disorder: Secondary | ICD-10-CM | POA: Diagnosis not present

## 2024-04-14 DIAGNOSIS — G35 Multiple sclerosis: Secondary | ICD-10-CM | POA: Diagnosis not present

## 2024-04-20 NOTE — Telephone Encounter (Signed)
 Received notification in EPIC that pt had not read mychart. I called and read mychart message to pt that Amy sent. She verbalized understanding and had no further questions.

## 2024-04-23 DIAGNOSIS — F411 Generalized anxiety disorder: Secondary | ICD-10-CM | POA: Diagnosis not present

## 2024-05-07 DIAGNOSIS — F411 Generalized anxiety disorder: Secondary | ICD-10-CM | POA: Diagnosis not present

## 2024-05-12 DIAGNOSIS — G35 Multiple sclerosis: Secondary | ICD-10-CM | POA: Diagnosis not present

## 2024-05-21 DIAGNOSIS — F411 Generalized anxiety disorder: Secondary | ICD-10-CM | POA: Diagnosis not present

## 2024-06-04 DIAGNOSIS — F411 Generalized anxiety disorder: Secondary | ICD-10-CM | POA: Diagnosis not present

## 2024-06-10 DIAGNOSIS — G35 Multiple sclerosis: Secondary | ICD-10-CM | POA: Diagnosis not present

## 2024-06-12 ENCOUNTER — Other Ambulatory Visit: Payer: Self-pay | Admitting: Obstetrics and Gynecology

## 2024-06-18 ENCOUNTER — Encounter: Payer: Self-pay | Admitting: Family Medicine

## 2024-06-18 ENCOUNTER — Other Ambulatory Visit (HOSPITAL_COMMUNITY): Payer: Self-pay

## 2024-06-18 ENCOUNTER — Ambulatory Visit: Payer: Self-pay

## 2024-06-18 ENCOUNTER — Telehealth: Admitting: Family Medicine

## 2024-06-18 DIAGNOSIS — F411 Generalized anxiety disorder: Secondary | ICD-10-CM | POA: Diagnosis not present

## 2024-06-18 DIAGNOSIS — R109 Unspecified abdominal pain: Secondary | ICD-10-CM

## 2024-06-18 DIAGNOSIS — R350 Frequency of micturition: Secondary | ICD-10-CM

## 2024-06-18 LAB — POCT URINALYSIS DIPSTICK
Appearance: NORMAL
Bilirubin, UA: NEGATIVE
Blood, UA: NEGATIVE
Glucose, UA: NEGATIVE
Ketones, UA: NEGATIVE
Leukocytes, UA: NEGATIVE
Nitrite, UA: NEGATIVE
Protein, UA: POSITIVE — AB
Spec Grav, UA: 1.01 (ref 1.010–1.025)
Urobilinogen, UA: 0.2 U/dL
pH, UA: 7.5 (ref 5.0–8.0)

## 2024-06-18 MED ORDER — ONDANSETRON 4 MG PO TBDP
4.0000 mg | ORAL_TABLET | Freq: Three times a day (TID) | ORAL | 0 refills | Status: DC | PRN
Start: 2024-06-18 — End: 2024-06-18

## 2024-06-18 MED ORDER — ONDANSETRON 4 MG PO TBDP
4.0000 mg | ORAL_TABLET | Freq: Three times a day (TID) | ORAL | 0 refills | Status: AC | PRN
Start: 2024-06-18 — End: ?
  Filled 2024-06-18: qty 10, 4d supply, fill #0

## 2024-06-18 MED ORDER — CEPHALEXIN 500 MG PO CAPS
500.0000 mg | ORAL_CAPSULE | Freq: Two times a day (BID) | ORAL | 0 refills | Status: AC
Start: 2024-06-18 — End: 2024-06-23
  Filled 2024-06-18: qty 10, 5d supply, fill #0

## 2024-06-18 NOTE — Telephone Encounter (Signed)
 Called [facility] and front desk checked with nurse and got the ok for pt to drop off urine and then complete virtual appt. Scheduled virtual for 11am

## 2024-06-18 NOTE — Telephone Encounter (Signed)
 FYI Only or Action Required?: FYI only for provider.  Patient was last seen in primary care on jan 2025.  Called Nurse Triage reporting Urinary Frequency.  Symptoms began a week ago.  Interventions attempted: Nothing.  Symptoms are: gradually worsening.  Triage Disposition: See PCP When Office is Open (Within 3 Days)  Patient/caregiver understands and will follow disposition?: Yes           Copied from CRM [id number]. Topic: Clinical - Red Word Triage >> Jun 18, 2024  9:08 AM Tobias CROME wrote: Red Word that prompted transfer to Nurse Triage: Possible UTI, back pain, urination frequency Reason for Disposition  [1] MILD pain (i.e., scale 1-3; does not interfere with normal activities) AND [2] present > 3 days  Answer Assessment - Initial Assessment Questions 1. LOCATION: Where does it hurt? (e.g., left, right)     both 2. ONSET: When did the pain start?     Week ago 3. SEVERITY: How bad is the pain? (e.g., Scale 1-10; mild, moderate, or severe)   5/10 4. PATTERN: Does the pain come and go, or is it constant?      Comes and goes 5. CAUSE: What do you think is causing the pain?     Possible uit 6. OTHER SYMPTOMS:  Do you have any other symptoms? (e.g., fever, abdomen pain, vomiting, leg weakness, burning with urination, blood in urine)     Urinary frequency  Protocols used: Flank Pain-A-AH

## 2024-06-18 NOTE — Progress Notes (Signed)
 Name: Tracey Lindsey   MRN: [id number]    DOB: [birth year]   Date:06/18/2024       Progress Note  Subjective:    Chief Complaint  Chief Complaint  Patient presents with   Flank Pain    X1 week, no dysuria    I connected with  Tracey Lindsey  on 06/18/24 at 11:00 AM EDT by a video enabled telemedicine application and verified that I am speaking with the correct person using two identifiers.  I discussed the limitations of evaluation and management by telemedicine and the availability of in person appointments. The patient expressed understanding and agreed to proceed. Staff also discussed with the patient that there may be a patient responsible charge related to this service. Patient Location: at work Insurance underwriter)  Provider Location: [facility] clinic office Additional Individuals present:  none  HPI Urinary freq, suprapubic tenderness/pressure and bilateral flank pain x 1 week with some associated nausea, concern for UTI.  Results for orders placed or performed in visit on 06/18/24  POCT Urinalysis Dipstick   Collection Time: 06/18/24  9:32 AM  Result Value Ref Range   Color, UA Yellow    Clarity, UA Clear    Glucose, UA Negative Negative   Bilirubin, UA Negative    Ketones, UA Negative    Spec Grav, UA 1.010 1.010 - 1.025   Blood, UA Negative    pH, UA 7.5 5.0 - 8.0   Protein, UA Positive (A) Negative   Urobilinogen, UA 0.2 0.2 or 1.0 E.U./dL   Nitrite, UA Negative    Leukocytes, UA Negative Negative   Appearance Normal    Odor None       Patient Active Problem List   Diagnosis Date Noted   Dysesthesia affecting both sides of body 02/07/2021   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   Nocturia 08/05/2019   Insulin  resistance 02/18/2019   High risk medication use 01/24/2017   Other headache syndrome 07/24/2016   Obesity 11/19/2015   Verruca vulgaris 06/08/2015   Multiple sclerosis (HCC) 04/04/2015   Numbness 04/04/2015   Vitamin D  deficiency 04/04/2015   MS (multiple sclerosis)  (HCC) 03/04/2015    Social History   Tobacco Use   Smoking status: Never   Smokeless tobacco: Never  Substance Use Topics   Alcohol use: Not Currently    Comment: occasionally     Current Outpatient Medications:    Ascorbic Acid (VITAMIN C PO), Take by mouth., Disp: , Rfl:    busPIRone  (BUSPAR ) 10 MG tablet, Take 1 tablet (10 mg total) by mouth 2 (two) times daily., Disp: 180 tablet, Rfl: 1   Cholecalciferol (VITAMIN D3) 50 MCG (2000 UT) CAPS, Take 2,000 Units by mouth daily., Disp: , Rfl:    D-Mannose 500 MG CAPS, , Disp: , Rfl:    natalizumab  (TYSABRI ) 300 MG/15ML injection, Inject 15 mLs into the vein every 30 (thirty) days. , Disp: , Rfl:  No current facility-administered medications for this visit.  Facility-Administered Medications Ordered in Other Visits:    gadopentetate dimeglumine  (MAGNEVIST ) injection 18 mL, 18 mL, Intravenous, Once PRN, Sater, Charlie LABOR, MD  No Known Allergies    Review of Systems  Constitutional: Negative.  Negative for activity change, appetite change, chills, diaphoresis, fatigue and fever.  HENT: Negative.    Eyes: Negative.   Respiratory: Negative.    Cardiovascular: Negative.   Gastrointestinal:  Positive for abdominal pain (suprapubic) and nausea. Negative for diarrhea and vomiting.  Endocrine: Negative.   Genitourinary:  Positive for  flank pain, frequency and pelvic pain. Negative for difficulty urinating, hematuria, vaginal bleeding, vaginal discharge and vaginal pain.  Skin: Negative.   Allergic/Immunologic: Negative.   Neurological: Negative.   Hematological: Negative.   Psychiatric/Behavioral: Negative.    All other systems reviewed and are negative.     Objective:   Virtual encounter, vitals limited, only able to obtain the following There were no vitals filed for this visit. There is no height or weight on file to calculate BMI. Nursing Note and Vital Signs reviewed.  Physical Exam Vitals reviewed.  Constitutional:       General: She is not in acute distress.    Appearance: She is not ill-appearing, toxic-appearing or diaphoretic.  Pulmonary:     Effort: No respiratory distress.  Neurological:     Mental Status: She is alert.  Psychiatric:        Mood and Affect: Mood normal.     PE limited by virtual encounter  Results for orders placed or performed in visit on 06/18/24 (from the past 72 hours)  POCT Urinalysis Dipstick     Status: Abnormal   Collection Time: 06/18/24  9:32 AM  Result Value Ref Range   Color, UA Yellow    Clarity, UA Clear    Glucose, UA Negative Negative   Bilirubin, UA Negative    Ketones, UA Negative    Spec Grav, UA 1.010 1.010 - 1.025   Blood, UA Negative    pH, UA 7.5 5.0 - 8.0   Protein, UA Positive (A) Negative    Comment: Trace   Urobilinogen, UA 0.2 0.2 or 1.0 E.U./dL   Nitrite, UA Negative    Leukocytes, UA Negative Negative   Appearance Normal    Odor None     Assessment and Plan:     ICD-10-CM   1. Flank pain  R10.9 POCT Urinalysis Dipstick    Urine Culture    cephALEXin  (KEFLEX ) 500 MG capsule    ondansetron  (ZOFRAN -ODT) 4 MG disintegrating tablet    DISCONTINUED: ondansetron  (ZOFRAN -ODT) 4 MG disintegrating tablet    2. Urinary frequency  R35.0 cephALEXin  (KEFLEX ) 500 MG capsule    ondansetron  (ZOFRAN -ODT) 4 MG disintegrating tablet    DISCONTINUED: ondansetron  (ZOFRAN -ODT) 4 MG disintegrating tablet      Next Friday pt have uterine fibroid surgery - this may be affecting urinary sx as well as her hx of MS which causes urinary freq She does not have hx of positive urine cultures or resistance At this time with current sx, dip results and hx of immunocompromise pt wishes to start with empiric abx for possible UTI and we will follow the culture.    -Red flags and when to present for emergency care or RTC including worsening sx, N/V inability to take abx, or unresolved sx reviewed with patient at time of visit. Follow up and care instructions  discussed and provided in AVS. - I discussed the assessment and treatment plan with the patient. The patient was provided an opportunity to ask questions and all were answered. The patient agreed with the plan and demonstrated an understanding of the instructions.  I provided 16 minutes of non-face-to-face time during this encounter.  Michelene Cower, PA-C 06/18/24 11:32 AM

## 2024-06-19 ENCOUNTER — Encounter (HOSPITAL_COMMUNITY): Payer: Self-pay | Admitting: Obstetrics and Gynecology

## 2024-06-19 LAB — URINE CULTURE
MICRO NUMBER:: 16713005
Result:: NO GROWTH
SPECIMEN QUALITY:: ADEQUATE

## 2024-06-19 NOTE — Pre-Procedure Instructions (Signed)
 Surgical Instructions   Your procedure is scheduled on :  Friday,  06-26-2024. Report to [facility] Main Entrance A at 9:00  A.M., then check in with the Admitting office. Any questions or running late day of surgery: call [phone]  Questions prior to your surgery date: call [phone], Monday-Friday, 8am-4pm. If you experience any cold or flu symptoms such as cough, fever, chills, shortness of breath, etc. between now and your scheduled surgery, please notify your surgeon office.    Remember:  Do not eat any food and do not drink any liquids after midnight the night before your surgery.  This includes no water,  candy,  gum,  and  mints.    Take these medicines the morning of surgery with A SIPS OF WATER :  Buspirone  (buspar )   May take these medicines IF NEEDED: Ondansetron  (zofran )   One week prior to surgery, STOP taking any Aspirin (unless otherwise instructed by your surgeon) Aleve , Naproxen , Ibuprofen, Motrin, Advil, Goody's, BC's, all herbal medications, fish oil, and non-prescription vitamins.                     Do NOT Smoke (Tobacco/Vaping) and Do Not drink alcohol for 24 hours prior to your procedure.  If you use a CPAP at night, you may bring your mask/headgear for your overnight stay.   You will be asked to remove any contacts, glasses, piercing's, hearing aid's, dentures/partials prior to surgery. Please bring cases for these items if needed.    Patients discharged the day of surgery will not be allowed to drive home, and someone needs to stay with them for 24 hours.  SURGICAL WAITING ROOM VISITATION Patients may have no more than 2 support people in the waiting area - these visitors may rotate.   Pre-op nurse will coordinate an appropriate time for 1 ADULT support person, who may not rotate, to accompany patient in pre-op.  Children under the age of 17 must have an adult with them who is not the patient and must remain in the main waiting area with an  adult.  If the patient needs to stay at the hospital during part of their recovery, the visitor guidelines for inpatient rooms apply.  Please refer to the [facility] website for the visitor guidelines for any additional information.   If you received a COVID test during your pre-op visit  it is requested that you wear a mask when out in public, stay away from anyone that may not be feeling well and notify your surgeon if you develop symptoms. If you have been in contact with anyone that has tested positive in the last 10 days please notify you surgeon.      Pre-operative CHG Bathing Instructions   You can play a key role in reducing the risk of infection after surgery. Your skin needs to be as free of germs as possible. You can reduce the number of germs on your skin by washing with CHG (chlorhexidine gluconate) soap before surgery. CHG is an antiseptic soap that kills germs and continues to kill germs even after washing.   DO NOT use if you have an allergy to chlorhexidine/CHG or antibacterial soaps. If your skin becomes reddened or irritated, stop using the CHG and notify Pre-op nurse day of surgery.             TAKE A SHOWER THE NIGHT BEFORE SURGERY AND THE DAY OF SURGERY    Please keep in mind the following:  DO  NOT shave, including legs and underarms, 48 hours prior to surgery.   You may shave your face before/day of surgery.  Place clean sheets on your bed the night before surgery Use a clean washcloth (not used since being washed) for each shower. DO NOT sleep with pet's night before surgery.  CHG Shower Instructions:  Wash your face and private area with normal soap. If you choose to wash your hair, wash first with your normal shampoo.  After you use shampoo/soap, rinse your hair and body thoroughly to remove shampoo/soap residue.  Turn the water OFF and apply half the bottle of CHG soap to a CLEAN washcloth.  Apply CHG soap ONLY FROM YOUR NECK DOWN TO YOUR TOES (washing for  3-5 minutes)  DO NOT use CHG soap on face, private areas, open wounds, or sores.  Pay special attention to the area where your surgery is being performed.  If you are having back surgery, having someone wash your back for you may be helpful. Wait 2 minutes after CHG soap is applied, then you may rinse off the CHG soap.  Pat dry with a clean towel  Put on clean pajamas    Additional instructions for the day of surgery: DO NOT APPLY any lotions,  powder,  oils,  deodorants (may use underarm deodorant) , cologne/ perfumes  or makeup Do not wear jewelry/ piercing's/  metal/  permanent jewelry must be removed prior to arrival day of surgery.  (No plastic piercing) Do not wear nail polish, gel polish, artificial nails, or any other type of covering on natural finger nails (toe nails are okay) Do not bring valuables to the hospital. [facility] is not responsible for valuables/personal belongings. Put on clean/comfortable clothes.  Please brush your teeth.  Ask your nurse before applying any prescription medications to the skin.

## 2024-06-19 NOTE — Progress Notes (Signed)
 Spoke w/ via phone for pre-op interview--- pt Lab needs dos----    upt     Lab results------ lab appt 06-22-2024 @ 0930 getting CBC/ T&S COVID test -----patient states asymptomatic no test needed Arrive at ------- 0900 on 06-26-2024 NPO after MN w/ exception sips of water w/ meds Pre-Surgery Ensure or G2: n/a  Med rec completed Medications to take morning of surgery ----- buspar / if needed take zofran  Diabetic medication ----- n/a  GLP1 agonist last dose: n/a GLP1 instructions:  Patient instructed no nail polish to be worn day of surgery Patient instructed to bring photo id and insurance card day of surgery Patient aware to have Driver (ride ) / caregiver    for 24 hours after surgery - mother, Tracey Lindsey Patient Special Instructions ----- will pick up bag w/ soap and written instructions at lab appt Pre-Op special Instructions ----- n/a  Patient verbalized understanding of instructions that were given at this phone interview. Patient denies chest pain, sob, fever, cough at the interview.

## 2024-06-22 ENCOUNTER — Encounter (HOSPITAL_COMMUNITY)
Admission: RE | Admit: 2024-06-22 | Discharge: 2024-06-22 | Disposition: A | Discharge status: Home / Self Care | Source: Ambulatory Visit | Attending: Obstetrics and Gynecology | Admitting: Obstetrics and Gynecology

## 2024-06-22 ENCOUNTER — Ambulatory Visit: Payer: Self-pay | Admitting: Family Medicine

## 2024-06-22 DIAGNOSIS — Z01812 Encounter for preprocedural laboratory examination: Secondary | ICD-10-CM | POA: Diagnosis not present

## 2024-06-22 DIAGNOSIS — Z01818 Encounter for other preprocedural examination: Secondary | ICD-10-CM | POA: Diagnosis present

## 2024-06-22 LAB — TYPE AND SCREEN
ABO/RH(D): O POS
Antibody Screen: NEGATIVE

## 2024-06-22 LAB — CBC
HCT: 38.4 % (ref 36.0–46.0)
Hemoglobin: 13 g/dL (ref 12.0–15.0)
MCH: 30.6 pg (ref 26.0–34.0)
MCHC: 33.9 g/dL (ref 30.0–36.0)
MCV: 90.4 fL (ref 80.0–100.0)
Platelets: 218 K/uL (ref 150–400)
RBC: 4.25 MIL/uL (ref 3.87–5.11)
RDW: 13.1 % (ref 11.5–15.5)
WBC: 9.3 K/uL (ref 4.0–10.5)
nRBC: 0.2 % (ref 0.0–0.2)

## 2024-06-26 DIAGNOSIS — Z01818 Encounter for other preprocedural examination: Secondary | ICD-10-CM

## 2024-06-26 HISTORY — DX: Urinary tract infection, site not specified: N39.0

## 2024-07-02 DIAGNOSIS — F411 Generalized anxiety disorder: Secondary | ICD-10-CM | POA: Diagnosis not present

## 2024-07-03 DIAGNOSIS — H8110 Benign paroxysmal vertigo, unspecified ear: Secondary | ICD-10-CM

## 2024-07-03 HISTORY — DX: Benign paroxysmal vertigo, unspecified ear: H81.10

## 2024-07-08 ENCOUNTER — Encounter: Payer: Self-pay | Admitting: Neurology

## 2024-07-13 ENCOUNTER — Ambulatory Visit: Admitting: Family Medicine

## 2024-07-13 ENCOUNTER — Encounter: Payer: Self-pay | Admitting: Family Medicine

## 2024-07-13 VITALS — BP 118/80 | HR 96 | Temp 98.6°F | Resp 16 | Ht 63.0 in | Wt 200.0 lb

## 2024-07-13 DIAGNOSIS — R5383 Other fatigue: Secondary | ICD-10-CM

## 2024-07-13 DIAGNOSIS — R42 Dizziness and giddiness: Secondary | ICD-10-CM

## 2024-07-13 DIAGNOSIS — J329 Chronic sinusitis, unspecified: Secondary | ICD-10-CM

## 2024-07-13 DIAGNOSIS — Z79899 Other long term (current) drug therapy: Secondary | ICD-10-CM | POA: Diagnosis not present

## 2024-07-13 DIAGNOSIS — G4489 Other headache syndrome: Secondary | ICD-10-CM | POA: Diagnosis not present

## 2024-07-13 LAB — COMPREHENSIVE METABOLIC PANEL WITH GFR
AG Ratio: 2.2 (calc) (ref 1.0–2.5)
ALT: 9 U/L (ref 6–29)
AST: 12 U/L (ref 10–30)
Albumin: 4.6 g/dL (ref 3.6–5.1)
Alkaline phosphatase (APISO): 36 U/L (ref 31–125)
BUN: 9 mg/dL (ref 7–25)
CO2: 27 mmol/L (ref 20–32)
Calcium: 9.1 mg/dL (ref 8.6–10.2)
Chloride: 105 mmol/L (ref 98–110)
Creat: 0.74 mg/dL (ref 0.50–0.97)
Globulin: 2.1 g/dL (ref 1.9–3.7)
Glucose, Bld: 88 mg/dL (ref 65–99)
Potassium: 4.1 mmol/L (ref 3.5–5.3)
Sodium: 137 mmol/L (ref 135–146)
Total Bilirubin: 0.7 mg/dL (ref 0.2–1.2)
Total Protein: 6.7 g/dL (ref 6.1–8.1)
eGFR: 110 mL/min/1.73m2 (ref >=60)

## 2024-07-13 LAB — POC COVID19/FLU A&B COMBO
Covid Antigen, POC: NEGATIVE
Influenza A Antigen, POC: NEGATIVE
Influenza B Antigen, POC: NEGATIVE

## 2024-07-13 LAB — TSH: TSH: 2.29 m[IU]/L

## 2024-07-13 MED ORDER — METHYLPREDNISOLONE 4 MG PO TBPK: ORAL_TABLET | ORAL | 0 refills | Status: DC

## 2024-07-13 MED ORDER — MECLIZINE HCL 25 MG PO TABS: 12.5000 mg | ORAL_TABLET | Freq: Three times a day (TID) | ORAL | 2 refills | Status: AC | PRN

## 2024-07-13 NOTE — Progress Notes (Unsigned)
 Patient ID: Tracey Lindsey, female    DOB: 1991/02/11, 33 y.o.   MRN: [id number]  PCP: Leavy Mole, PA-C  Chief Complaint  Patient presents with   Dizziness    X1 week. Turned wrong in bed and has had vertigo sx since then.   Headache    Subjective:   Tracey Lindsey is a 33 y.o. female, presents to clinic with CC of the following:  HPI  Here for dizziness associated with rolling over in bed started about a week ago, hx of similar sx in the past, associated slight HA Reviewed chart previously had vertigo tx with meclizine   Patient Active Problem List   Diagnosis Date Noted   Dysesthesia affecting both sides of body 02/07/2021   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   Nocturia 08/05/2019   Insulin  resistance 02/18/2019   High risk medication use 01/24/2017   Other headache syndrome 07/24/2016   Obesity 11/19/2015   Verruca vulgaris 06/08/2015   Multiple sclerosis (HCC) 04/04/2015   Numbness 04/04/2015   Vitamin D  deficiency 04/04/2015   MS (multiple sclerosis) (HCC) 03/04/2015      Current Outpatient Medications:    ascorbic acid ([facility] VITAMIN C) 500 MG tablet, Take 500 mg by mouth daily., Disp: , Rfl:    busPIRone  (BUSPAR ) 10 MG tablet, Take 1 tablet (10 mg total) by mouth 2 (two) times daily., Disp: 180 tablet, Rfl: 1   Cholecalciferol (VITAMIN D3) 50 MCG (2000 UT) CAPS, Take 2,000 Units by mouth daily., Disp: , Rfl:    D-Mannose 500 MG CAPS, Take 1 capsule by mouth daily., Disp: , Rfl:    natalizumab  (TYSABRI ) 300 MG/15ML injection, Inject 15 mLs into the vein every 30 (thirty) days., Disp: , Rfl:    ondansetron  (ZOFRAN -ODT) 4 MG disintegrating tablet, Dissolve 1 tablet (4 mg total) in mouth every 8 (eight) hours as needed for nausea or vomiting., Disp: 10 tablet, Rfl: 0   Probiotic Product (PROBIOTIC DAILY PO), Take 1 tablet by mouth daily. Women's Probiotic, Disp: , Rfl:  No current facility-administered medications for this visit.  Facility-Administered Medications Ordered  in Other Visits:    gadopentetate dimeglumine  (MAGNEVIST ) injection 18 mL, 18 mL, Intravenous, Once PRN, Sater, Charlie LABOR, MD   No Known Allergies   Social History   Tobacco Use   Smoking status: Never   Smokeless tobacco: Never  Vaping Use   Vaping status: Never Used  Substance Use Topics   Alcohol use: Yes    Comment: social-  1-2 monthly   Drug use: Never      Chart Review Today: ***  Review of Systems     Objective:   Vitals:   07/13/24 1109  BP: 118/80  Pulse: 96  Resp: 16  Temp: 98.6 F (37 C)  SpO2: 97%  Weight: 200 lb (90.7 kg)  Height: 5' 3 (1.6 m)    Body mass index is 35.43 kg/m.  Physical Exam   Results for orders placed or performed during the hospital encounter of 06/22/24  CBC   Collection Time: 06/22/24  9:23 AM  Result Value Ref Range   WBC 9.3 4.0 - 10.5 K/uL   RBC 4.25 3.87 - 5.11 MIL/uL   Hemoglobin 13.0 12.0 - 15.0 g/dL   HCT 61.5 63.9 - 53.9 %   MCV 90.4 80.0 - 100.0 fL   MCH 30.6 26.0 - 34.0 pg   MCHC 33.9 30.0 - 36.0 g/dL   RDW 86.8 88.4 - 84.4 %   Platelets 218 150 -  400 K/uL   nRBC 0.2 0.0 - 0.2 %  Type and screen [facility]   Collection Time: 06/22/24  9:23 AM  Result Value Ref Range   ABO/RH(D) O POS    Antibody Screen NEG    Sample Expiration 07/06/2024,2359    Extend sample reason      NO TRANSFUSIONS OR PREGNANCY IN THE PAST 3 MONTHS Performed at [facility], [address], [place], [place] [address]        Assessment & Plan:   ***     Kevionna Heffler, PA-C 07/13/24 11:24 AM

## 2024-07-13 NOTE — Telephone Encounter (Signed)
 Pt is asking for a call from RN , she has been to her PCP and Covid has been ruled out, she is asking for a call to discuss the MS flare up

## 2024-07-13 NOTE — Patient Instructions (Signed)
 I have referred you to vestibular PT and please follow up with your neurologist. You can use the meclizine  meds as needed to help with the dizziness/vertigo symptoms.

## 2024-07-13 NOTE — Telephone Encounter (Signed)
 Spoke w/ Dr. Vear who agreed with referral to vestibular rehab and meclizine  prn. If she no longer has signs/sx if illness, also suggests steroid pack if pt agreeable.   I called pt at [phone]. LVM for pt to call office.

## 2024-07-13 NOTE — Telephone Encounter (Signed)
 Pt called back , informed that Nurse will call Pt BACK

## 2024-07-13 NOTE — Telephone Encounter (Signed)
 Sent message to MD, waiting on response on recommendation

## 2024-07-13 NOTE — Telephone Encounter (Signed)
 Called pt. She denies any current illness/infection. Feeling very fatigued, experiencing numbness on L side/weakness, headache. Agreeable to try steroid dose pak. [facility]. I went over directions with pt. Explained she should take 6 tabs on day 1 and decrease by one tablet each day until finished. She will call back if this is ineffective. She has Tysabri  infusion scheduled for 07/16/24. Aware ok to come as [place] as she has been fever free at least 24 hours and no signs of illness/infection.

## 2024-07-14 ENCOUNTER — Ambulatory Visit: Payer: Self-pay | Admitting: Family Medicine

## 2024-07-22 DIAGNOSIS — G35 Multiple sclerosis: Secondary | ICD-10-CM | POA: Diagnosis not present

## 2024-07-23 DIAGNOSIS — F411 Generalized anxiety disorder: Secondary | ICD-10-CM | POA: Diagnosis not present

## 2024-07-24 ENCOUNTER — Encounter: Payer: Self-pay | Admitting: Family Medicine

## 2024-08-06 DIAGNOSIS — F411 Generalized anxiety disorder: Secondary | ICD-10-CM | POA: Diagnosis not present

## 2024-08-10 DIAGNOSIS — R278 Other lack of coordination: Secondary | ICD-10-CM | POA: Diagnosis not present

## 2024-08-10 DIAGNOSIS — H81399 Other peripheral vertigo, unspecified ear: Secondary | ICD-10-CM | POA: Diagnosis not present

## 2024-08-10 DIAGNOSIS — R42 Dizziness and giddiness: Secondary | ICD-10-CM | POA: Diagnosis not present

## 2024-08-19 DIAGNOSIS — G35 Multiple sclerosis: Secondary | ICD-10-CM | POA: Diagnosis not present

## 2024-08-21 ENCOUNTER — Encounter (HOSPITAL_COMMUNITY): Payer: Self-pay | Admitting: Obstetrics and Gynecology

## 2024-08-24 ENCOUNTER — Encounter (HOSPITAL_COMMUNITY): Payer: Self-pay | Admitting: Obstetrics and Gynecology

## 2024-08-24 ENCOUNTER — Encounter (HOSPITAL_COMMUNITY): Payer: Self-pay

## 2024-08-24 NOTE — Progress Notes (Signed)
 Spoke w/ via phone for pre-op interview--- pt Lab needs dos----   cbc/ t&s/ upt      Lab results------ no COVID test -----patient states asymptomatic no test needed Arrive at ------- 0530 on 08-28-2024 NPO after MN w/ exception sips of water w/ meds Pre-Surgery Ensure or G2: n/a  Med rec completed Medications to take morning of surgery ----- buspar / if needed take zofran / meclizine  Diabetic medication ----- n/a  GLP1 agonist last dose: n/a GLP1 instructions:  Patient instructed no nail polish to be worn day of surgery Patient instructed to bring photo id and insurance card day of surgery Patient aware to have Driver (ride ) / caregiver    for 24 hours after surgery - mother, mary Moronta Patient Special Instructions ----- sent surgical instructions to patient in her mychart account.  Pt verbalized understanding of instructions and has number to call if any questions prior to surgery. Pt already has CHG soap since her surgery was rescheduled 06-26-2024 and had lab PAT visit was given at the time , pt stated she still has the soap.  Pre-Op special Instructions -----  n/a  Patient verbalized understanding of instructions that were given at this phone interview. Patient denies chest pain, sob, fever, cough at the interview.

## 2024-08-27 NOTE — Anesthesia Preprocedure Evaluation (Signed)
 Anesthesia Evaluation  Patient identified by MRN, date of birth, ID band Patient awake    Reviewed: Allergy & Precautions, NPO status , Patient's Chart, lab work & pertinent test results  Airway Mallampati: I  TM Distance: >3 FB Neck ROM: Full    Dental no notable dental hx. (+) Teeth Intact, Dental Advisory Given   Pulmonary neg pulmonary ROS   Pulmonary exam normal breath sounds clear to auscultation       Cardiovascular negative cardio ROS Normal cardiovascular exam Rhythm:Regular Rate:Normal     Neuro/Psych  Headaches PSYCHIATRIC DISORDERS Anxiety Depression     Neuromuscular disease (MS)    GI/Hepatic negative GI ROS, Neg liver ROS,,,  Endo/Other  negative endocrine ROS  PCOS  Renal/GU negative Renal ROS  negative genitourinary   Musculoskeletal negative musculoskeletal ROS (+)    Abdominal   Peds  Hematology negative hematology ROS (+)   Anesthesia Other Findings   Reproductive/Obstetrics                              Anesthesia Physical Anesthesia Plan  ASA: 2  Anesthesia Plan: General   Post-op Pain Management: Tylenol  PO (pre-op)*, Ketamine  IV* and Dilaudid IV   Induction: Intravenous  PONV Risk Score and Plan: 3 and Midazolam , Dexamethasone  and Ondansetron   Airway Management Planned: Oral ETT  Additional Equipment:   Intra-op Plan:   Post-operative Plan: Extubation in OR  Informed Consent: I have reviewed the patients History and Physical, chart, labs and discussed the procedure including the risks, benefits and alternatives for the proposed anesthesia with the patient or authorized representative who has indicated his/her understanding and acceptance.     Dental advisory given  Plan Discussed with: CRNA  Anesthesia Plan Comments: (2 IVs)         Anesthesia Quick Evaluation

## 2024-08-28 ENCOUNTER — Other Ambulatory Visit: Payer: Self-pay

## 2024-08-28 ENCOUNTER — Ambulatory Visit (HOSPITAL_COMMUNITY): Payer: Self-pay | Admitting: Anesthesiology

## 2024-08-28 ENCOUNTER — Encounter (HOSPITAL_COMMUNITY)
Admission: RE | Disposition: A | Discharge status: Home / Self Care | Payer: Self-pay | Source: Home / Self Care | Attending: Obstetrics and Gynecology

## 2024-08-28 ENCOUNTER — Ambulatory Visit (HOSPITAL_COMMUNITY)
Admission: RE | Admit: 2024-08-28 | Discharge: 2024-08-28 | Disposition: A | Discharge status: Home / Self Care | Attending: Obstetrics and Gynecology | Admitting: Obstetrics and Gynecology

## 2024-08-28 ENCOUNTER — Encounter (HOSPITAL_COMMUNITY): Payer: Self-pay | Admitting: Obstetrics and Gynecology

## 2024-08-28 ENCOUNTER — Ambulatory Visit (HOSPITAL_BASED_OUTPATIENT_CLINIC_OR_DEPARTMENT_OTHER): Payer: Self-pay | Admitting: Anesthesiology

## 2024-08-28 DIAGNOSIS — F32A Depression, unspecified: Secondary | ICD-10-CM | POA: Diagnosis not present

## 2024-08-28 DIAGNOSIS — N941 Unspecified dyspareunia: Secondary | ICD-10-CM | POA: Insufficient documentation

## 2024-08-28 DIAGNOSIS — D251 Intramural leiomyoma of uterus: Secondary | ICD-10-CM | POA: Insufficient documentation

## 2024-08-28 DIAGNOSIS — N920 Excessive and frequent menstruation with regular cycle: Secondary | ICD-10-CM | POA: Insufficient documentation

## 2024-08-28 DIAGNOSIS — Z79899 Other long term (current) drug therapy: Secondary | ICD-10-CM | POA: Diagnosis not present

## 2024-08-28 DIAGNOSIS — Z5331 Laparoscopic surgical procedure converted to open procedure: Secondary | ICD-10-CM | POA: Diagnosis not present

## 2024-08-28 DIAGNOSIS — R519 Headache, unspecified: Secondary | ICD-10-CM | POA: Diagnosis not present

## 2024-08-28 DIAGNOSIS — D250 Submucous leiomyoma of uterus: Secondary | ICD-10-CM | POA: Diagnosis present

## 2024-08-28 DIAGNOSIS — F419 Anxiety disorder, unspecified: Secondary | ICD-10-CM | POA: Insufficient documentation

## 2024-08-28 DIAGNOSIS — G35 Multiple sclerosis: Secondary | ICD-10-CM | POA: Insufficient documentation

## 2024-08-28 DIAGNOSIS — D271 Benign neoplasm of left ovary: Secondary | ICD-10-CM | POA: Diagnosis not present

## 2024-08-28 DIAGNOSIS — D252 Subserosal leiomyoma of uterus: Secondary | ICD-10-CM | POA: Diagnosis not present

## 2024-08-28 DIAGNOSIS — D259 Leiomyoma of uterus, unspecified: Secondary | ICD-10-CM | POA: Diagnosis not present

## 2024-08-28 DIAGNOSIS — Z01818 Encounter for other preprocedural examination: Secondary | ICD-10-CM

## 2024-08-28 DIAGNOSIS — Z90721 Acquired absence of ovaries, unilateral: Secondary | ICD-10-CM

## 2024-08-28 HISTORY — DX: Personal history of other infectious and parasitic diseases: Z86.19

## 2024-08-28 HISTORY — DX: Unspecified dyspareunia: N94.10

## 2024-08-28 HISTORY — PX: MYOMECTOMY: SHX85

## 2024-08-28 HISTORY — PX: LAPAROSCOPY: SHX197

## 2024-08-28 HISTORY — DX: Leiomyoma of uterus, unspecified: D25.9

## 2024-08-28 HISTORY — DX: Nocturia: R35.1

## 2024-08-28 HISTORY — DX: Frequency of micturition: R35.0

## 2024-08-28 HISTORY — DX: Generalized anxiety disorder: F41.1

## 2024-08-28 HISTORY — DX: Personal history of other diseases of the female genital tract: Z87.42

## 2024-08-28 HISTORY — PX: LAPAROTOMY: SHX154

## 2024-08-28 HISTORY — DX: Presence of spectacles and contact lenses: Z97.3

## 2024-08-28 LAB — CBC
HCT: 37.8 % (ref 36.0–46.0)
Hemoglobin: 12.9 g/dL (ref 12.0–15.0)
MCH: 30.9 pg (ref 26.0–34.0)
MCHC: 34.1 g/dL (ref 30.0–36.0)
MCV: 90.4 fL (ref 80.0–100.0)
Platelets: 177 K/uL (ref 150–400)
RBC: 4.18 MIL/uL (ref 3.87–5.11)
RDW: 12.4 % (ref 11.5–15.5)
WBC: 8.5 K/uL (ref 4.0–10.5)
nRBC: 0.2 % (ref 0.0–0.2)

## 2024-08-28 LAB — TYPE AND SCREEN
ABO/RH(D): O POS
Antibody Screen: NEGATIVE

## 2024-08-28 LAB — POCT PREGNANCY, URINE: Preg Test, Ur: NEGATIVE

## 2024-08-28 SURGERY — MYOMECTOMY, ABDOMINAL APPROACH
Anesthesia: General | Site: Pelvis

## 2024-08-28 MED ORDER — ACETAMINOPHEN 500 MG PO TABS
ORAL_TABLET | ORAL | Status: AC
  Filled 2024-08-28: qty 2

## 2024-08-28 MED ORDER — FENTANYL CITRATE (PF) 250 MCG/5ML IJ SOLN
INTRAMUSCULAR | Status: DC | PRN
  Administered 2024-08-28: 50 ug via INTRAVENOUS
  Administered 2024-08-28: 100 ug via INTRAVENOUS
  Administered 2024-08-28 (×2): 50 ug via INTRAVENOUS

## 2024-08-28 MED ORDER — PROPOFOL 10 MG/ML IV BOLUS
INTRAVENOUS | Status: AC
  Filled 2024-08-28: qty 20

## 2024-08-28 MED ORDER — AMISULPRIDE (ANTIEMETIC) 5 MG/2ML IV SOLN
10.0000 mg | Freq: Once | INTRAVENOUS | Status: AC | PRN
  Administered 2024-08-28: 10 mg via INTRAVENOUS

## 2024-08-28 MED ORDER — DEXTROSE IN LACTATED RINGERS 5 % IV SOLN: INTRAVENOUS | Status: DC

## 2024-08-28 MED ORDER — 0.9 % SODIUM CHLORIDE (POUR BTL) OPTIME
TOPICAL | Status: DC | PRN
  Administered 2024-08-28 (×2): 1000 mL

## 2024-08-28 MED ORDER — MIDAZOLAM HCL 2 MG/2ML IJ SOLN
INTRAMUSCULAR | Status: DC | PRN
  Administered 2024-08-28: 2 mg via INTRAVENOUS

## 2024-08-28 MED ORDER — DEXAMETHASONE SODIUM PHOSPHATE 10 MG/ML IJ SOLN
INTRAMUSCULAR | Status: AC
Start: 2024-08-28 — End: 2024-08-28
  Filled 2024-08-28: qty 1

## 2024-08-28 MED ORDER — ALBUMIN HUMAN 5 % IV SOLN: INTRAVENOUS | Status: DC | PRN

## 2024-08-28 MED ORDER — IBUPROFEN 600 MG PO TABS: 600.0000 mg | ORAL_TABLET | Freq: Four times a day (QID) | ORAL | 4 refills | Status: AC | PRN

## 2024-08-28 MED ORDER — ACETAMINOPHEN 500 MG PO TABS
1000.0000 mg | ORAL_TABLET | Freq: Four times a day (QID) | ORAL | Status: DC
  Administered 2024-08-28: 1000 mg via ORAL
  Filled 2024-08-28: qty 2

## 2024-08-28 MED ORDER — OXYCODONE HCL 5 MG/5ML PO SOLN: 5.0000 mg | Freq: Once | ORAL | Status: DC | PRN

## 2024-08-28 MED ORDER — PHENYLEPHRINE HCL-NACL 20-0.9 MG/250ML-% IV SOLN
INTRAVENOUS | Status: DC | PRN
  Administered 2024-08-28: 25 ug/min via INTRAVENOUS
  Administered 2024-08-28: 15 ug/min via INTRAVENOUS

## 2024-08-28 MED ORDER — ROCURONIUM BROMIDE 10 MG/ML (PF) SYRINGE
PREFILLED_SYRINGE | INTRAVENOUS | Status: AC
  Filled 2024-08-28: qty 10

## 2024-08-28 MED ORDER — POVIDONE-IODINE 10 % EX SWAB
2.0000 | Freq: Once | CUTANEOUS | Status: AC
  Administered 2024-08-28: 2 via TOPICAL

## 2024-08-28 MED ORDER — VASOPRESSIN 20 UNIT/ML IV SOLN
INTRAVENOUS | Status: AC
  Filled 2024-08-28: qty 1

## 2024-08-28 MED ORDER — SIMETHICONE 80 MG PO CHEW: 80.0000 mg | CHEWABLE_TABLET | Freq: Four times a day (QID) | ORAL | Status: DC | PRN

## 2024-08-28 MED ORDER — MENTHOL 3 MG MT LOZG: 1.0000 | LOZENGE | OROMUCOSAL | Status: DC | PRN

## 2024-08-28 MED ORDER — FENTANYL CITRATE (PF) 100 MCG/2ML IJ SOLN
25.0000 ug | INTRAMUSCULAR | Status: DC | PRN
  Administered 2024-08-28: 25 ug via INTRAVENOUS
  Administered 2024-08-28: 50 ug via INTRAVENOUS
  Administered 2024-08-28: 25 ug via INTRAVENOUS

## 2024-08-28 MED ORDER — ROCURONIUM BROMIDE 10 MG/ML (PF) SYRINGE
PREFILLED_SYRINGE | INTRAVENOUS | Status: DC | PRN
  Administered 2024-08-28: 60 mg via INTRAVENOUS
  Administered 2024-08-28: 20 mg via INTRAVENOUS

## 2024-08-28 MED ORDER — DEXAMETHASONE SODIUM PHOSPHATE 10 MG/ML IJ SOLN
INTRAMUSCULAR | Status: DC | PRN
  Administered 2024-08-28: 10 mg via INTRAVENOUS

## 2024-08-28 MED ORDER — SODIUM CHLORIDE 0.9 % IR SOLN
Status: DC | PRN
  Administered 2024-08-28: 1000 mL

## 2024-08-28 MED ORDER — LACTATED RINGERS IV SOLN: INTRAVENOUS | Status: DC

## 2024-08-28 MED ORDER — LIDOCAINE 2% (20 MG/ML) 5 ML SYRINGE
INTRAMUSCULAR | Status: DC | PRN
  Administered 2024-08-28: 60 mg via INTRAVENOUS

## 2024-08-28 MED ORDER — KETOROLAC TROMETHAMINE 30 MG/ML IJ SOLN
INTRAMUSCULAR | Status: AC
  Filled 2024-08-28: qty 1

## 2024-08-28 MED ORDER — ONDANSETRON HCL 4 MG/2ML IJ SOLN
INTRAMUSCULAR | Status: AC
  Filled 2024-08-28: qty 2

## 2024-08-28 MED ORDER — GLYCOPYRROLATE PF 0.2 MG/ML IJ SOSY
PREFILLED_SYRINGE | INTRAMUSCULAR | Status: DC | PRN
  Administered 2024-08-28: .2 mg via INTRAVENOUS

## 2024-08-28 MED ORDER — ACETAMINOPHEN 500 MG PO TABS
1000.0000 mg | ORAL_TABLET | Freq: Once | ORAL | Status: AC
  Administered 2024-08-28: 1000 mg via ORAL

## 2024-08-28 MED ORDER — CEFAZOLIN SODIUM-DEXTROSE 2-4 GM/100ML-% IV SOLN
INTRAVENOUS | Status: AC
  Filled 2024-08-28: qty 100

## 2024-08-28 MED ORDER — BUPIVACAINE HCL (PF) 0.25 % IJ SOLN
INTRAMUSCULAR | Status: DC | PRN
  Administered 2024-08-28: 2 mL
  Administered 2024-08-28: 6 mL

## 2024-08-28 MED ORDER — PANTOPRAZOLE SODIUM 40 MG PO TBEC
40.0000 mg | DELAYED_RELEASE_TABLET | Freq: Every day | ORAL | Status: DC
  Administered 2024-08-28: 40 mg via ORAL
  Filled 2024-08-28: qty 1

## 2024-08-28 MED ORDER — ROPIVACAINE HCL 5 MG/ML IJ SOLN
INTRAMUSCULAR | Status: AC
  Filled 2024-08-28: qty 30

## 2024-08-28 MED ORDER — KETAMINE HCL 50 MG/5ML IJ SOSY
PREFILLED_SYRINGE | INTRAMUSCULAR | Status: AC
  Filled 2024-08-28: qty 5

## 2024-08-28 MED ORDER — AMISULPRIDE (ANTIEMETIC) 5 MG/2ML IV SOLN
INTRAVENOUS | Status: AC
  Filled 2024-08-28: qty 4

## 2024-08-28 MED ORDER — FENTANYL CITRATE (PF) 250 MCG/5ML IJ SOLN
INTRAMUSCULAR | Status: AC
  Filled 2024-08-28: qty 5

## 2024-08-28 MED ORDER — SUGAMMADEX SODIUM 200 MG/2ML IV SOLN
INTRAVENOUS | Status: DC | PRN
  Administered 2024-08-28: 200 mg via INTRAVENOUS
  Administered 2024-08-28: 100 mg via INTRAVENOUS

## 2024-08-28 MED ORDER — ONDANSETRON HCL 4 MG PO TABS: 4.0000 mg | ORAL_TABLET | Freq: Four times a day (QID) | ORAL | Status: DC | PRN

## 2024-08-28 MED ORDER — FENTANYL CITRATE (PF) 100 MCG/2ML IJ SOLN
INTRAMUSCULAR | Status: AC
  Filled 2024-08-28: qty 2

## 2024-08-28 MED ORDER — ONDANSETRON HCL 4 MG/2ML IJ SOLN
INTRAMUSCULAR | Status: DC | PRN
  Administered 2024-08-28: 4 mg via INTRAVENOUS

## 2024-08-28 MED ORDER — IBUPROFEN 600 MG PO TABS
600.0000 mg | ORAL_TABLET | Freq: Four times a day (QID) | ORAL | Status: DC
  Administered 2024-08-28: 600 mg via ORAL
  Filled 2024-08-28: qty 1

## 2024-08-28 MED ORDER — ONDANSETRON HCL 4 MG/2ML IJ SOLN: 4.0000 mg | Freq: Four times a day (QID) | INTRAMUSCULAR | Status: DC | PRN

## 2024-08-28 MED ORDER — OXYCODONE HCL 5 MG PO TABS: 5.0000 mg | ORAL_TABLET | Freq: Four times a day (QID) | ORAL | 0 refills | Status: AC | PRN

## 2024-08-28 MED ORDER — MIDAZOLAM HCL 2 MG/2ML IJ SOLN
INTRAMUSCULAR | Status: AC
Start: 2024-08-28 — End: 2024-08-28
  Filled 2024-08-28: qty 2

## 2024-08-28 MED ORDER — LACTATED RINGERS IV SOLN: INTRAVENOUS | Status: DC | PRN

## 2024-08-28 MED ORDER — PROPOFOL 10 MG/ML IV BOLUS
INTRAVENOUS | Status: DC | PRN
  Administered 2024-08-28: 200 mg via INTRAVENOUS

## 2024-08-28 MED ORDER — LIDOCAINE 2% (20 MG/ML) 5 ML SYRINGE
INTRAMUSCULAR | Status: AC
  Filled 2024-08-28: qty 5

## 2024-08-28 MED ORDER — KETOROLAC TROMETHAMINE 30 MG/ML IJ SOLN
INTRAMUSCULAR | Status: DC | PRN
  Administered 2024-08-28: 30 mg via INTRAVENOUS

## 2024-08-28 MED ORDER — CEFAZOLIN SODIUM-DEXTROSE 2-4 GM/100ML-% IV SOLN
2.0000 g | INTRAVENOUS | Status: AC
Start: 2024-08-28 — End: 2024-08-28
  Administered 2024-08-28: 2 g via INTRAVENOUS

## 2024-08-28 MED ORDER — OXYCODONE HCL 5 MG PO TABS: 5.0000 mg | ORAL_TABLET | Freq: Once | ORAL | Status: DC | PRN

## 2024-08-28 MED ORDER — SODIUM CHLORIDE (PF) 0.9 % IJ SOLN
INTRAMUSCULAR | Status: AC
  Filled 2024-08-28: qty 150

## 2024-08-28 MED ORDER — KETAMINE HCL 50 MG/5ML IJ SOSY
PREFILLED_SYRINGE | INTRAMUSCULAR | Status: DC | PRN
  Administered 2024-08-28: 40 mg via INTRAVENOUS

## 2024-08-28 MED ORDER — ORAL CARE MOUTH RINSE: 15.0000 mL | Freq: Once | OROMUCOSAL | Status: AC

## 2024-08-28 MED ORDER — BUSPIRONE HCL 10 MG PO TABS
10.0000 mg | ORAL_TABLET | Freq: Two times a day (BID) | ORAL | Status: DC
  Filled 2024-08-28 (×2): qty 1

## 2024-08-28 MED ORDER — VASOPRESSIN 20 UNIT/ML IV SOLN
INTRAVENOUS | Status: DC | PRN
  Administered 2024-08-28: 6 mL

## 2024-08-28 MED ORDER — CHLORHEXIDINE GLUCONATE 0.12 % MT SOLN
OROMUCOSAL | Status: AC
  Filled 2024-08-28: qty 15

## 2024-08-28 MED ORDER — CHLORHEXIDINE GLUCONATE 0.12 % MT SOLN
15.0000 mL | Freq: Once | OROMUCOSAL | Status: AC
  Administered 2024-08-28: 15 mL via OROMUCOSAL

## 2024-08-28 MED ORDER — OXYCODONE HCL 5 MG PO TABS: 5.0000 mg | ORAL_TABLET | ORAL | Status: DC | PRN

## 2024-08-28 MED ORDER — BUPIVACAINE HCL (PF) 0.25 % IJ SOLN
INTRAMUSCULAR | Status: AC
  Filled 2024-08-28: qty 30

## 2024-08-28 SURGICAL SUPPLY — 53 items
BENZOIN TINCTURE PRP APPL 2/3 (GAUZE/BANDAGES/DRESSINGS) ×1 IMPLANT
BLADE SURG 10 STRL SS (BLADE) ×2 IMPLANT
CATH FOLEY 3WAY 5CC 16FR (CATHETERS) ×3 IMPLANT
COVER BACK TABLE 60X90IN (DRAPES) ×1 IMPLANT
COVER TIP SHEARS 8 DVNC (MISCELLANEOUS) ×2 IMPLANT
DEFOGGER SCOPE WARM SEASHARP (MISCELLANEOUS) ×3 IMPLANT
DERMABOND ADVANCED .7 DNX12 (GAUZE/BANDAGES/DRESSINGS) ×3 IMPLANT
DRAPE ARM DVNC X/XI (DISPOSABLE) ×8 IMPLANT
DRAPE COLUMN DVNC XI (DISPOSABLE) ×2 IMPLANT
DRAPE SURG IRRIG POUCH 19X23 (DRAPES) ×3 IMPLANT
DRAPE UTILITY XL STRL (DRAPES) ×3 IMPLANT
DRIVER NDL MEGA SUTCUT DVNCXI (INSTRUMENTS) ×2 IMPLANT
DRIVER NDLE MEGA SUTCUT DVNCXI (INSTRUMENTS) IMPLANT
DRSG OPSITE POSTOP 4X6 (GAUZE/BANDAGES/DRESSINGS) ×1 IMPLANT
DURAPREP 26ML APPLICATOR (WOUND CARE) ×3 IMPLANT
ELECTRODE REM PT RTRN 9FT ADLT (ELECTROSURGICAL) ×3 IMPLANT
FORCEPS BPLR LNG DVNC XI (INSTRUMENTS) IMPLANT
FORCEPS LONG TIP 8 DVNC XI (FORCEP) ×2 IMPLANT
GAUZE PACKING 1/2INX5YD STRL (GAUZE/BANDAGES/DRESSINGS) ×1 IMPLANT
GLOVE BIOGEL PI IND STRL 7.0 (GLOVE) ×15 IMPLANT
GLOVE ECLIPSE 6.5 STRL STRAW (GLOVE) ×9 IMPLANT
IRRIGATION STRYKERFLOW (MISCELLANEOUS) ×2 IMPLANT
KIT PINK PAD W/HEAD ARM REST (MISCELLANEOUS) ×3 IMPLANT
LEGGING LITHOTOMY PAIR STRL (DRAPES) IMPLANT
LIGASURE IMPACT 36 18CM CVD LR (INSTRUMENTS) ×1 IMPLANT
NDL INSUFFLATION 14GA 120MM (NEEDLE) ×2 IMPLANT
NDL SPNL 22GX7 QUINCKE BK (NEEDLE) ×2 IMPLANT
NEEDLE INSUFFLATION 14GA 120MM (NEEDLE) ×3 IMPLANT
NEEDLE SPNL 22GX7 QUINCKE BK (NEEDLE) ×3 IMPLANT
OBTURATOR OPTICALSTD 8 DVNC (TROCAR) ×3 IMPLANT
PACK ROBOT WH (CUSTOM PROCEDURE TRAY) ×3 IMPLANT
PACK ROBOTIC GOWN (GOWN DISPOSABLE) ×3 IMPLANT
PAD OB MATERNITY 11 LF (PERSONAL CARE ITEMS) ×3 IMPLANT
SCISSORS MNPLR CVD DVNC XI (INSTRUMENTS) ×2 IMPLANT
SEAL UNIV 5-12 XI (MISCELLANEOUS) ×9 IMPLANT
SEALER VESSEL EXT DVNC XI (MISCELLANEOUS) IMPLANT
SET TRI-LUMEN FLTR TB AIRSEAL (TUBING) ×3 IMPLANT
SHEARS HARMONIC FOCUS 9 REPROC (ELECTROSURGICAL) ×1 IMPLANT
SOLN STERILE WATER 1000 ML (IV SOLUTION) IMPLANT
SOLN STERILE WATER BTL 1000 ML (IV SOLUTION) ×2 IMPLANT
SPIKE FLUID TRANSFER (MISCELLANEOUS) ×3 IMPLANT
SUT MON AB 3-0 SH27 (SUTURE) ×1 IMPLANT
SUT VIC AB 0 CT1 18XCR BRD8 (SUTURE) ×1 IMPLANT
SUT VIC AB 0 CT1 27XBRD ANBCTR (SUTURE) ×3 IMPLANT
SUT VIC AB 2-0 CT1 TAPERPNT 27 (SUTURE) ×1 IMPLANT
SUT VIC AB 4-0 PS2 18 (SUTURE) ×6 IMPLANT
SUT VICRYL 0 TIES 12 18 (SUTURE) ×1 IMPLANT
SUT VLOC 180 0 9IN GS21 (SUTURE) IMPLANT
TIP UTERINE 6.7X10CM GRN DISP (MISCELLANEOUS) IMPLANT
TIP UTERINE 6.7X8CM BLUE DISP (MISCELLANEOUS) ×1 IMPLANT
TOWEL GREEN STERILE (TOWEL DISPOSABLE) ×3 IMPLANT
TROCAR PORT AIRSEAL 8X120 (TROCAR) ×3 IMPLANT
UNDERPAD 30X36 HEAVY ABSORB (UNDERPADS AND DIAPERS) ×3 IMPLANT

## 2024-08-28 NOTE — H&P (Signed)
 Tracey Lindsey is an 33 y.o. female. G1P0010 SF presents for surgical mgmt of symptomatic  uterine fibroid. Pt is scheduled for robotic myomectomy possible exp lap, myomectomy.  Pt has had heavy cycles. Sonogram  suggest large pedunculated uterine fibroids  Pertinent Gynecological History: Menses: menorrhagia Bleeding: menorrhagia Contraception: vasectomy DES exposure: denies Blood transfusions: none Sexually transmitted diseases: no past history Previous GYN Procedures: DNC  Last mammogram: n/a Date: n/a Last pap: 2025 Date: normal OB History: G1, P0010   Menstrual History: Menarche age: n/a Patient's last menstrual period was 08/26/2024 (approximate).    Past Medical History:  Diagnosis Date   BPPV (benign paroxysmal positional vertigo) 07/2024   Depression    Dyspareunia in female    Frequency of urination    GAD (generalized anxiety disorder)    History of abnormal cervical Papanicolaou smear    History of chlamydia    History of ovarian cyst    Nocturia more than twice per night    PCOS (polycystic ovarian syndrome)    Relapsing remitting multiple sclerosis 03/2015   neurologist--- dr sater/ amy lomax NP;  treated with tysabri  infusion q4wks   Uterine fibroid    intramural/ subserous   Vitamin D  deficiency    Wears glasses     Past Surgical History:  Procedure Laterality Date   NO PAST SURGERIES     WISDOM TOOTH EXTRACTION  2017    Family History  Problem Relation Age of Onset   Diabetes Father    Hypertension Father    Stroke Father    Cancer Mother        lung   Hyperlipidemia Mother    Lung disease Mother    Eczema Brother    Colon cancer Maternal Aunt    Other Maternal Grandmother    Cancer Maternal Grandmother    Breast cancer Neg Hx    Ovarian cancer Neg Hx     Social History:  reports that she has never smoked. She has never used smokeless tobacco. She reports current alcohol use. She reports that she does not use drugs.  Allergies: No  Known Allergies  Medications Prior to Admission  Medication Sig Dispense Refill Last Dose/Taking   ascorbic acid ([facility]S VITAMIN C) 500 MG tablet Take 500 mg by mouth daily.   Past Week   busPIRone  (BUSPAR ) 10 MG tablet Take 1 tablet (10 mg total) by mouth 2 (two) times daily. 180 tablet 1 08/27/2024 Evening   Cholecalciferol (VITAMIN D3) 50 MCG (2000 UT) CAPS Take 2,000 Units by mouth daily.   Past Week   D-Mannose 500 MG CAPS Take 1 capsule by mouth daily.   Past Month   ondansetron  (ZOFRAN -ODT) 4 MG disintegrating tablet Dissolve 1 tablet (4 mg total) in mouth every 8 (eight) hours as needed for nausea or vomiting. 10 tablet 0 Unknown   Probiotic Product (PROBIOTIC DAILY PO) Take 1 tablet by mouth daily. Women's Probiotic   Past Week   meclizine  (ANTIVERT ) 25 MG tablet Take 0.5-1 tablets (12.5-25 mg total) by mouth 3 (three) times daily as needed for dizziness. 30 tablet 2 Unknown   natalizumab  (TYSABRI ) 300 MG/15ML injection Inject 15 mLs into the vein every 30 (thirty) days.       Review of Systems  All other systems reviewed and are negative.   Height 5' 3 (1.6 m), weight 92.5 kg, last menstrual period 08/26/2024. Physical Exam Constitutional:      Appearance: Normal appearance. She is obese.  Eyes:  Extraocular Movements: Extraocular movements intact.  Abdominal:     Palpations: There is mass.     Comments: Soft  Uterus at umb  Genitourinary:    General: Normal vulva.     Comments: Vagina nl Cervix nulliparous Uterus 20 wk size Adnexa non palpable Musculoskeletal:        General: Normal range of motion.     Cervical back: Neck supple.  Skin:    General: Skin is warm and dry.  Neurological:     General: No focal deficit present.     Mental Status: She is alert and oriented to person, place, and time.  Psychiatric:        Mood and Affect: Mood normal.        Behavior: Behavior normal.     Results for orders placed or performed during the hospital encounter of  08/28/24 (from the past 24 hours)  Pregnancy, urine POC     Status: None   Collection Time: 08/28/24  6:00 AM  Result Value Ref Range   Preg Test, Ur NEGATIVE NEGATIVE    No results found.  Assessment/Plan: Menorrhagia  Intramural and subserous fibroid P) robotic myomectomy ? Exp lap, myomectomy. Procedure explained. Risk of surgery reviewed including infection, bleeding, possible need for blood transfusion and its risk, thermal injury, possible need for c/s in the future, up to 30 % chance of needing same surgery in the future. All ? answered  Tracey Lindsey 08/28/2024, 6:13 AM

## 2024-08-28 NOTE — Progress Notes (Signed)
   08/28/24 1835  Departure Condition  Departure Condition Good  Mobility at [employer]  Patient/Caregiver Teaching Teach Back Method Used;Pain management discussed;Discharge instructions reviewed;Prescriptions reviewed;Follow-up care reviewed;Patient/caregiver verbalized understanding;Medications discussed  Departure Mode With family  Was procedural sedation performed on this patient during this visit? Yes   Patient alert and oriented x4, VS and pain stable at discharge.

## 2024-08-28 NOTE — Anesthesia Postprocedure Evaluation (Signed)
 Anesthesia Post Note  Patient: Anetria Harwick Zemanek  Procedure(s) Performed: MYOMECTOMY, ABDOMINAL APPROACH (Pelvis) LAPAROSCOPY, DIAGNOSTIC (Pelvis) MINI LAPAROTOMY WITH LEFT OOPHORECTOMY (Abdomen)     Patient location during evaluation: PACU Anesthesia Type: General Level of consciousness: awake and alert and oriented Pain management: pain level controlled Vital Signs Assessment: post-procedure vital signs reviewed and stable Respiratory status: spontaneous breathing, nonlabored ventilation and respiratory function stable Cardiovascular status: blood pressure returned to baseline and stable Postop Assessment: no apparent nausea or vomiting Anesthetic complications: no   No notable events documented.  Last Vitals:  Vitals:   08/28/24 1115 08/28/24 1130  BP: 129/87 133/89  Pulse: (!) 101 (!) 111  Resp: 15 17  Temp:    SpO2: 100% 99%    Last Pain:  Vitals:   08/28/24 1131  TempSrc:   PainSc: 6                  Zula Hovsepian A.

## 2024-08-28 NOTE — Progress Notes (Signed)
 POD #0  Feels well Pain controlled Tol reg diet  O: Blood pressure 118/80, pulse 90, temperature 98.3 F (36.8 C), temperature source Oral, resp. rate 17, height 5' 3 (1.6 m), weight 92.5 kg, last menstrual period 08/26/2024, SpO2 100%.   Abd: soft nondistended Primary dressing stained with blood Pad  on cycle Extr. No edema  Intra-op findings reviewed  IMP. S/p dx laparoscopy, minilap, left oophorectomy, myomectomy Doing well. P) d/c home f/u 2 wk D/c instructions reveiwed

## 2024-08-28 NOTE — Interval H&P Note (Signed)
 History and Physical Interval Note:  08/28/2024 7:20 AM  Tracey Lindsey  has presented today for surgery, with the diagnosis of Intramural and subserous fibroid  Dyspareunia.  The various methods of treatment have been discussed with the patient and family. After consideration of risks, benefits and other options for treatment, the patient has consented to  Procedure(s): MYOMECTOMY, ROBOT-ASSISTED (N/A) possible exploratory laparotomy, myomectomy  as a surgical intervention.  The patient's history has been reviewed, patient examined, no change in status, stable for surgery.  I have reviewed the patient's chart and labs.  Questions were answered to the patient's satisfaction.     Halsey Persaud A Lemuel Boodram

## 2024-08-28 NOTE — Plan of Care (Signed)

## 2024-08-28 NOTE — Anesthesia Procedure Notes (Signed)
 Procedure Name: Intubation Date/Time: 08/28/2024 7:41 AM  Performed by: Jolynn Mage, CRNAPre-anesthesia Checklist: Patient identified, Patient being monitored, Timeout performed, Emergency Drugs available and Suction available Patient Re-evaluated:Patient Re-evaluated prior to induction Oxygen Delivery Method: Circle System Utilized Preoxygenation: Pre-oxygenation with 100% oxygen Induction Type: IV induction Ventilation: Mask ventilation without difficulty Laryngoscope Size: Miller and 2 Grade View: Grade I Tube type: Oral Tube size: 7.0 mm Number of attempts: 1 Airway Equipment and Method: Stylet Placement Confirmation: ETT inserted through vocal cords under direct vision, positive ETCO2 and breath sounds checked- equal and bilateral Secured at: 21 cm Tube secured with: Tape Dental Injury: Teeth and Oropharynx as per pre-operative assessment

## 2024-08-28 NOTE — Transfer of Care (Signed)
 Immediate Anesthesia Transfer of Care Note  Patient: Tracey Lindsey  Procedure(s) Performed: MYOMECTOMY, ABDOMINAL APPROACH (Pelvis) LAPAROSCOPY, DIAGNOSTIC (Pelvis) MINI LAPAROTOMY WITH LEFT OOPHORECTOMY (Abdomen)  Patient Location: PACU  Anesthesia Type:General  Level of Consciousness: drowsy, patient cooperative, and responds to stimulation  Airway & Oxygen Therapy: Patient Spontanous Breathing and Patient connected to face mask oxygen  Post-op Assessment: Report given to RN, Post -op Vital signs reviewed and stable, and Patient moving all extremities X 4  Post vital signs: Reviewed and stable  Last Vitals:  Vitals Value Taken Time  BP 142/85 08/28/24 10:06  Temp    Pulse 104 08/28/24 10:08  Resp 21 08/28/24 10:08  SpO2 100 % 08/28/24 10:08  Vitals shown include unfiled device data.  Last Pain:  Vitals:   08/28/24 0622  TempSrc: Oral  PainSc: 0-No pain      Patients Stated Pain Goal: 4 (08/28/24 0622)  Complications: No notable events documented.

## 2024-08-28 NOTE — Brief Op Note (Signed)
 08/28/2024  9:44 AM  PATIENT:  Tracey Lindsey  33 y.o. female  PRE-OPERATIVE DIAGNOSIS:  Intramural and subserous fibroid , menorrhagia Dyspareunia  POST-OPERATIVE DIAGNOSIS:  left ovarian fibroma, subserous fibroid, menorrhagia  PROCEDURE:  Diagnostic laparoscopy, minilaparotomy, left oophorectomy, myomectomy  SURGEON:  Surgeons and Role:    DEWAINE Rutherford Gain, MD - Primary  PHYSICIAN ASSISTANT:   ASSISTANTS: Daine Abbot RNFA   ANESTHESIA:   general Findings:  15 cm left ovarian mass, posterior SS fibroid, nl right ovary, posterior cul de sac and peritubal adhesions, nl right and left tube EBL:  25 mL   BLOOD ADMINISTERED:none  DRAINS: none   LOCAL MEDICATIONS USED:  MARCAINE      SPECIMEN:  Source of Specimen:  left ovary, fibroid  DISPOSITION OF SPECIMEN:  PATHOLOGY  COUNTS:  YES  TOURNIQUET:  * No tourniquets in log *  DICTATION: .Other Dictation: Dictation Number [id number]  PLAN OF CARE: Admit for overnight observation  PATIENT DISPOSITION:  PACU - hemodynamically stable.   Delay start of Pharmacological VTE agent (>24hrs) due to surgical blood loss or risk of bleeding: no

## 2024-08-28 NOTE — Discharge Instructions (Signed)
CALL  IF TEMP>100.4, NOTHING PER VAGINA X 2 WK, CALL IF SOAKING A MAXI  PAD EVERY HOUR OR MORE FREQUENTLY 

## 2024-08-28 NOTE — Op Note (Signed)
 NAME: Tracey Lindsey, Tracey Lindsey. MEDICAL RECORD NO: [id number] ACCOUNT NO: [id number] DATE OF BIRTH: [birth year] FACILITY: [facility] LOCATION: [facility] PHYSICIAN: Clarabel Marion A. Rutherford, MD  Operative Report   DATE OF PROCEDURE: 08/28/2024  PREOPERATIVE DIAGNOSES: 1.  Menorrhagia. 2.  Intramural and subserosal fibroid.  PROCEDURE:  Diagnostic laparoscopy, mini-laparotomy, left oophorectomy, abdominal myomectomy, and pelvic washings.  POSTOPERATIVE DIAGNOSES: 1.  Subserosal fibroid. 2.  Left ovarian fibroma. 3.  Menorrhagia.  ANESTHESIA:  General.  SURGEON:  Dickie Rutherford, MD  ASSISTANT:  Randine Abbot, RNFA  DESCRIPTION OF PROCEDURE:  Under adequate general anesthesia, the patient was placed in the dorsal lithotomy position.  She was sterilely prepped and draped in the usual fashion.  The patient was positioned for robotic surgery.  An indwelling Foley  catheter was sterilely placed.  A weighted speculum was placed in the vagina.  A Sims retractor was placed anteriorly.  The cervix was grasped with a single-tooth tenaculum.  The cervix was sounded to 9 cm.  The cervix was serially dilated to a 19 Pratt  dilator.  An 8-mm uterine manipulator was introduced into the uterus with a small RUMI cup.  The retractors were removed.  Examination under anesthesia prior to the start of the surgery was notable for a 16-week-sized mobile uterus with a palpable  anterior mass. Attention was then turned to the abdomen.  0.25% Marcaine  was injected supraumbilically.  A small supraumbilical incision was then made.  A Veress needle was introduced.  Opening pressure of 7 was noted.  2.9 L of CO2 was insufflated.  The Veress needle  was removed.  A robotic trocar was introduced into the abdomen.  The  lighted robotic camera was then introduced.  Inspection was notable for the omentum being diffused with gas and the normal liver edge.  There was a large white mass taking up the entire pelvic  cavity.  The patient was  placed in Trendelenburg position.  Panoramic inspection was notable for the right ovary being able to be seen.  The right tube was seen with some adhesions, there was posterior pelvic adhesions in the cul-de-sac.  The left tubal end could be seen at the proximal  part, but the left ovary was not seen and there was a posterior left fundal subserosal fibroid that was noted.  The mass did not appear to be arising from the uterus itself and appeared to be suspect for the left ovary.  Given the finding, the decision  was then made to forego robotic surgery and the ports were removed.  The patient remained in the Trendelenburg position.  The uterine manipulators were removed.  0.25% Marcaine  was injected along the planned mini Pfannenstiel skin incision, which was  subsequently made, carried down to the rectus fascia.  The rectus fascia was opened transversely.  The rectus fascia was then bluntly and sharply dissected off the rectus muscle in a superior and inferior fashion.  The rectus muscle was split in the  midline.  The parietal peritoneum was opened with the air subsequently removed.  400 mL of fluid was placed in the abdomen.  Pelvic washings were obtained.  The parietal peritoneum incision was extended.  The mass was grasped and brought up into the  field confirming slight torsion from the ovary itself, which was un-torsed.  Using the LigaSure and the Harmonics, the left mesovarian ligament was serially clamped, cauterized, and cut.  The ovary was removed and sent to Pathology for frozen section  given to determine if additional surgery was needed.  In the interim time, the periovarian bilaterally and the posterior cul-de-sac adhesions were lysed.  The uterus was brought up into the field.  The fibroid was grasped with a single-tooth tenaculum,  injected with dilute solution of Pitressin.  A vertical incision was made overlying the fibroid serosa.  The fibroid was enucleated from its base.  The base was  then closed with 0 Vicryl figure-of-eight sutures and the serosal surface approximated using  3-0 Monocryl baseball sutures.  The remaining pelvis was inspected.  The left tube was normal.  Additional adhesions in the periovarian and posterior cul-de-sac was continued to be lysed.  The abdomen was then irrigated and suctioned.  The frozen section  was notable for ovarian fibroma and at that point, Interceed was placed overlying the fibroid.  The abdomen was thoroughly irrigated and suctioned.  Good hemostasis was noted.  The parietal peritoneum was then closed with 2-0 Vicryl.  The rectus fascia  was closed with 0 Vicryl x 2.  The subcutaneous area was irrigated.  A small bleeder was cauterized.  Interrupted 2-0 plain suture was placed and the skin was approximated using 4-0 Vicryl suture.  Steri-Strips and benzoin was placed.  SPECIMEN:  Left ovary( sent for frozen), uterine fibroid, pelvic washings.  ESTIMATED BLOOD LOSS:  25 mL.  INTRAOPERATIVE FLUIDS:  1500 mL.  URINE OUTPUT:  600 mL.  COUNTS:  Sponge and instrument counts x 2 were correct.  COMPLICATIONS:  None.  CONDITION:  The patient tolerated the procedure well.  DISPOSITION:  She was transferred to the recovery room in stable condition.    MUK D: 08/28/2024 10:36:12 am T: 08/28/2024 11:04:00 pm  JOB: [id number]/ [id number]

## 2024-08-31 ENCOUNTER — Encounter (HOSPITAL_COMMUNITY): Payer: Self-pay | Admitting: Obstetrics and Gynecology

## 2024-09-01 LAB — CYTOLOGY - NON PAP

## 2024-09-01 LAB — SURGICAL PATHOLOGY

## 2024-09-10 DIAGNOSIS — F411 Generalized anxiety disorder: Secondary | ICD-10-CM | POA: Diagnosis not present

## 2024-09-14 ENCOUNTER — Other Ambulatory Visit: Payer: Self-pay

## 2024-09-14 DIAGNOSIS — G35D Multiple sclerosis, unspecified: Secondary | ICD-10-CM

## 2024-09-16 ENCOUNTER — Other Ambulatory Visit

## 2024-09-18 ENCOUNTER — Telehealth: Payer: Self-pay | Admitting: *Deleted

## 2024-09-18 ENCOUNTER — Other Ambulatory Visit: Payer: Self-pay

## 2024-09-18 DIAGNOSIS — G35A Relapsing-remitting multiple sclerosis: Secondary | ICD-10-CM | POA: Diagnosis not present

## 2024-09-18 DIAGNOSIS — G35D Multiple sclerosis, unspecified: Secondary | ICD-10-CM

## 2024-09-18 NOTE — Telephone Encounter (Signed)
 JCV [facility] to [facility].

## 2024-09-19 LAB — CBC WITH DIFFERENTIAL/PLATELET
Basophils Absolute: 0.1 x10E3/uL (ref 0.0–0.2)
Basos: 1 %
EOS (ABSOLUTE): 0.2 x10E3/uL (ref 0.0–0.4)
Eos: 2 %
Hematocrit: 39.4 % (ref 34.0–46.6)
Hemoglobin: 13.1 g/dL (ref 11.1–15.9)
Immature Grans (Abs): 0 x10E3/uL (ref 0.0–0.1)
Immature Granulocytes: 0 %
Lymphocytes Absolute: 3 x10E3/uL (ref 0.7–3.1)
Lymphs: 36 %
MCH: 31 pg (ref 26.6–33.0)
MCHC: 33.2 g/dL (ref 31.5–35.7)
MCV: 93 fL (ref 79–97)
Monocytes Absolute: 0.4 x10E3/uL (ref 0.1–0.9)
Monocytes: 5 %
Neutrophils Absolute: 4.7 x10E3/uL (ref 1.4–7.0)
Neutrophils: 56 %
Platelets: 234 x10E3/uL (ref 150–450)
RBC: 4.23 x10E6/uL (ref 3.77–5.28)
RDW: 12.7 % (ref 11.7–15.4)
WBC: 8.4 x10E3/uL (ref 3.4–10.8)

## 2024-09-22 LAB — RFLX STRATIFY JCV (TM) AB INHIBITION: JCV Antibody by Inhibition: NEGATIVE

## 2024-09-22 LAB — STRATIFY JCV AB (W/ INDEX) W/ RFLX
Index Value: 0.21 {index} — ABNORMAL HIGH
Stratify JCV (TM) Ab w/Reflex Inhibition: UNDETERMINED — AB

## 2024-09-24 DIAGNOSIS — F411 Generalized anxiety disorder: Secondary | ICD-10-CM | POA: Diagnosis not present

## 2024-09-29 NOTE — Telephone Encounter (Signed)
 10/212025: RESULT: JCV 0.21 Indeterminate JCV Antibody by Inhibition (NEGATIVE).

## 2024-10-08 DIAGNOSIS — F411 Generalized anxiety disorder: Secondary | ICD-10-CM | POA: Diagnosis not present

## 2024-10-20 DIAGNOSIS — G35A Relapsing-remitting multiple sclerosis: Secondary | ICD-10-CM | POA: Diagnosis not present

## 2024-10-22 DIAGNOSIS — F411 Generalized anxiety disorder: Secondary | ICD-10-CM | POA: Diagnosis not present

## 2024-11-05 DIAGNOSIS — F411 Generalized anxiety disorder: Secondary | ICD-10-CM | POA: Diagnosis not present

## 2024-11-18 ENCOUNTER — Ambulatory Visit: Admitting: Neurology

## 2024-11-18 ENCOUNTER — Encounter: Payer: Self-pay | Admitting: Neurology

## 2024-11-18 VITALS — BP 123/93 | HR 78 | Ht 63.0 in | Wt 214.0 lb

## 2024-11-18 DIAGNOSIS — G35A Relapsing-remitting multiple sclerosis: Secondary | ICD-10-CM

## 2024-11-18 DIAGNOSIS — E559 Vitamin D deficiency, unspecified: Secondary | ICD-10-CM | POA: Diagnosis not present

## 2024-11-18 DIAGNOSIS — R200 Anesthesia of skin: Secondary | ICD-10-CM

## 2024-11-18 DIAGNOSIS — M7552 Bursitis of left shoulder: Secondary | ICD-10-CM | POA: Diagnosis not present

## 2024-11-18 DIAGNOSIS — R39198 Other difficulties with micturition: Secondary | ICD-10-CM | POA: Diagnosis not present

## 2024-11-18 DIAGNOSIS — Z79899 Other long term (current) drug therapy: Secondary | ICD-10-CM

## 2024-11-18 MED ORDER — BETAMETHASONE SOD PHOS & ACET 6 (3-3) MG/ML IJ SUSP: 30.0000 mg | Freq: Once | INTRAMUSCULAR | Status: DC

## 2024-11-18 MED ORDER — BETAMETHASONE SOD PHOS & ACET 6 (3-3) MG/ML IJ SUSP: 6.0000 mg | Freq: Once | INTRAMUSCULAR | Status: AC

## 2024-11-18 NOTE — Progress Notes (Signed)
 [facility]  PATIENT: Tracey Lindsey DOB: 07-09-1991  REFERRING DOCTOR OR PCP:  [facility] Endo [place] Car) SOURCE: Patient and ER records and PACS images  _________________________________   HISTORICAL  CHIEF COMPLAINT:  Chief Complaint  Patient presents with   RM10/MS    Pt is here Alone. Pt states she has been doing okay. Pt states she has been having some episodes of Vertigo.     HISTORY OF PRESENT ILLNESS:  Tracey Lindsey is a 33 y.o. woman recently diagnosed with MS.     Update 11/18/2024 She is on Tysabri ,   she tolerates it well.  Her MS has been stable with no recent exacerbations or new neurologic symptoms.   She is JCV Ab negative 09/18/2024 (0.21).        Gait and balance are fine.   She can walk a couple miles.   She uses the bannister on stairs for safety but can go down without it..    She denies weakness.    She has had left arm pain with tingling.  This bothers her more at night but is present during the day.  Sleeping on her left side increases the pain.   Of note, she had an exacerbation with left sided numbness April 2016.   However, she completely improved and had no left sided numbness until more recently.   She denies neck pain but is uncomfortable if she lays on left side.     She notes improved frequency since myomectomy/oophrectomy.   She has nocturia 1-2 times nightly. .  She has no incontinence.  She denies hesitancy.    Two UTI since the last visit.   Vision is ok but she notes some blurriness in the distance.    This is symmetric.   Colors are symmetric.    She had an epiode of vertigo earlier this year and was prescribed a steroid pack with benefit.   She has fatigue daily but accomplishes most of what she needs to.   She works 2 jobs which can affect sleep.  She sleeps ok but occasioanlly has trouble falling back asleep when she wakes up in middle of the night.   She has more stress recently working 3 jobs and may cut back to  one.    She denies depression but has mid anxiety helped by Buspar .    Cognition is doing well.      MS History:    In retrospect, she had an episode of vertigo in 2014 with mild vision changes that improved.    In March 2016, she had vertigo that improved after a couple weeks.  In late April, she had  numbness in the left foot. When she woke up the next morning, she noted that the numbness was higher up the left arm and the next day it also involved the left hand and she went to the emergency room. In the emergency room, she had an MRI of the brain that showed white matter foci were some for multiple sclerosis.     IMAGING MRI of the brain with and without contrast 03/29/15 showed several posterior fossa lesions with one in the right middle cerebellar peduncle and several in the pons.   The middle cerebellar peduncle focus and 2 of the pontine foci enhanced. There are also several periventricular and deep white matter foci in the cerebral hemispheres.   MRI cervical spine shows one enhancing focus.  Repeat imaging of the brain and cervical spine November 2017  did not show any new lesions. The previously enhancing lesions were smaller and did not enhance.SABRA     MRI of the brain 02/17/2020 showed Scattered T2/FLAIR hyperintense foci in the hemispheres.  Though nonspecific, the foci are consistent with the diagnosis of multiple sclerosis.  None of the foci appear to be acute and there are no new lesions compared to the 2019 MRI.   There is a partially empty sella turcica, unchanged in appearance since 2019.  This could be incidental or be associated with elevated intracranial pressure.  MRi of the cervical spiine 02/17/2020 showed T2 hyperintense focus within the spinal cord to the left adjacent to C3-C4.  This was also present on the 2017 MRI and is consistent with a chronic demyelinating plaque associated with multiple sclerosis.  There are no degenerative changes noted  MRI of the brain and cervical spine  08/29/2021 unchanged compared to 02/17/2020  MRI brain and cervical spine 09/17/2023 showed no new lesions compared to 2022.     REVIEW OF SYSTEMS: Constitutional: No fevers, chills, sweats, or change in appetite.   She notes some fatigue. Eyes: No visual changes, double vision, eye pain Ear, nose and throat: No hearing loss, ear pain, nasal congestion, sore throat Cardiovascular: No chest pain, palpitations Respiratory:  No shortness of breath at rest or with exertion.   No wheezes GastrointestinaI: No nausea, vomiting, diarrhea, abdominal pain, fecal incontinence Genitourinary:  Notes frequency, nocturia (x2).   She has PCOS Musculoskeletal:  No neck pain, back pain Integumentary: No rash, pruritus, skin lesions Neurological: as above Psychiatric: as above  Endocrine: No palpitations, diaphoresis, change in appetite, change in weigh or increased thirst Hematologic/Lymphatic:  No anemia, purpura, petechiae. Allergic/Immunologic: No itchy/runny eyes, nasal congestion, recent allergic reactions, rashes  ALLERGIES: No Known Allergies  HOME MEDICATIONS:  Current Outpatient Medications:    ascorbic acid ([facility] VITAMIN C) 500 MG tablet, Take 500 mg by mouth daily., Disp: , Rfl:    busPIRone  (BUSPAR ) 10 MG tablet, Take 1 tablet (10 mg total) by mouth 2 (two) times daily., Disp: 180 tablet, Rfl: 1   Cholecalciferol (VITAMIN D3) 50 MCG (2000 UT) CAPS, Take 2,000 Units by mouth daily., Disp: , Rfl:    meclizine  (ANTIVERT ) 25 MG tablet, Take 0.5-1 tablets (12.5-25 mg total) by mouth 3 (three) times daily as needed for dizziness., Disp: 30 tablet, Rfl: 2   natalizumab  (TYSABRI ) 300 MG/15ML injection, Inject 15 mLs into the vein every 30 (thirty) days., Disp: , Rfl:    ondansetron  (ZOFRAN -ODT) 4 MG disintegrating tablet, Dissolve 1 tablet (4 mg total) in mouth every 8 (eight) hours as needed for nausea or vomiting., Disp: 10 tablet, Rfl: 0   Probiotic Product (PROBIOTIC DAILY PO), Take 1 tablet by  mouth daily. Women's Probiotic, Disp: , Rfl:    D-Mannose 500 MG CAPS, Take 1 capsule by mouth daily. (Patient not taking: Reported on 11/18/2024), Disp: , Rfl:    ibuprofen  (ADVIL ) 600 MG tablet, Take 1 tablet (600 mg total) by mouth every 6 (six) hours as needed. (Patient not taking: Reported on 11/18/2024), Disp: 30 tablet, Rfl: 4 No current facility-administered medications for this visit.  Facility-Administered Medications Ordered in Other Visits:    gadopentetate dimeglumine  (MAGNEVIST ) injection 18 mL, 18 mL, Intravenous, Once PRN, Farooq Petrovich, Charlie LABOR, MD  PAST MEDICAL HISTORY: Past Medical History:  Diagnosis Date   BPPV (benign paroxysmal positional vertigo) 07/2024   Depression    Dyspareunia in female    Frequency of urination  GAD (generalized anxiety disorder)    History of abnormal cervical Papanicolaou smear    History of chlamydia    History of ovarian cyst    Nocturia more than twice per night    PCOS (polycystic ovarian syndrome)    Relapsing remitting multiple sclerosis 03/2015   neurologist--- dr Gentri Guardado/ amy lomax NP;  treated with tysabri  infusion q4wks   Uterine fibroid    intramural/ subserous   Vitamin D  deficiency    Wears glasses     PAST SURGICAL HISTORY: Past Surgical History:  Procedure Laterality Date   LAPAROSCOPY  08/28/2024   Procedure: LAPAROSCOPY, DIAGNOSTIC;  Surgeon: Rutherford Gain, MD;  Location: [facility] OR;  Service: Gynecology;;   LAPAROTOMY  08/28/2024   Procedure: MINI LAPAROTOMY WITH LEFT OOPHORECTOMY;  Surgeon: Rutherford Gain, MD;  Location: [facility] OR;  Service: Gynecology;;   LEANORA  08/28/2024   Procedure: MYOMECTOMY, ABDOMINAL APPROACH;  Surgeon: Rutherford Gain, MD;  Location: [facility] OR;  Service: Gynecology;;   NO PAST SURGERIES     WISDOM TOOTH EXTRACTION  2017    FAMILY HISTORY: Family History  Problem Relation Age of Onset   Diabetes Father    Hypertension Father    Stroke Father    Cancer Mother        lung    Hyperlipidemia Mother    Lung disease Mother    Eczema Brother    Colon cancer Maternal Aunt    Other Maternal Grandmother    Cancer Maternal Grandmother    Breast cancer Neg Hx    Ovarian cancer Neg Hx     SOCIAL HISTORY:  Social History   Socioeconomic History   Marital status: Single    Spouse name: Not on file   Number of children: Not on file   Years of education: 18   Highest education level: Master's degree (e.g., MA, MS, MEng, MEd, MSW, MBA)  Occupational History   Occupation: Visual Merchandiser  Tobacco Use   Smoking status: Never   Smokeless tobacco: Never  Vaping Use   Vaping status: Never Used  Substance and Sexual Activity   Alcohol use: Yes    Comment: social-  1-2 monthly   Drug use: Never   Sexual activity: Yes    Partners: Male    Birth control/protection: None  Other Topics Concern   Not on file  Social History Narrative   Lives with mother    Drinks soda daily (8-10 ounces)   Social Drivers of Health   Tobacco Use: Low Risk (11/18/2024)   Patient History    Smoking Tobacco Use: Never    Smokeless Tobacco Use: Never    Passive Exposure: Not on file  Financial Resource Strain: Low Risk (02/04/2024)   Overall Financial Resource Strain (CARDIA)    Difficulty of Paying Living Expenses: Not hard at all  Food Insecurity: No Food Insecurity (02/04/2024)   Hunger Vital Sign    Worried About Running Out of Food in the Last Year: Never true    Ran Out of Food in the Last Year: Never true  Transportation Needs: No Transportation Needs (02/04/2024)   PRAPARE - Administrator, Civil Service (Medical): No    Lack of Transportation (Non-Medical): No  Physical Activity: Insufficiently Active (02/04/2024)   Exercise Vital Sign    Days of Exercise per Week: 1 day    Minutes of Exercise per Session: 30 min  Stress: No Stress Concern Present (02/04/2024)   [employer] of Occupational Health - Occupational  Stress Questionnaire    Feeling of  Stress : Only a little  Social Connections: Moderately Isolated (02/04/2024)   Social Connection and Isolation Panel    Frequency of Communication with Friends and Family: More than three times a week    Frequency of Social Gatherings with Friends and Family: Twice a week    Attends Religious Services: More than 4 times per year    Active Member of [employer] or Organizations: No    Attends Engineer, Structural: Not on file    Marital Status: Never married  Intimate Partner Violence: Not At Risk (11/22/2023)   Received from [facility]   Epic    Within the last year, have you been afraid of your partner or ex-partner?: No    Within the last year, have you been humiliated or emotionally abused in other ways by your partner or ex-partner?: No    Within the last year, have you been kicked, hit, slapped, or otherwise physically hurt by your partner or ex-partner?: No    Within the last year, have you been raped or forced to have any kind of sexual activity by your partner or ex-partner?: No  Depression (PHQ2-9): Medium Risk (07/13/2024)   Depression (PHQ2-9)    PHQ-2 Score: 5  Alcohol Screen: Low Risk (02/04/2024)   Alcohol Screen    Last Alcohol Screening Score (AUDIT): 1  Housing: Unknown (02/04/2024)   Housing Stability Vital Sign    Unable to Pay for Housing in the Last Year: No    Number of Times Moved in the Last Year: Not on file    Homeless in the Last Year: No  Utilities: Not on file  Health Literacy: Not on file     PHYSICAL EXAM  Vitals:   11/18/24 1358  BP: (!) 123/93  Pulse: 78  Weight: 214 lb (97.1 kg)  Height: 5' 3 (1.6 m)     Body mass index is 37.91 kg/m.   General: The patient is well-developed and well-nourished and in no acute distress.  She has tenderness over the left subacromial bursa.  Increased pain with elevation and external rotation.  No tenderness on the right  Neurologic Exam  Mental status: The patient is alert and oriented x 3 at the  time of the examination. The patient has apparent normal recent and remote memory, with an apparently normal attention span and concentration ability.   Speech is normal.  Cranial nerves:   Extraocular muscles are intact.  Pupils react normally.,  Color vision is normal.  Facial strength and sensation normal. Trapezius strength is strong..    Motor:  Muscle bulk is normal.   Muscle tone is normal.  Strength is 5/5 in the arms and legs.  Normal RAM in hands and legs.   Sensory:   She has normal touch and vibration sensation in the arms and legs except mild reduced touch on left hand vs right.    Coordination: Cerebellar testing reveals good finger-nose-finger bilaterally.  The heel-to-shin is performed well.  Gait and station: Station is normal.  Gait and tandem gait are normal.  Romberg is negative  Reflexes: Deep tendon reflexes are normal and symmetric.     DIAGNOSTIC DATA (LABS, IMAGING, TESTING) - I reviewed patient records, labs, notes, testing and imaging myself where available.  Lab Results  Component Value Date   WBC 8.4 09/18/2024   HGB 13.1 09/18/2024   HCT 39.4 09/18/2024   MCV 93 09/18/2024   PLT 234 09/18/2024  Component Value Date/Time   NA 137 07/13/2024 1150   NA 140 12/26/2022 1017   K 4.1 07/13/2024 1150   CL 105 07/13/2024 1150   CO2 27 07/13/2024 1150   GLUCOSE 88 07/13/2024 1150   BUN 9 07/13/2024 1150   BUN 8 12/26/2022 1017   CREATININE 0.74 07/13/2024 1150   CALCIUM 9.1 07/13/2024 1150   PROT 6.7 07/13/2024 1150   PROT 6.5 12/26/2022 1017   ALBUMIN  4.4 12/26/2022 1017   AST 12 07/13/2024 1150   ALT 9 07/13/2024 1150   ALKPHOS 51 12/26/2022 1017   BILITOT 0.7 07/13/2024 1150   BILITOT 0.3 12/26/2022 1017   GFRNONAA 118 08/09/2020 1446   GFRNONAA 114 05/30/2020 1512   GFRAA 136 08/09/2020 1446   GFRAA 132 05/30/2020 1512   No results found.    ASSESSMENT AND PLAN  Multiple sclerosis, relapsing-remitting  High risk medication  use  Vitamin D  deficiency  Urinary dysfunction  Arm numbness left  Subacromial bursitis of left shoulder joint   1.  Continue Tysabri .   She is JCV Ab negative.   No exacerbations or new MS lesions. 2.   Stay active and exercise as tolerated.  3.  Inject left subacromial bursa with 6 mg Celestone  in 2.5 cc Mracaine using sterile technique.  She tolerated the injections well with there were no complications.  Pain was better afterwards. 4.   Continue vitamin D  supplements.       5.  She will return to see me in 6 months for regular visits and call sooner if she has new or worsening symptoms.    Ebrima Ranta A. Vear, MD, PhD 11/18/2024, 2:34 PM Certified in Neurology, Clinical Neurophysiology, Sleep Medicine, Pain Medicine and Neuroimaging  [facility] [address] [place], [place] [address] [phone]

## 2024-11-19 DIAGNOSIS — F411 Generalized anxiety disorder: Secondary | ICD-10-CM | POA: Diagnosis not present

## 2024-11-23 DIAGNOSIS — G35A Relapsing-remitting multiple sclerosis: Secondary | ICD-10-CM | POA: Diagnosis not present

## 2024-11-24 ENCOUNTER — Encounter: Payer: Self-pay | Admitting: Neurology

## 2024-11-24 MED ORDER — PREDNISONE 10 MG PO TABS: ORAL_TABLET | ORAL | 0 refills | Status: AC

## 2024-11-24 NOTE — Telephone Encounter (Signed)
 I spoke to pt. She said that she has noted acute vertigo since Saturday, with L side weakness since Sunday, fatigue, head pressure, some tingling, numbness.  Had tysabri  11-23-2024. No incontinence, no illness.  She said that these are similar symptoms that she gets from MS exacerbation.   She is asking to steroids,  noted that medrol  dose pack 21 day back in 07/2024 6 day taper.  [facility] or [facility] tomorrow.  I relayed that Dr. Vear is out of office  this week.  Will forward to my work in provider.  She appreciated this.  I told her this may be addressed today or may be tomorrow.  She verbalized understanding.

## 2024-11-24 NOTE — Telephone Encounter (Signed)
 Pt called with deep concern , Pt is having MS  Flare up  and is concern because she had infusion  yesterday and is still in a lot of pain . Pt would like to speak to Nurse or MD about possible treatment option .

## 2025-03-16 ENCOUNTER — Ambulatory Visit: Admitting: Internal Medicine

## 2025-07-14 ENCOUNTER — Ambulatory Visit: Admitting: Neurology
